# Patient Record
Sex: Female | Born: 1970 | Race: White | Hispanic: No | Smoking: Never smoker
Health system: Southern US, Community
[De-identification: ages and names within clinical notes are randomized; demographics above are authoritative.]

## PROBLEM LIST (undated history)

## (undated) DIAGNOSIS — B019 Varicella without complication: Secondary | ICD-10-CM

## (undated) DIAGNOSIS — F419 Anxiety disorder, unspecified: Secondary | ICD-10-CM

## (undated) DIAGNOSIS — N200 Calculus of kidney: Secondary | ICD-10-CM

## (undated) DIAGNOSIS — R0602 Shortness of breath: Secondary | ICD-10-CM

## (undated) DIAGNOSIS — I471 Supraventricular tachycardia, unspecified: Secondary | ICD-10-CM

## (undated) DIAGNOSIS — I4719 Other supraventricular tachycardia: Secondary | ICD-10-CM

## (undated) DIAGNOSIS — F411 Generalized anxiety disorder: Secondary | ICD-10-CM

## (undated) DIAGNOSIS — M1990 Unspecified osteoarthritis, unspecified site: Secondary | ICD-10-CM

## (undated) DIAGNOSIS — T7840XA Allergy, unspecified, initial encounter: Secondary | ICD-10-CM

## (undated) DIAGNOSIS — Z8619 Personal history of other infectious and parasitic diseases: Secondary | ICD-10-CM

## (undated) DIAGNOSIS — R87629 Unspecified abnormal cytological findings in specimens from vagina: Secondary | ICD-10-CM

## (undated) HISTORY — DX: Calculus of kidney: N20.0

## (undated) HISTORY — DX: Allergy, unspecified, initial encounter: T78.40XA

## (undated) HISTORY — DX: Varicella without complication: B01.9

## (undated) HISTORY — DX: Unspecified abnormal cytological findings in specimens from vagina: R87.629

---
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
## 2015-03-15 ENCOUNTER — Ambulatory Visit: Admit: 2015-03-15 | Disposition: A | Discharge status: Home / Self Care | Payer: Self-pay | Admitting: General Practice

## 2015-11-28 ENCOUNTER — Ambulatory Visit (INDEPENDENT_AMBULATORY_CARE_PROVIDER_SITE_OTHER): Payer: 59 | Admitting: Nurse Practitioner

## 2015-11-28 ENCOUNTER — Encounter: Payer: Self-pay | Admitting: Nurse Practitioner

## 2015-11-28 VITALS — BP 130/80 | HR 90 | Temp 98.7°F | Ht 63.0 in | Wt 124.0 lb

## 2015-11-28 DIAGNOSIS — Z7689 Persons encountering health services in other specified circumstances: Secondary | ICD-10-CM

## 2015-11-28 DIAGNOSIS — Z87442 Personal history of urinary calculi: Secondary | ICD-10-CM | POA: Insufficient documentation

## 2015-11-28 DIAGNOSIS — Z7189 Other specified counseling: Secondary | ICD-10-CM

## 2015-11-28 DIAGNOSIS — Z0000 Encounter for general adult medical examination without abnormal findings: Secondary | ICD-10-CM | POA: Insufficient documentation

## 2015-11-28 DIAGNOSIS — L708 Other acne: Secondary | ICD-10-CM

## 2015-11-28 DIAGNOSIS — B009 Herpesviral infection, unspecified: Secondary | ICD-10-CM

## 2015-11-28 DIAGNOSIS — L709 Acne, unspecified: Secondary | ICD-10-CM | POA: Insufficient documentation

## 2015-11-28 MED ORDER — VALACYCLOVIR HCL 1 G PO TABS: 1000.0000 mg | ORAL_TABLET | Freq: Two times a day (BID) | ORAL | Status: DC

## 2015-11-28 NOTE — Assessment & Plan Note (Signed)
Pt has been seen by dermatology in past. Re-establishing with a new provider in Feb. Currently on doxycyline without help.

## 2015-11-28 NOTE — Patient Instructions (Signed)
Welcome to [facility]! Nice to meet you.   Happy New Year and follow up next year if you need anything or a physical exam.

## 2015-11-28 NOTE — Assessment & Plan Note (Signed)
Last episode 6 yrs ago. Will follow as needed

## 2015-11-28 NOTE — Assessment & Plan Note (Signed)
Chronic fever blisters. Valtrex helpful. Needs refills- sent to pharmacy. Will follow as needed.

## 2015-11-28 NOTE — Assessment & Plan Note (Signed)
Discussed acute and chronic issues. Reviewed health maintenance measures, PFSHx, and immunizations. Obtain routine records from [facility]. Labs done by [facility]

## 2015-11-28 NOTE — Progress Notes (Signed)
Patient ID: Betty Rodriguez, female    DOB: 1971-06-17  Age: 44 y.o. MRN: [id number]  CC: Establish Care   HPI [name] presents for CC of establishing care and CC of needing Valtrex refilled.   1) New Pt Info:   Immunizations-    Flu- UTD   Pap- 6/16 PAP   Mammogram 6/16  2) Chronic Problems-  Acne- Dr. Tyler Deis, Doxycyline, spironolactone- very sick    Seeing a new Dermatologist   Kidney stones- last episode 6 years ago   UTI's- in past   Allergies- [name] has a past medical history of Kidney stones; Chicken pox; and Allergy.   She has past surgical history that includes Tonsillectomy (1983).   Her family history includes Diabetes in her maternal grandfather and maternal grandmother; Heart disease in her maternal grandfather and maternal grandmother; Hyperlipidemia in her father; Hypertension in her father, maternal grandfather, and maternal grandmother; Stroke (age of onset: 63) in her maternal grandmother.She reports that she has never smoked. She does not have any smokeless tobacco history on file. She reports that she drinks alcohol. She reports that she does not use illicit drugs.  No outpatient prescriptions prior to visit.   No facility-administered medications prior to visit.    ROS Review of Systems  Constitutional: Negative for fever, chills, diaphoresis and fatigue.  Respiratory: Negative for chest tightness, shortness of breath and wheezing.   Cardiovascular: Negative for chest pain, palpitations and leg swelling.  Gastrointestinal: Negative for nausea, vomiting and diarrhea.  Skin: Negative for rash.  Neurological: Negative for dizziness, weakness, numbness and headaches.  Psychiatric/Behavioral: The patient is not nervous/anxious.    Objective:  BP 130/80 mmHg  Pulse 90  Temp(Src) 98.7 F (37.1 C) (Oral)  Ht 5' 3"  (1.6 m)  Wt 124 lb (56.246 kg)  BMI 21.97 kg/m2  LMP 11/26/2015 (Exact  Date)  Physical Exam  Constitutional: She is oriented to person, place, and time. She appears well-developed and well-nourished. No distress.  HENT:  Head: Normocephalic and atraumatic.  Right Ear: External ear normal.  Left Ear: External ear normal.  Acne  Eyes: Right eye exhibits no discharge. Left eye exhibits no discharge. No scleral icterus.  Cardiovascular: Normal rate, regular rhythm and normal heart sounds.  Exam reveals no gallop and no friction rub.   No murmur heard. Pulmonary/Chest: Effort normal and breath sounds normal. No respiratory distress. She has no wheezes. She has no rales. She exhibits no tenderness.  Neurological: She is alert and oriented to person, place, and time. No cranial nerve deficit. She exhibits normal muscle tone. Coordination normal.  Skin: Skin is warm and dry. No rash noted. She is not diaphoretic.  Psychiatric: She has a normal mood and affect. Her behavior is normal. Judgment and thought content normal.      Assessment & Plan:   [name] was seen today for establish care.  Diagnoses and all orders for this visit:  Encounter to establish care  HSV-1 (herpes simplex virus 1) infection  Other orders -     valACYclovir (VALTREX) 1000 MG tablet; Take 1 tablet (1,000 mg total) by mouth 2 (two) times daily. for 3 days as needed   I have changed Ms. Greaser valACYclovir. I am also having her maintain her JUNEL FE 1/20 and doxycycline.  Meds ordered this encounter  Medications  . JUNEL FE 1/20 1-20 MG-MCG tablet    Sig: Take 1 tablet by mouth daily.  Marland Kitchen  DISCONTD: valACYclovir (VALTREX) 1000 MG tablet    Sig: 1,000 mg. 1 tab twice daily per day for 3 days as needed  . doxycycline (DORYX) 100 MG EC tablet    Sig: Take 100 mg by mouth daily.  . valACYclovir (VALTREX) 1000 MG tablet    Sig: Take 1 tablet (1,000 mg total) by mouth 2 (two) times daily. for 3 days as needed    Dispense:  20 tablet    Refill:  2    Order Specific Question:   Supervising Provider    Answer:  Crecencio Mc [[id number]]     Follow-up: Return if symptoms worsen or fail to improve.

## 2015-11-28 NOTE — Progress Notes (Signed)
Pre visit review using our clinic review tool, if applicable. No additional management support is needed unless otherwise documented below in the visit note. 

## 2016-02-13 ENCOUNTER — Encounter: Payer: Self-pay | Admitting: Nurse Practitioner

## 2016-02-13 ENCOUNTER — Ambulatory Visit (INDEPENDENT_AMBULATORY_CARE_PROVIDER_SITE_OTHER): Payer: 59 | Admitting: Nurse Practitioner

## 2016-02-13 VITALS — BP 132/84 | HR 100 | Temp 98.9°F | Wt 123.0 lb

## 2016-02-13 DIAGNOSIS — L708 Other acne: Secondary | ICD-10-CM

## 2016-02-13 DIAGNOSIS — F411 Generalized anxiety disorder: Secondary | ICD-10-CM | POA: Diagnosis not present

## 2016-02-13 MED ORDER — BUSPIRONE HCL 7.5 MG PO TABS: 7.5000 mg | ORAL_TABLET | Freq: Three times a day (TID) | ORAL | Status: DC

## 2016-02-13 NOTE — Patient Instructions (Signed)
See you in 4 weeks.

## 2016-02-13 NOTE — Progress Notes (Signed)
Pre visit review using our clinic review tool, if applicable. No additional management support is needed unless otherwise documented below in the visit note. 

## 2016-02-13 NOTE — Progress Notes (Signed)
Patient ID: Betty Rodriguez, female    DOB: May 12, 1971  Age: 45 y.o. MRN: [id number]  CC: Anxiety   HPI Betty Rodriguez presents for CC of anxiety x 6 months.   1) Pt reports seeing a dermatologist for a new treatment  Rosacea fulminans  Starting in April on Isotretoin   2) Anxiety pt started a new job 6 months ago and has increased stress with panic attacks daily x 4 months with heart racing, irritability, excessive worry/fear, lasting 15 minutes to 1 hr.  Treatment to date: Nothing [place] once a month to a spa   Using natural remedies like relaxation techniques   Sleep has been problematic- trouble falling and staying asleep    Working 12-13 hrs 3rd shift 5 days a week   History Betty Rodriguez has a past medical history of Kidney stones; Chicken pox; and Allergy.   She has past surgical history that includes Tonsillectomy (1983).   Her family history includes Diabetes in her maternal grandfather and maternal grandmother; Heart disease in her maternal grandfather and maternal grandmother; Hyperlipidemia in her father; Hypertension in her father, maternal grandfather, and maternal grandmother; Stroke (age of onset: 87) in her maternal grandmother.She reports that she has never smoked. She does not have any smokeless tobacco history on file. She reports that she drinks alcohol. She reports that she does not use illicit drugs.  Outpatient Prescriptions Prior to Visit  Medication Sig Dispense Refill  . JUNEL FE 1/20 1-20 MG-MCG tablet Take 1 tablet by mouth daily.    . valACYclovir (VALTREX) 1000 MG tablet Take 1 tablet (1,000 mg total) by mouth 2 (two) times daily. for 3 days as needed 20 tablet 2  . doxycycline (DORYX) 100 MG EC tablet Take 100 mg by mouth daily.     No facility-administered medications prior to visit.    ROS Review of Systems  Constitutional: Negative for fever, chills, diaphoresis and fatigue.  Respiratory: Negative for chest tightness, shortness of breath  and wheezing.   Cardiovascular: Negative for chest pain, palpitations and leg swelling.  Gastrointestinal: Negative for nausea, vomiting and diarrhea.  Skin: Negative for rash.  Neurological: Negative for dizziness, weakness, numbness and headaches.  Psychiatric/Behavioral: Positive for sleep disturbance. The patient is nervous/anxious.     Objective:  BP 132/84 mmHg  Pulse 100  Temp(Src) 98.9 F (37.2 C) (Oral)  Wt 123 lb (55.792 kg)  SpO2 99%  LMP 01/30/2016  Physical Exam  Constitutional: She is oriented to person, place, and time. She appears well-developed and well-nourished. No distress.  HENT:  Head: Normocephalic and atraumatic.  Right Ear: External ear normal.  Left Ear: External ear normal.  Cardiovascular: Normal rate, regular rhythm and normal heart sounds.   Pulmonary/Chest: Effort normal and breath sounds normal. No respiratory distress. She has no wheezes. She has no rales. She exhibits no tenderness.  Neurological: She is alert and oriented to person, place, and time.  Skin: Skin is warm and dry. No rash noted. She is not diaphoretic.  Cystic acne of face was diagnosed Rosacea fulminans  Psychiatric: She has a normal mood and affect. Her behavior is normal. Judgment and thought content normal.   Assessment & Plan:   Islam was seen today for anxiety.  Diagnoses and all orders for this visit:  Other acne  Generalized anxiety disorder  Other orders -     busPIRone (BUSPAR) 7.5 MG tablet; Take 1 tablet (7.5 mg total) by mouth 3 (three) times daily.  I have discontinued Ms.  Dsouza's doxycycline. I am also having her start on busPIRone. Additionally, I am having her maintain her JUNEL FE 1/20 and valACYclovir.  Meds ordered this encounter  Medications  . busPIRone (BUSPAR) 7.5 MG tablet    Sig: Take 1 tablet (7.5 mg total) by mouth 3 (three) times daily.    Dispense:  90 tablet    Refill:  0    Order Specific Question:  Supervising Provider    Answer:   Crecencio Mc [[id number]]     Follow-up: Return in about 4 weeks (around 03/12/2016) for Fu on medications .

## 2016-02-21 DIAGNOSIS — F411 Generalized anxiety disorder: Secondary | ICD-10-CM | POA: Insufficient documentation

## 2016-02-21 NOTE — Assessment & Plan Note (Signed)
New problem to me After discussion of anxiety- likely job related- Pt would like to move forward with buspirone slowly upped to 3 x daily.  FU in 4 weeks   Discussed risks, benefits, potential side effects

## 2016-02-21 NOTE — Assessment & Plan Note (Signed)
New treatment starting soon  Rosacea fulminans was diagnosed  Pt looking forward to this

## 2016-03-10 ENCOUNTER — Other Ambulatory Visit: Payer: Self-pay | Admitting: Nurse Practitioner

## 2016-03-10 NOTE — Telephone Encounter (Signed)
Rx refill sent to pharmacy. 

## 2016-03-12 ENCOUNTER — Encounter: Payer: Self-pay | Admitting: Nurse Practitioner

## 2016-03-12 ENCOUNTER — Ambulatory Visit (INDEPENDENT_AMBULATORY_CARE_PROVIDER_SITE_OTHER): Payer: 59 | Admitting: Nurse Practitioner

## 2016-03-12 VITALS — BP 122/66 | HR 78 | Temp 98.1°F | Resp 12 | Ht 63.0 in | Wt 124.8 lb

## 2016-03-12 DIAGNOSIS — F411 Generalized anxiety disorder: Secondary | ICD-10-CM

## 2016-03-12 DIAGNOSIS — L708 Other acne: Secondary | ICD-10-CM

## 2016-03-12 MED ORDER — BUSPIRONE HCL 10 MG PO TABS
10.0000 mg | ORAL_TABLET | Freq: Three times a day (TID) | ORAL | Status: DC
Start: 2016-03-12 — End: 2016-04-06

## 2016-03-12 NOTE — Progress Notes (Signed)
Patient ID: Betty Rodriguez, female    DOB: [birth year]  Age: 45 y.o. MRN: [id number]  CC: Follow-up   HPI [facility] [place] presents for follow up of medications.   1) Drying of skin, no other affects. Started on Accutane with dermatologist  2) Decreasing in anxiety within the last week  Not more fatigued than usual  Patient was last seen on 02/13/2016 by myself to discuss anxiety medication Her job stress has improved slightly since the last visit She has opted to 3 times daily and would like to try the next dosage up   History Camaryn has a past medical history of Kidney stones; Chicken pox; and Allergy.   She has past surgical history that includes Tonsillectomy (1983).   Her family history includes Diabetes in her maternal grandfather and maternal grandmother; Heart disease in her maternal grandfather and maternal grandmother; Hyperlipidemia in her father; Hypertension in her father, maternal grandfather, and maternal grandmother; Stroke (age of onset: 21) in her maternal grandmother.She reports that she has never smoked. She does not have any smokeless tobacco history on file. She reports that she drinks alcohol. She reports that she does not use illicit drugs.  Outpatient Prescriptions Prior to Visit  Medication Sig Dispense Refill  . JUNEL FE 1/20 1-20 MG-MCG tablet Take 1 tablet by mouth daily.    . valACYclovir (VALTREX) 1000 MG tablet Take 1 tablet (1,000 mg total) by mouth 2 (two) times daily. for 3 days as needed 20 tablet 2  . busPIRone (BUSPAR) 7.5 MG tablet TAKE 1 TABLET (7.5 MG TOTAL) BY MOUTH 3 (THREE) TIMES DAILY. 90 tablet 0   No facility-administered medications prior to visit.    ROS Review of Systems  Constitutional: Negative for fever, chills, diaphoresis, activity change, appetite change, fatigue and unexpected weight change.  Eyes: Negative for visual disturbance.  Respiratory: Negative for chest tightness and shortness of breath.   Cardiovascular:  Negative for chest pain.  Gastrointestinal: Negative for nausea, vomiting and diarrhea.  Neurological: Negative for headaches.  Psychiatric/Behavioral: Negative for suicidal ideas and sleep disturbance. The patient is nervous/anxious.     Objective:  BP 122/66 mmHg  Pulse 78  Temp(Src) 98.1 F (36.7 C) (Oral)  Resp 12  Ht 5' 3"  (1.6 m)  Wt 124 lb 12.8 oz (56.609 kg)  BMI 22.11 kg/m2  SpO2 98%  LMP 01/30/2016  Physical Exam  Constitutional: She is oriented to person, place, and time. She appears well-developed and well-nourished. No distress.  HENT:  Head: Normocephalic and atraumatic.  Right Ear: External ear normal.  Left Ear: External ear normal.  Cardiovascular: Normal rate, regular rhythm and intact distal pulses.   Pulmonary/Chest: Effort normal and breath sounds normal. No respiratory distress. She has no wheezes. She has no rales. She exhibits no tenderness.  Neurological: She is alert and oriented to person, place, and time.  Skin: Skin is warm and dry. No rash noted. She is not diaphoretic.  Psychiatric: She has a normal mood and affect. Her behavior is normal. Judgment and thought content normal.  Patient had good eye contact, dressed appropriately, not tearful during today's visit   Assessment & Plan:   Merelyn was seen today for follow-up.  Diagnoses and all orders for this visit:  Other acne  Generalized anxiety disorder  Other orders -     busPIRone (BUSPAR) 10 MG tablet; Take 1 tablet (10 mg total) by mouth 3 (three) times daily.   I have discontinued Ms. Bonneville busPIRone. I am also  having her start on busPIRone. Additionally, I am having her maintain her JUNEL FE 1/20, valACYclovir, and ISOtretinoin.  Meds ordered this encounter  Medications  . ISOtretinoin (ACCUTANE) 40 MG capsule    Sig: Take 40 mg by mouth daily.  . busPIRone (BUSPAR) 10 MG tablet    Sig: Take 1 tablet (10 mg total) by mouth 3 (three) times daily.    Dispense:  90 tablet     Refill:  0    Order Specific Question:  Supervising Provider    Answer:  Crecencio Mc [[id number]]     Follow-up: Return if symptoms worsen or fail to improve.

## 2016-03-12 NOTE — Progress Notes (Signed)
Pre visit review using our clinic review tool, if applicable. No additional management support is needed unless otherwise documented below in the visit note. 

## 2016-03-12 NOTE — Patient Instructions (Addendum)
Try the new dosage up to 3 times daily.   Follow up via mychart to see if this is helpful.

## 2016-03-22 NOTE — Assessment & Plan Note (Addendum)
Buspirone was up to 10 mg from 7.5 mg asked her to slowly titrate back up to 3 times daily as needed Follow-up as needed Asked her to sign up for my chart and send a message about how she's doing

## 2016-03-22 NOTE — Assessment & Plan Note (Addendum)
Patient has started on Accutane by dermatologist Diagnosis rosacea fulminans Patient has the dryness and irritation, but feels that it has improved in inflammation. She will continue to follow-up with dermatology

## 2016-04-02 ENCOUNTER — Encounter: Payer: Self-pay | Admitting: Nurse Practitioner

## 2016-04-06 ENCOUNTER — Other Ambulatory Visit: Payer: Self-pay | Admitting: Nurse Practitioner

## 2016-04-06 NOTE — Telephone Encounter (Signed)
Patient stated working well needs refill. Please advise?

## 2016-04-07 ENCOUNTER — Other Ambulatory Visit: Payer: Self-pay | Admitting: Nurse Practitioner

## 2016-04-07 MED ORDER — BUSPIRONE HCL 10 MG PO TABS: 10.0000 mg | ORAL_TABLET | Freq: Three times a day (TID) | ORAL | Status: DC

## 2016-04-08 ENCOUNTER — Other Ambulatory Visit: Payer: Self-pay | Admitting: Nurse Practitioner

## 2016-06-15 ENCOUNTER — Other Ambulatory Visit: Payer: Self-pay | Admitting: Obstetrics & Gynecology

## 2016-06-15 DIAGNOSIS — N63 Unspecified lump in unspecified breast: Secondary | ICD-10-CM

## 2016-06-15 DIAGNOSIS — Z1239 Encounter for other screening for malignant neoplasm of breast: Secondary | ICD-10-CM

## 2016-06-15 DIAGNOSIS — Z1231 Encounter for screening mammogram for malignant neoplasm of breast: Secondary | ICD-10-CM

## 2016-06-23 ENCOUNTER — Inpatient Hospital Stay
Admission: RE | Admit: 2016-06-23 | Discharge: 2016-06-23 | Disposition: A | Discharge status: Home / Self Care | Payer: Self-pay | Source: Ambulatory Visit | Attending: *Deleted | Admitting: *Deleted

## 2016-06-23 ENCOUNTER — Other Ambulatory Visit: Payer: Self-pay | Admitting: *Deleted

## 2016-06-23 DIAGNOSIS — Z9289 Personal history of other medical treatment: Secondary | ICD-10-CM

## 2016-06-24 ENCOUNTER — Ambulatory Visit
Admission: RE | Admit: 2016-06-24 | Discharge: 2016-06-24 | Disposition: A | Discharge status: Home / Self Care | Payer: 59 | Source: Ambulatory Visit | Attending: Obstetrics & Gynecology | Admitting: Obstetrics & Gynecology

## 2016-06-24 DIAGNOSIS — N63 Unspecified lump in unspecified breast: Secondary | ICD-10-CM

## 2016-06-24 DIAGNOSIS — Z1239 Encounter for other screening for malignant neoplasm of breast: Secondary | ICD-10-CM | POA: Diagnosis present

## 2016-06-25 ENCOUNTER — Inpatient Hospital Stay
Admission: RE | Admit: 2016-06-25 | Discharge: 2016-06-25 | Disposition: A | Discharge status: Home / Self Care | Payer: Self-pay | Source: Ambulatory Visit | Attending: *Deleted | Admitting: *Deleted

## 2016-06-25 ENCOUNTER — Other Ambulatory Visit: Payer: Self-pay | Admitting: *Deleted

## 2016-06-25 DIAGNOSIS — Z9289 Personal history of other medical treatment: Secondary | ICD-10-CM

## 2016-06-30 ENCOUNTER — Ambulatory Visit: Payer: 59

## 2016-06-30 ENCOUNTER — Other Ambulatory Visit: Payer: Self-pay | Admitting: Obstetrics & Gynecology

## 2016-06-30 DIAGNOSIS — N6310 Unspecified lump in the right breast, unspecified quadrant: Secondary | ICD-10-CM

## 2016-07-06 ENCOUNTER — Ambulatory Visit
Admission: RE | Admit: 2016-07-06 | Discharge: 2016-07-06 | Disposition: A | Discharge status: Home / Self Care | Payer: 59 | Source: Ambulatory Visit | Attending: Obstetrics & Gynecology | Admitting: Obstetrics & Gynecology

## 2016-07-06 DIAGNOSIS — N6310 Unspecified lump in the right breast, unspecified quadrant: Secondary | ICD-10-CM

## 2016-07-06 DIAGNOSIS — N6021 Fibroadenosis of right breast: Secondary | ICD-10-CM | POA: Insufficient documentation

## 2016-07-06 DIAGNOSIS — N63 Unspecified lump in breast: Secondary | ICD-10-CM | POA: Diagnosis present

## 2016-07-06 HISTORY — PX: BREAST BIOPSY: SHX20

## 2016-07-07 LAB — SURGICAL PATHOLOGY

## 2016-10-14 ENCOUNTER — Other Ambulatory Visit: Payer: Self-pay | Admitting: Nurse Practitioner

## 2016-10-14 NOTE — Telephone Encounter (Signed)
Last OV with Betty Rodriguez on 03/12/16. Has not established care with another provider; no appt set up.

## 2016-11-11 ENCOUNTER — Other Ambulatory Visit: Payer: Self-pay | Admitting: Family Medicine

## 2016-11-11 NOTE — Telephone Encounter (Signed)
Pt was a pt of Lorane Gell, NP. Pt has been taking Buspirone and was last refilled on 10/14/16. Pt has had no labs done or no future appt to est care with new PCP. Pt was last seen by Lorane Gell on 11/28/15.

## 2016-12-09 ENCOUNTER — Other Ambulatory Visit: Payer: Self-pay | Admitting: Family Medicine

## 2017-01-02 ENCOUNTER — Other Ambulatory Visit: Payer: Self-pay | Admitting: Family Medicine

## 2017-03-06 ENCOUNTER — Other Ambulatory Visit: Payer: Self-pay | Admitting: Family Medicine

## 2017-04-06 ENCOUNTER — Other Ambulatory Visit: Payer: Self-pay | Admitting: Family Medicine

## 2017-04-07 NOTE — Telephone Encounter (Signed)
Refilled: 01/04/17 Last OV: 03/12/16 Last Labs: none Future OV: 04/13/17 Please advise?

## 2017-04-13 ENCOUNTER — Ambulatory Visit: Payer: 59 | Admitting: Family

## 2017-04-14 ENCOUNTER — Encounter: Payer: Self-pay | Admitting: Family

## 2017-04-15 NOTE — Telephone Encounter (Signed)
Left message for patient to return call back.  

## 2017-04-19 ENCOUNTER — Encounter: Payer: Self-pay | Admitting: Family

## 2017-04-19 ENCOUNTER — Ambulatory Visit (INDEPENDENT_AMBULATORY_CARE_PROVIDER_SITE_OTHER): Payer: 59 | Admitting: Family

## 2017-04-19 VITALS — BP 158/84 | HR 80 | Temp 98.1°F | Resp 16 | Ht 63.25 in | Wt 113.1 lb

## 2017-04-19 DIAGNOSIS — B009 Herpesviral infection, unspecified: Secondary | ICD-10-CM

## 2017-04-19 DIAGNOSIS — I10 Essential (primary) hypertension: Secondary | ICD-10-CM

## 2017-04-19 DIAGNOSIS — F411 Generalized anxiety disorder: Secondary | ICD-10-CM

## 2017-04-19 DIAGNOSIS — I159 Secondary hypertension, unspecified: Secondary | ICD-10-CM | POA: Diagnosis not present

## 2017-04-19 MED ORDER — SERTRALINE HCL 50 MG PO TABS: 50.0000 mg | ORAL_TABLET | Freq: Every day | ORAL | 3 refills | Status: DC

## 2017-04-19 MED ORDER — AMLODIPINE BESYLATE 2.5 MG PO TABS: 2.5000 mg | ORAL_TABLET | Freq: Every day | ORAL | 3 refills | Status: DC

## 2017-04-19 MED ORDER — VALACYCLOVIR HCL 1 G PO TABS: 1000.0000 mg | ORAL_TABLET | Freq: Two times a day (BID) | ORAL | 2 refills | Status: DC

## 2017-04-19 NOTE — Assessment & Plan Note (Signed)
Uncontrolled. 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 Stop buspar. Trial zoloft. Advised otc melatonin and unisom. F/u 6-8 weeks.

## 2017-04-19 NOTE — Assessment & Plan Note (Signed)
Stable. Refilled medication.

## 2017-04-19 NOTE — Addendum Note (Signed)
Addended by: Leeanne Rio on: 04/19/2017 04:20 PM   Modules accepted: Orders

## 2017-04-19 NOTE — Assessment & Plan Note (Signed)
Elevated. No headache today. Patient and I jointly decided to go ahead and start low-dose amlodipine. Patient will follow-up in 6-8 weeks. Sooner, if headache turns, and advised patient to let me know asap.

## 2017-04-19 NOTE — Patient Instructions (Addendum)
Melatonin- no more than 1 to 3 mg should be used, taken 30 minutes prior to the desired onset of sleep   Unisom OTC  Trial zoloft - take before sleep  Follow up 6-8 weeks  Let me know about sleep study and if you want to pursue

## 2017-04-19 NOTE — Progress Notes (Signed)
Subjective:    Patient ID: Betty Rodriguez, female    DOB: 1971/06/21, 46 y.o.   MRN: [id number]  CC: Betty Rodriguez is a 46 y.o. female who presents today for follow up.   HPI: Anxiety- for past 5-6 years. has been on  Buspar 'helped some in the beginning.' has panic attacks. No depression . Works 3rd shift and job is very stressful. Cannot sleep, very erratic, and wakes up thinking about work. approx 4-5 hours sleep per day. avoiding social situations. No thoughts of hurting herself or anyone else.   Told she snores. Declines sleep study at this time.   HTN- has been checking at work because had a few headaches; taken BP during HA which was 136/82, 145/89. Describes HA as across forehead and 'pressure'; no photophobia, vision changes, chest pain during HA. Denies exertional chest pain or pressure, numbness or tingling radiating to left arm or jaw, palpitations, dizziness, frequent headaches, changes in vision, or shortness of breath.  Not eating more salt. Intentionally lost weight due to healthier eating.     Pap June 2017- had been with [facility].    Needs refill for valtrex for occasional cold sore.        HISTORY:  Past Medical History:  Diagnosis Date  . Allergy   . Chicken pox   . Kidney stones    Past Surgical History:  Procedure Laterality Date  . BREAST BIOPSY Right 07/06/2016   us/ core  . TONSILLECTOMY  1983   Family History  Problem Relation Age of Onset  . Hyperlipidemia Father   . Hypertension Father   . Diabetes Maternal Grandmother   . Hypertension Maternal Grandmother   . Heart disease Maternal Grandmother   . Stroke Maternal Grandmother 31  . Diabetes Maternal Grandfather   . Hypertension Maternal Grandfather   . Heart disease Maternal Grandfather     Allergies: Sulfa antibiotics Current Outpatient Prescriptions on File Prior to Visit  Medication Sig Dispense Refill  . JUNEL FE 1/20 1-20 MG-MCG tablet Take 1 tablet by mouth daily.      No current facility-administered medications on file prior to visit.     Social History  Substance Use Topics  . Smoking status: Never Smoker  . Smokeless tobacco: Never Used  . Alcohol use 0.0 oz/week     Comment: Rare     Review of Systems  Constitutional: Negative for chills and fever.  Eyes: Negative for visual disturbance.  Respiratory: Negative for cough.   Cardiovascular: Negative for chest pain and palpitations.  Gastrointestinal: Negative for nausea and vomiting.  Neurological: Positive for headaches. Negative for dizziness.  Psychiatric/Behavioral: Positive for sleep disturbance. Negative for suicidal ideas. The patient is nervous/anxious.       Objective:    BP (!) 158/84   Pulse 80   Temp 98.1 F (36.7 C) (Oral)   Resp 16   Ht 5' 3.25" (1.607 m)   Wt 113 lb 2 oz (51.3 kg)   SpO2 98%   BMI 19.88 kg/m  BP Readings from Last 3 Encounters:  04/19/17 (!) 158/84  03/12/16 122/66  02/13/16 132/84   Wt Readings from Last 3 Encounters:  04/19/17 113 lb 2 oz (51.3 kg)  03/12/16 124 lb 12.8 oz (56.6 kg)  02/13/16 123 lb (55.8 kg)    Physical Exam  Constitutional: She appears well-developed and well-nourished.  Eyes: Conjunctivae are normal.  Cardiovascular: Normal rate, regular rhythm, normal heart sounds and normal pulses.   Pulmonary/Chest: Effort normal  and breath sounds normal. She has no wheezes. She has no rhonchi. She has no rales.  Neurological: She is alert.  Skin: Skin is warm and dry.  Psychiatric: She has a normal mood and affect. Her speech is normal and behavior is normal. Thought content normal.  Vitals reviewed.      Assessment & Plan:   Problem List Items Addressed This Visit      Cardiovascular and Mediastinum   Hypertension - Primary    Elevated. No headache today. Patient and I jointly decided to go ahead and start low-dose amlodipine. Patient will follow-up in 6-8 weeks. Sooner, if headache turns, and advised patient to let me  know asap.       Relevant Medications   amLODipine (NORVASC) 2.5 MG tablet   Other Relevant Orders   Basic metabolic panel     Other   HSV-1 (herpes simplex virus 1) infection    Stable. Refilled medication.       Relevant Medications   valACYclovir (VALTREX) 1000 MG tablet   Generalized anxiety disorder    Uncontrolled. 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 Stop buspar. Trial zoloft. Advised otc melatonin and unisom. F/u 6-8 weeks.       Relevant Medications   sertraline (ZOLOFT) 50 MG tablet       I have discontinued Ms. Tassin ISOtretinoin, busPIRone, and busPIRone. I am also having her start on amLODipine and sertraline. Additionally, I am having her maintain her JUNEL FE 1/20, loratadine, fluticasone, and valACYclovir.   Meds ordered this encounter  Medications  . loratadine (CLARITIN REDITABS) 10 MG dissolvable tablet    Sig: Take 10 mg by mouth daily.  . fluticasone (FLONASE) 50 MCG/ACT nasal spray    Sig: Place 1 spray into both nostrils daily.  Marland Kitchen amLODipine (NORVASC) 2.5 MG tablet    Sig: Take 1 tablet (2.5 mg total) by mouth daily.    Dispense:  90 tablet    Refill:  3    Order Specific Question:   Supervising Provider    Answer:   Deborra Medina L [[id number]]  . sertraline (ZOLOFT) 50 MG tablet    Sig: Take 1 tablet (50 mg total) by mouth at bedtime.    Dispense:  90 tablet    Refill:  3    Order Specific Question:   Supervising Provider    Answer:   Deborra Medina L [[id number]]  . valACYclovir (VALTREX) 1000 MG tablet    Sig: Take 1 tablet (1,000 mg total) by mouth 2 (two) times daily. for 3 days as needed    Dispense:  20 tablet    Refill:  2    Order Specific Question:   Supervising Provider    Answer:   Crecencio Mc [[id number]]    Return precautions given.   Risks, benefits, and alternatives of the medications and treatment plan prescribed today were discussed, and patient expressed understanding.   Education regarding symptom management and diagnosis  given to patient on AVS.  Continue to follow with Burnard Hawthorne, FNP for routine health maintenance.   [facility] and I agreed with plan.   Mable Paris, FNP

## 2017-04-20 ENCOUNTER — Encounter: Payer: Self-pay | Admitting: Emergency Medicine

## 2017-04-20 ENCOUNTER — Observation Stay
Admission: EM | Admit: 2017-04-20 | Discharge: 2017-04-21 | Disposition: A | Discharge status: Home / Self Care | Payer: 59 | Attending: Internal Medicine | Admitting: Internal Medicine

## 2017-04-20 DIAGNOSIS — F411 Generalized anxiety disorder: Secondary | ICD-10-CM | POA: Diagnosis present

## 2017-04-20 DIAGNOSIS — I10 Essential (primary) hypertension: Secondary | ICD-10-CM | POA: Diagnosis present

## 2017-04-20 DIAGNOSIS — I471 Supraventricular tachycardia, unspecified: Secondary | ICD-10-CM

## 2017-04-20 DIAGNOSIS — Z882 Allergy status to sulfonamides status: Secondary | ICD-10-CM | POA: Diagnosis not present

## 2017-04-20 DIAGNOSIS — T43225A Adverse effect of selective serotonin reuptake inhibitors, initial encounter: Secondary | ICD-10-CM | POA: Diagnosis not present

## 2017-04-20 DIAGNOSIS — T782XXA Anaphylactic shock, unspecified, initial encounter: Secondary | ICD-10-CM

## 2017-04-20 DIAGNOSIS — Y92009 Unspecified place in unspecified non-institutional (private) residence as the place of occurrence of the external cause: Secondary | ICD-10-CM | POA: Insufficient documentation

## 2017-04-20 DIAGNOSIS — E876 Hypokalemia: Secondary | ICD-10-CM | POA: Diagnosis not present

## 2017-04-20 DIAGNOSIS — Z87442 Personal history of urinary calculi: Secondary | ICD-10-CM | POA: Insufficient documentation

## 2017-04-20 DIAGNOSIS — Z79899 Other long term (current) drug therapy: Secondary | ICD-10-CM | POA: Insufficient documentation

## 2017-04-20 DIAGNOSIS — Z8249 Family history of ischemic heart disease and other diseases of the circulatory system: Secondary | ICD-10-CM | POA: Insufficient documentation

## 2017-04-20 DIAGNOSIS — R000 Tachycardia, unspecified: Secondary | ICD-10-CM

## 2017-04-20 HISTORY — DX: Anxiety disorder, unspecified: F41.9

## 2017-04-20 HISTORY — DX: Supraventricular tachycardia, unspecified: I47.10

## 2017-04-20 HISTORY — DX: Supraventricular tachycardia: I47.1

## 2017-04-20 LAB — BASIC METABOLIC PANEL
Anion gap: 10 (ref 5–15)
BUN: 15 mg/dL (ref 6–20)
CO2: 22 mmol/L (ref 22–32)
Calcium: 8.9 mg/dL (ref 8.9–10.3)
Chloride: 102 mmol/L (ref 101–111)
Creatinine, Ser: 0.81 mg/dL (ref 0.44–1.00)
GFR calc Af Amer: >60 mL/min (ref >60)
GFR calc non Af Amer: >60 mL/min (ref >60)
Glucose, Bld: 223 mg/dL — ABNORMAL HIGH (ref 65–99)
Potassium: 3.3 mmol/L — ABNORMAL LOW (ref 3.5–5.1)
Sodium: 134 mmol/L — ABNORMAL LOW (ref 135–145)

## 2017-04-20 LAB — CBC WITH DIFFERENTIAL/PLATELET
Basophils Absolute: 0 10*3/uL (ref 0–0.1)
Basophils Relative: 0 %
Eosinophils Absolute: 0 10*3/uL (ref 0–0.7)
Eosinophils Relative: 0 %
HCT: 38.4 % (ref 35.0–47.0)
Hemoglobin: 13.4 g/dL (ref 12.0–16.0)
Lymphocytes Relative: 17 %
Lymphs Abs: 1.3 10*3/uL (ref 1.0–3.6)
MCH: 30.7 pg (ref 26.0–34.0)
MCHC: 34.8 g/dL (ref 32.0–36.0)
MCV: 88.1 fL (ref 80.0–100.0)
Monocytes Absolute: 0.2 10*3/uL (ref 0.2–0.9)
Monocytes Relative: 2 %
Neutro Abs: 6.3 10*3/uL (ref 1.4–6.5)
Neutrophils Relative %: 81 %
Platelets: 258 10*3/uL (ref 150–440)
RBC: 4.37 MIL/uL (ref 3.80–5.20)
RDW: 12.9 % (ref 11.5–14.5)
WBC: 7.8 10*3/uL (ref 3.6–11.0)

## 2017-04-20 MED ORDER — SODIUM CHLORIDE 0.9 % IV BOLUS (SEPSIS)
1000.0000 mL | Freq: Once | INTRAVENOUS | Status: AC
  Administered 2017-04-20: 1000 mL via INTRAVENOUS

## 2017-04-20 MED ORDER — LORAZEPAM 2 MG/ML IJ SOLN
1.0000 mg | Freq: Once | INTRAMUSCULAR | Status: AC
  Administered 2017-04-20: 1 mg via INTRAVENOUS

## 2017-04-20 MED ORDER — LORAZEPAM 2 MG/ML IJ SOLN
INTRAMUSCULAR | Status: AC
  Filled 2017-04-20: qty 1

## 2017-04-20 MED ORDER — LORAZEPAM 2 MG/ML IJ SOLN
INTRAMUSCULAR | Status: AC
  Administered 2017-04-20: 1 mg via INTRAVENOUS
  Filled 2017-04-20: qty 1

## 2017-04-20 NOTE — ED Notes (Signed)
Pts HR 160, MD Schaevitz informed, verbal order for 1 mg ativan given. Pt A/OX4, resp clear.

## 2017-04-20 NOTE — ED Notes (Addendum)
Pt's HR shot up to 160s, EDP notified and VO received for ativan 77m IV.  Pt skin red and blotching, pt diaphoretic and labored breathing at this time.  Pt states and family member at bedside states that pt has history of anxiety.

## 2017-04-20 NOTE — ED Notes (Addendum)
Pt reports to ED w/ c/o rash, rapid HR and "feeling off".  Pt sts that she was put on two new medications, zoloft and amlodipine. Pt sts that she took 2 doses of zoloft and 1 of amlodipine.  Pt sts that she developed rash to back/chest and went to urgent care. Sts that she was given shot of benadryl and steroids. Sts that rash and "funy feeling" decr after urgent care visit but returned PTA.  Pt also sts felt like throat "tight", resp even and unlabored and able to speak in full sentences w/ issue

## 2017-04-20 NOTE — ED Triage Notes (Signed)
Patient ambulatory to triage with steady gait, without difficulty or distress noted; pt st rx zoloft and amlodipine yesterday by PCP; today went to urgent care for ?reaction; rx steroids and benadryl injection at 730pm; c/o persistent "tingling sensation and throat feels tight"; pt tachycardic in triage 140's, reports her HR was 150 at urgent care as well

## 2017-04-20 NOTE — ED Provider Notes (Signed)
[facility] Emergency Department Provider Note  ____________________________________________   First MD Initiated Contact with Patient 04/20/17 2140     (approximate)  I have reviewed the triage vital signs and the nursing notes.   HISTORY  Chief Complaint Allergic Reaction   HPI Flynn Lininger Gravelle is a 46 y.o. female Who was recently prescribed Zoloft as well as amlodipine yesterday who began having a rash tonight at about 7:30 as well as tingling in her throat. She went to an urgent care where they diagnosed her with an allergic reaction. She was found to have heart rate of about 150 an urgent care. She was given a shot of steroids as well as Benadryl. Upon discharge, she was given a prescription for prednisone. She also took a dose of Pepcid prior to arrival. She said that she was observed less than an hour at urgent care. However, once discharged she felt a continued "tingling" in her throat and that her throat "felt tight." She then came to the emergency department for further evaluation. She described the rash as "hives" on her back.   Past Medical History:  Diagnosis Date  . Allergy   . Chicken pox   . Kidney stones     Patient Active Problem List   Diagnosis Date Noted  . Hypertension 04/19/2017  . Generalized anxiety disorder 02/21/2016  . Encounter to establish care 11/28/2015  . 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 History of nephrolithiasis 11/28/2015  . Acne 11/28/2015    Past Surgical History:  Procedure Laterality Date  . BREAST BIOPSY Right 07/06/2016   us/ core  . TONSILLECTOMY  1983    Prior to Admission medications   Medication Sig Start Date End Date Taking? Authorizing Provider  amLODipine (NORVASC) 2.5 MG tablet Take 1 tablet (2.5 mg total) by mouth daily. 04/19/17   Burnard Hawthorne, FNP  fluticasone (FLONASE) 50 MCG/ACT nasal spray Place 1 spray into both nostrils daily.    [provider]    JUNEL FE 1/20 1-20 MG-MCG tablet Take 1 tablet by mouth daily. 10/29/15   [provider]  loratadine (CLARITIN REDITABS) 10 MG dissolvable tablet Take 10 mg by mouth daily.    [provider]  sertraline (ZOLOFT) 50 MG tablet Take 1 tablet (50 mg total) by mouth at bedtime. 04/19/17   Burnard Hawthorne, FNP  valACYclovir (VALTREX) 1000 MG tablet Take 1 tablet (1,000 mg total) by mouth 2 (two) times daily. for 3 days as needed 04/19/17   Burnard Hawthorne, FNP    Allergies Sulfa antibiotics  Family History  Problem Relation Age of Onset  . Hyperlipidemia Father   . Hypertension Father   . Diabetes Maternal Grandmother   . Hypertension Maternal Grandmother   . Heart disease Maternal Grandmother   . Stroke Maternal Grandmother 75  . Diabetes Maternal Grandfather   . Hypertension Maternal Grandfather   . Heart disease Maternal Grandfather     Social History Social History  Substance Use Topics  . Smoking status: Never Smoker  . Smokeless tobacco: Never Used  . Alcohol use 0.0 oz/week     Comment: Rare     Review of Systems  Constitutional: No fever/chills Eyes: No visual changes. ENT: as above Cardiovascular: Denies chest pain. Respiratory: Denies shortness of breath. Gastrointestinal: No abdominal pain.  No nausea, no vomiting.  No diarrhea.  No constipation. Genitourinary: Negative for dysuria. Musculoskeletal: Negative for back pain. Skin: as above Neurological: Negative for headaches,  focal weakness or numbness.   ____________________________________________   PHYSICAL EXAM:  VITAL SIGNS: ED Triage Vitals  Enc Vitals Group     BP 04/20/17 2200 (!) 145/91     Pulse Rate 04/20/17 2200 (!) 105     Resp 04/20/17 2200 19     Temp --      Temp src --      SpO2 04/20/17 2200 99 %     Weight 04/20/17 2135 113 lb (51.3 kg)     Height 04/20/17 2135 5' 3"  (1.6 m)     Head Circumference --      Peak Flow --      Pain Score --      Pain Loc --       Pain Edu? --      Excl. in [place]? --     Constitutional: Alert and oriented. Well appearing and in no acute distress. Eyes: Conjunctivae are normal. PERRL. EOMI. Head: Atraumatic. Nose: No congestion/rhinnorhea. Mouth/Throat: Mucous membranes are moist.  Oropharynx non-erythematous.no swelling of the uvula tongue or tonsils. Neck: No stridor.   Cardiovascular: achycardic, regular rhythm. Grossly normal heart sounds.   Respiratory: Normal respiratory effort.  No retractions. Lungs CTAB. Gastrointestinal: Soft and nontender. No distention.  No CVA tenderness. Musculoskeletal: No lower extremity tenderness nor edema.  No joint effusions. Neurologic:  Normal speech and language. No gross focal neurologic deficits are appreciated.  Skin:  Scattered patches of erythema to the patient's thoracic back without a definitive hive-like appearance. Rash is blanchable. Also with a small amount of excoriation scattered to the back. Psychiatric: Mood and affect are normal. Speech and behavior are normal.  ____________________________________________   LABS (all labs ordered are listed, but only abnormal results are displayed)  Labs Reviewed  BASIC METABOLIC PANEL - Abnormal; Notable for the following:       Result Value   Sodium 134 (*)    Potassium 3.3 (*)    Glucose, Bld 223 (*)    All other components within normal limits  CBC WITH DIFFERENTIAL/PLATELET   ____________________________________________  EKG  ED ECG REPORT I, Doran Stabler, the attending physician, personally viewed and interpreted this ECG.   Date: 04/21/2017  EKG Time: 2134  Rate: 119  Rhythm: sinus tachycardia  Axis: normal  Intervals:none  ST&T Change: no ST segment elevation or depression. No abnormal T-wave inversion.  ____________________________________________  RADIOLOGY   ____________________________________________   PROCEDURES  Procedure(s) performed:   Procedures  Critical Care  performed:   ____________________________________________   INITIAL IMPRESSION / ASSESSMENT AND PLAN / ED COURSE  Pertinent labs & imaging results that were available during my care of the patient were reviewed by me and considered in my medical decision making (see chart for details).  ----------------------------------------- 1120 PM on 04/20/2017 -----------------------------------------  Patient given fluids as well as Ativan. Heart rate reduced to about 104 status post Ativan. However, the patient continues to have intermittent episodes where her heart rate goes up to 130. No reported worsening respiratory distress. Plan is to continue to observe the patient able given additional liter of fluid. Signed out to Dr. Kerman Passey.      ____________________________________________   FINAL CLINICAL IMPRESSION(S) / ED DIAGNOSES  Anaphylaxis   NEW MEDICATIONS STARTED DURING THIS VISIT:  New Prescriptions   No medications on file     Note:  This document was prepared using Dragon voice recognition software and may include unintentional dictation errors.    Orbie Pyo, MD 04/21/17 [phone]

## 2017-04-20 NOTE — ED Provider Notes (Addendum)
-----------------------------------------   11:57 PM on 04/20/2017 -----------------------------------------  Patient care assumed from Dr. Clearnce Hasten.  Patient presents for possible allergic reaction, with persistent tachycardia. I have personally seen and evaluated the patient. Patient states she was feeling very flushed today with redness of the chest and extremities, and went to an urgent care and was diagnosed with a possible allergic reaction. Was given a shot of Benadryl and Solu-Medrol and discharged home. Patient states she once again began feeling bad at home with redness over her chest, and feeling like her heart was racing so she came to the emergency department. Upon arrival the patient is quite tachycardic rhythm 140 bpm, received Ativan and IV fluids and her heart rate decreased to approximately 100 bpm. Shortly after I assumed care of the patient her heart rate once again spiked 160-170 bpm, patient denies any chest pain or shortness of breath but states she feels very anxious and like her heart is racing. Patient has a history of anxiety for which she takes Zoloft but denies ever being prescribed benzodiazepines in the past. Patient's labs are largely negative I have added on a troponin. We will re-dose Ativan and continue with IV fluids for close monitoring in the emergency department.  EKG reviewed and interpreted by myself shows sinus tachycardia 140 bpm, narrow QRS, normal axis, normal intervals, nonspecific ST changes without ST elevation.   Harvest Dark, MD 04/21/17 0011   ----------------------------------------- 2:01 AM on 04/21/2017 -----------------------------------------  Patient's troponin is negative. Patient's heart rate once again is increased greater than 180 bpm.  Repeat EKG reviewed and interpreted by myself shows supraventricular tachycardia 180 bpm, narrow QRS, normal axis, prolonged QTC 546 ms, nonspecific ST changes without ST elevation.  Given the  patient's intermittent continued significant tachycardia despite normal labs we will admit the patient to the hospital for further treatment. Patient's heart rate appears to go down while sleeping into the 90s however upon awakening and once again increased greater than 180.   Harvest Dark, MD 04/21/17 [phone]

## 2017-04-21 ENCOUNTER — Telehealth: Payer: Self-pay | Admitting: *Deleted

## 2017-04-21 ENCOUNTER — Encounter: Payer: Self-pay | Admitting: *Deleted

## 2017-04-21 ENCOUNTER — Telehealth: Payer: Self-pay | Admitting: Physician Assistant

## 2017-04-21 DIAGNOSIS — F411 Generalized anxiety disorder: Secondary | ICD-10-CM

## 2017-04-21 DIAGNOSIS — I471 Supraventricular tachycardia, unspecified: Secondary | ICD-10-CM

## 2017-04-21 DIAGNOSIS — R0602 Shortness of breath: Secondary | ICD-10-CM

## 2017-04-21 DIAGNOSIS — R000 Tachycardia, unspecified: Secondary | ICD-10-CM | POA: Diagnosis not present

## 2017-04-21 DIAGNOSIS — E876 Hypokalemia: Secondary | ICD-10-CM

## 2017-04-21 DIAGNOSIS — I10 Essential (primary) hypertension: Secondary | ICD-10-CM

## 2017-04-21 LAB — URINE DRUG SCREEN, QUALITATIVE (ARMC ONLY)
Amphetamines, Ur Screen: NOT DETECTED
Barbiturates, Ur Screen: NOT DETECTED
Benzodiazepine, Ur Scrn: NOT DETECTED
Cannabinoid 50 Ng, Ur ~~LOC~~: NOT DETECTED
Cocaine Metabolite,Ur ~~LOC~~: NOT DETECTED
MDMA (Ecstasy)Ur Screen: NOT DETECTED
Methadone Scn, Ur: NOT DETECTED
Opiate, Ur Screen: NOT DETECTED
Phencyclidine (PCP) Ur S: NOT DETECTED
Tricyclic, Ur Screen: NOT DETECTED

## 2017-04-21 LAB — BASIC METABOLIC PANEL
BUN/Creatinine Ratio: 16 (ref 9–23)
BUN: 14 mg/dL (ref 6–24)
CO2: 22 mmol/L (ref 18–29)
Calcium: 9 mg/dL (ref 8.7–10.2)
Chloride: 101 mmol/L (ref 96–106)
Creatinine, Ser: 0.85 mg/dL (ref 0.57–1.00)
GFR calc Af Amer: 96 mL/min/{1.73_m2} (ref >59)
GFR calc non Af Amer: 83 mL/min/{1.73_m2} (ref >59)
Glucose: 103 mg/dL — ABNORMAL HIGH (ref 65–99)
Potassium: 3.9 mmol/L (ref 3.5–5.2)
Sodium: 139 mmol/L (ref 134–144)

## 2017-04-21 LAB — URINALYSIS, ROUTINE W REFLEX MICROSCOPIC
Bacteria, UA: NONE SEEN
Bilirubin Urine: NEGATIVE
Glucose, UA: 150 mg/dL — AB
Ketones, ur: NEGATIVE mg/dL
Leukocytes, UA: NEGATIVE
Nitrite: NEGATIVE
Protein, ur: NEGATIVE mg/dL
Specific Gravity, Urine: 1.008 (ref 1.005–1.030)
pH: 7 (ref 5.0–8.0)

## 2017-04-21 LAB — TROPONIN I: Troponin I: <0.03 ng/mL (ref <0.03)

## 2017-04-21 LAB — POCT PREGNANCY, URINE: Preg Test, Ur: NEGATIVE

## 2017-04-21 LAB — MAGNESIUM: Magnesium: 1.7 mg/dL (ref 1.7–2.4)

## 2017-04-21 LAB — TSH: TSH: 0.553 u[IU]/mL (ref 0.350–4.500)

## 2017-04-21 MED ORDER — ONDANSETRON HCL 4 MG/2ML IJ SOLN: 4.0000 mg | Freq: Four times a day (QID) | INTRAMUSCULAR | Status: DC | PRN

## 2017-04-21 MED ORDER — METOPROLOL TARTRATE 25 MG PO TABS
25.0000 mg | ORAL_TABLET | Freq: Two times a day (BID) | ORAL | Status: DC
  Administered 2017-04-21: 25 mg via ORAL
  Filled 2017-04-21: qty 1

## 2017-04-21 MED ORDER — METOPROLOL TARTRATE 50 MG PO TABS: 50.0000 mg | ORAL_TABLET | Freq: Two times a day (BID) | ORAL | 0 refills | Status: DC

## 2017-04-21 MED ORDER — ACETAMINOPHEN 650 MG RE SUPP: 650.0000 mg | Freq: Four times a day (QID) | RECTAL | Status: DC | PRN

## 2017-04-21 MED ORDER — FLUTICASONE PROPIONATE 50 MCG/ACT NA SUSP
1.0000 | Freq: Every day | NASAL | Status: DC
  Administered 2017-04-21: 1 via NASAL
  Filled 2017-04-21: qty 16

## 2017-04-21 MED ORDER — LORAZEPAM 0.5 MG PO TABS: 0.5000 mg | ORAL_TABLET | Freq: Three times a day (TID) | ORAL | Status: DC | PRN

## 2017-04-21 MED ORDER — DILTIAZEM HCL 30 MG PO TABS: 30.0000 mg | ORAL_TABLET | Freq: Four times a day (QID) | ORAL | 0 refills | Status: DC | PRN

## 2017-04-21 MED ORDER — DOCUSATE SODIUM 100 MG PO CAPS
100.0000 mg | ORAL_CAPSULE | Freq: Two times a day (BID) | ORAL | Status: DC
  Administered 2017-04-21: 100 mg via ORAL
  Filled 2017-04-21: qty 1

## 2017-04-21 MED ORDER — METOPROLOL TARTRATE 25 MG PO TABS
25.0000 mg | ORAL_TABLET | Freq: Once | ORAL | Status: AC
  Administered 2017-04-21: 25 mg via ORAL
  Filled 2017-04-21: qty 1

## 2017-04-21 MED ORDER — POTASSIUM CHLORIDE CRYS ER 10 MEQ PO TBCR
30.0000 meq | EXTENDED_RELEASE_TABLET | Freq: Once | ORAL | Status: AC
  Administered 2017-04-21: 30 meq via ORAL
  Filled 2017-04-21: qty 1

## 2017-04-21 MED ORDER — ENOXAPARIN SODIUM 40 MG/0.4ML ~~LOC~~ SOLN
40.0000 mg | SUBCUTANEOUS | Status: DC
  Administered 2017-04-21: 40 mg via SUBCUTANEOUS
  Filled 2017-04-21: qty 0.4

## 2017-04-21 MED ORDER — METOPROLOL TARTRATE 50 MG PO TABS: 50.0000 mg | ORAL_TABLET | Freq: Two times a day (BID) | ORAL | Status: DC

## 2017-04-21 MED ORDER — LORATADINE 10 MG PO TABS
10.0000 mg | ORAL_TABLET | Freq: Every day | ORAL | Status: DC
  Administered 2017-04-21: 10 mg via ORAL
  Filled 2017-04-21: qty 1

## 2017-04-21 MED ORDER — ADENOSINE 6 MG/2ML IV SOLN
INTRAVENOUS | Status: AC
  Filled 2017-04-21: qty 2

## 2017-04-21 MED ORDER — AMLODIPINE BESYLATE 5 MG PO TABS: 2.5000 mg | ORAL_TABLET | Freq: Every day | ORAL | Status: DC

## 2017-04-21 MED ORDER — METOPROLOL TARTRATE 25 MG PO TABS: 25.0000 mg | ORAL_TABLET | Freq: Two times a day (BID) | ORAL | Status: DC

## 2017-04-21 MED ORDER — LORAZEPAM 0.5 MG PO TABS: 0.5000 mg | ORAL_TABLET | Freq: Every day | ORAL | 0 refills | Status: DC | PRN

## 2017-04-21 MED ORDER — DILTIAZEM HCL 30 MG PO TABS
30.0000 mg | ORAL_TABLET | Freq: Four times a day (QID) | ORAL | Status: DC | PRN
  Administered 2017-04-21: 30 mg via ORAL
  Filled 2017-04-21: qty 1

## 2017-04-21 MED ORDER — NORETHIN ACE-ETH ESTRAD-FE 1-20 MG-MCG PO TABS
1.0000 | ORAL_TABLET | Freq: Every day | ORAL | Status: DC
  Administered 2017-04-21: 1 via ORAL
  Filled 2017-04-21: qty 1

## 2017-04-21 MED ORDER — ACETAMINOPHEN 325 MG PO TABS: 650.0000 mg | ORAL_TABLET | Freq: Four times a day (QID) | ORAL | Status: DC | PRN

## 2017-04-21 MED ORDER — POTASSIUM CHLORIDE CRYS ER 20 MEQ PO TBCR
40.0000 meq | EXTENDED_RELEASE_TABLET | ORAL | Status: AC
  Administered 2017-04-21: 40 meq via ORAL
  Filled 2017-04-21: qty 2

## 2017-04-21 MED ORDER — METOPROLOL TARTRATE 25 MG PO TABS: 25.0000 mg | ORAL_TABLET | Freq: Two times a day (BID) | ORAL | 0 refills | Status: DC

## 2017-04-21 MED ORDER — LORAZEPAM 1 MG PO TABS: 1.0000 mg | ORAL_TABLET | Freq: Four times a day (QID) | ORAL | Status: DC | PRN

## 2017-04-21 MED ORDER — ONDANSETRON HCL 4 MG PO TABS: 4.0000 mg | ORAL_TABLET | Freq: Four times a day (QID) | ORAL | Status: DC | PRN

## 2017-04-21 MED ORDER — SODIUM CHLORIDE 0.9 % IV SOLN
INTRAVENOUS | Status: DC
  Administered 2017-04-21: 04:00:00 via INTRAVENOUS

## 2017-04-21 MED ORDER — MAGNESIUM OXIDE 400 (241.3 MG) MG PO TABS
400.0000 mg | ORAL_TABLET | Freq: Every day | ORAL | Status: DC
  Administered 2017-04-21: 400 mg via ORAL
  Filled 2017-04-21: qty 1

## 2017-04-21 NOTE — H&P (Addendum)
Betty Rodriguez is an 46 y.o. female.   Chief Complaint: Allergic reaction HPI: The patient with past medical history of anxiety presents to the emergency department complaining of allergic reaction. She was prescribed Zoloft yesterday and has taken 2 doses when this afternoon she awoke from a nap feeling palpitations and a odd sensation that she has difficulty describing that sounds like a panic attack. She went to urgent care where she was given Solu-Medrol and Benadryl. She went home to take Pepcid but continued to have palpitations. She checked her pulse which was 150 BPM and decided to come to the emergency department for evaluation. She denies chest pain or shortness of breath. EKG in the emergency department showed sinus tachycardia than SVT. Her rhythm would alternate between sinus tach and SVT. Throughout her time in the emergency department the patient was hemodynamically stable. ED staff administered Ativan which helped some but did not break her arrhythmia which found to the emergency department physician to call the hospitalist service for admission.  Past Medical History:  Diagnosis Date  . Allergy   . Chicken pox   . Kidney stones     Past Surgical History:  Procedure Laterality Date  . BREAST BIOPSY Right 07/06/2016   us/ core  . TONSILLECTOMY  1983    Family History  Problem Relation Age of Onset  . Hyperlipidemia Father   . Hypertension Father   . Diabetes Maternal Grandmother   . Hypertension Maternal Grandmother   . Heart disease Maternal Grandmother   . Stroke Maternal Grandmother 22  . Diabetes Maternal Grandfather   . Hypertension Maternal Grandfather   . Heart disease Maternal Grandfather    Social History:  reports that she has never smoked. She has never used smokeless tobacco. She reports that she drinks alcohol. She reports that she does not use drugs.  Allergies:  Allergies  Allergen Reactions  . Sulfa Antibiotics Hives    Medications Prior to  Admission  Medication Sig Dispense Refill  . amLODipine (NORVASC) 2.5 MG tablet Take 1 tablet (2.5 mg total) by mouth daily. 90 tablet 3  . fluticasone (FLONASE) 50 MCG/ACT nasal spray Place 1 spray into both nostrils daily.    [name] FE 1/20 1-20 MG-MCG tablet Take 1 tablet by mouth daily.    Marland Kitchen loratadine (CLARITIN) 10 MG tablet Take 10 mg by mouth daily.    . sertraline (ZOLOFT) 50 MG tablet Take 1 tablet (50 mg total) by mouth at bedtime. 90 tablet 3  . valACYclovir (VALTREX) 1000 MG tablet Take 1 tablet (1,000 mg total) by mouth 2 (two) times daily. for 3 days as needed 20 tablet 2    Results for orders placed or performed during the hospital encounter of 04/20/17 (from the past 48 hour(s))  CBC with Differential     Status: None   Collection Time: 04/20/17  9:46 PM  Result Value Ref Range   WBC 7.8 3.6 - 11.0 K/uL   RBC 4.37 3.80 - 5.20 MIL/uL   Hemoglobin 13.4 12.0 - 16.0 g/dL   HCT 38.4 35.0 - 47.0 %   MCV 88.1 80.0 - 100.0 fL   MCH 30.7 26.0 - 34.0 pg   MCHC 34.8 32.0 - 36.0 g/dL   RDW 12.9 11.5 - 14.5 %   Platelets 258 150 - 440 K/uL   Neutrophils Relative % 81 %   Neutro Abs 6.3 1.4 - 6.5 K/uL   Lymphocytes Relative 17 %   Lymphs Abs 1.3 1.0 - 3.6  K/uL   Monocytes Relative 2 %   Monocytes Absolute 0.2 0.2 - 0.9 K/uL   Eosinophils Relative 0 %   Eosinophils Absolute 0.0 0 - 0.7 K/uL   Basophils Relative 0 %   Basophils Absolute 0.0 0 - 0.1 K/uL  Basic metabolic panel     Status: Abnormal   Collection Time: 04/20/17  9:46 PM  Result Value Ref Range   Sodium 134 (L) 135 - 145 mmol/L   Potassium 3.3 (L) 3.5 - 5.1 mmol/L   Chloride 102 101 - 111 mmol/L   CO2 22 22 - 32 mmol/L   Glucose, Bld 223 (H) 65 - 99 mg/dL   BUN 15 6 - 20 mg/dL   Creatinine, Ser 0.81 0.44 - 1.00 mg/dL   Calcium 8.9 8.9 - 10.3 mg/dL   GFR calc non Af Amer >60 >60 mL/min   GFR calc Af Amer >60 >60 mL/min    Comment: (NOTE) The eGFR has been calculated using the CKD EPI equation. This  calculation has not been validated in all clinical situations. eGFR's persistently <60 mL/min signify possible Chronic Kidney Disease.    Anion gap 10 5 - 15  Troponin I     Status: None   Collection Time: 04/21/17 12:02 AM  Result Value Ref Range   Troponin I <0.03 <0.03 ng/mL  Urinalysis, Routine w reflex microscopic     Status: Abnormal   Collection Time: 04/21/17 12:02 AM  Result Value Ref Range   Color, Urine STRAW (A) YELLOW   APPearance CLEAR (A) CLEAR   Specific Gravity, Urine 1.008 1.005 - 1.030   pH 7.0 5.0 - 8.0   Glucose, UA 150 (A) NEGATIVE mg/dL   Hgb urine dipstick MODERATE (A) NEGATIVE   Bilirubin Urine NEGATIVE NEGATIVE   Ketones, ur NEGATIVE NEGATIVE mg/dL   Protein, ur NEGATIVE NEGATIVE mg/dL   Nitrite NEGATIVE NEGATIVE   Leukocytes, UA NEGATIVE NEGATIVE   RBC / HPF 0-5 0 - 5 RBC/hpf   WBC, UA 0-5 0 - 5 WBC/hpf   Bacteria, UA NONE SEEN NONE SEEN   Squamous Epithelial / LPF 0-5 (A) NONE SEEN  Pregnancy, urine POC     Status: None   Collection Time: 04/21/17  2:34 AM  Result Value Ref Range   Preg Test, Ur NEGATIVE NEGATIVE    Comment:        THE SENSITIVITY OF THIS METHODOLOGY IS >24 mIU/mL    No results found.  Review of Systems  Constitutional: Negative for chills and fever.  HENT: Negative for sore throat and tinnitus.   Eyes: Negative for blurred vision and redness.  Respiratory: Negative for cough and shortness of breath.   Cardiovascular: Positive for palpitations. Negative for chest pain, orthopnea and PND.  Gastrointestinal: Negative for abdominal pain, diarrhea, nausea and vomiting.  Genitourinary: Negative for dysuria, frequency and urgency.  Musculoskeletal: Negative for joint pain and myalgias.  Skin: Negative for rash.       No lesions  Neurological: Negative for speech change, focal weakness and weakness.  Endo/Heme/Allergies: Does not bruise/bleed easily.       No temperature intolerance  Psychiatric/Behavioral: Negative for  depression and suicidal ideas. The patient is nervous/anxious.     Blood pressure (!) 156/89, pulse (!) 116, temperature 98.2 F (36.8 C), temperature source Oral, resp. rate 16, height 5' 3"  (1.6 m), weight 51.8 kg (114 lb 1.6 oz), last menstrual period 03/30/2017, SpO2 98 %. Physical Exam  Vitals reviewed. Constitutional: She is oriented to person,  place, and time. She appears well-developed and well-nourished. No distress.  HENT:  Head: Normocephalic and atraumatic.  Mouth/Throat: Oropharynx is clear and moist.  Eyes: Conjunctivae and EOM are normal. Pupils are equal, round, and reactive to light. No scleral icterus.  Neck: Normal range of motion. Neck supple. No JVD present. No tracheal deviation present. No thyromegaly present.  Cardiovascular: Normal rate, regular rhythm and normal heart sounds.  Exam reveals no gallop and no friction rub.   No murmur heard. Respiratory: Effort normal and breath sounds normal.  GI: Soft. Bowel sounds are normal. She exhibits no distension. There is no tenderness.  Genitourinary:  Genitourinary Comments: Deferred  Musculoskeletal: Normal range of motion. She exhibits no edema.  Lymphadenopathy:    She has no cervical adenopathy.  Neurological: She is alert and oriented to person, place, and time. No cranial nerve deficit. She exhibits normal muscle tone.  Skin: Skin is warm and dry. No rash noted. No erythema.  Psychiatric: She has a normal mood and affect. Her behavior is normal. Judgment and thought content normal.     Assessment/Plan This is a 46 year old female admitted for persistent tachycardia. 1. Tachycardia: SVT with alternating sinus tach. The patient is hemodynamically stable. I have reviewed carotid massage Valsalva maneuver with her. Both slow her heart rate but do not break the SVT. She was recently prescribed amlodipine for elevated blood pressure. I have elected to discontinue this medication and start metoprolol. This may help  reduce her heart rate. Search for underlying cause of SVT. Check TSH. The patient may have hyperthyroidism. Consult cardiology.  2. Anxiety: Discontinue Zoloft for now although I doubt the patient is specifically having an allergic reaction to this medication. Ativan as needed for panic symptoms. 3. DVT prophylaxis: Lovenox 4. GI prophylaxis: None The patient is a full code. Time spent on admission orders and patient care proximally 45 minutes  Harrie Foreman, MD 04/21/2017, 6:06 AM

## 2017-04-21 NOTE — Discharge Instructions (Signed)
Resume diet and activity as before  Stop taking Amlodipine and Zoloft.

## 2017-04-21 NOTE — Telephone Encounter (Signed)
TCM call  6/14 1:30  Christell Faith, PA  Pt currently admitted

## 2017-04-21 NOTE — Telephone Encounter (Signed)
Attempted to reach patient for TCM call, left a VM.

## 2017-04-21 NOTE — Progress Notes (Signed)
[place], [place].   04/21/2017  Patient: Betty Rodriguez   Date of Birth:  15-Apr-1971  Date of admission:  04/20/2017  Date of Discharge  04/21/2017    To Whom it May Concern:   Betty Rodriguez  may return to work on 04/22/2017.  If you have any questions or concerns, please don't hesitate to call.  Sincerely,   Hillary Bow R M.D Office : [phone]   .

## 2017-04-21 NOTE — Plan of Care (Signed)
Problem: Skin Integrity: Goal: Risk for impaired skin integrity will decrease Outcome: Progressing Rash present on admission, pt states it was hives, but now its just a pinkish-red rash that is on pt's chest & back will continue to monitor.  Problem: Tissue Perfusion: Goal: Risk factors for ineffective tissue perfusion will decrease Outcome: Completed/Met Date Met: 04/21/17 lovenox for VTE  Problem: Activity: Goal: Risk for activity intolerance will decrease Outcome: Completed/Met Date Met: 04/21/17 Pt up ad lib independently, fiance is in room

## 2017-04-21 NOTE — Consult Note (Signed)
Cardiology Consultation Note  Patient ID: Betty Rodriguez, MRN: [id number], DOB/AGE: Oct 05, 1971 46 y.o. Admit date: 04/20/2017   Date of Consult: 04/21/2017 Primary Physician: Burnard Hawthorne, FNP Primary Cardiologist: New to [facility] - consult by Rockey Situ Requesting Physician: Dr. Marcille Blanco, MD  Chief Complaint: Palpitations/allergic reaction Reason for Consult: SVT  HPI: Betty Rodriguez is a 46 y.o. female who is being seen today for the evaluation of SVT at the request of Dr. Marcille Blanco, MD. Patient has a h/o anxiety and recently diagnosed HTN who presented to [facility] with allergic reaction and palpitations. She was found to have pSVT.   No prior cardiac history and has never seen a cardiologist before. Patient saw PCP on 5/21 for elevatd BP readings as well as anxiety. She was placed on amlodipine and Zoloft (previously on Buspar years ago without issues). SHe took her first dose of each without issues on Monday and took her 2nd doses on Tuesday, 5/22. This was followed by the development of urticaria, pruritis, and palpitations with a self documented heart rate in the 150s bpm. She presented to urgent care were she reports having a heart rate in the 150s and was given steroids, Benadryl, and advised to go home and take Pepcid. Upon arrival at home she continued to note palpitations and had a heart rate in the 150s bpm. Never with chest pain, SOB, dizziness, nausea, vomiting, presyncope, or syncope. She denies any illegal drugs of abuse or tobacco abuse. She rarely drinks ETOH. No recent GI illnesses or increased pain. Never on a diuretic.   Upon the patient's arrival to [facility] they were found to have BP 145/91, HR 105 bpm, temp 98.3, oxygen saturation 99% on room air, weight 113 pounds. EKGs as below, CXR not done. Labs showed K+ 3.3, normal TSH, unremarkable CBC, SCr 0.85, magnesium 1.7, HCG negative, urine drug screen pending. Telemetry showed sinus tachycardia with pSVT into the 180s bpm at  approximately 1:55 AM. She was chagned from amlodipine to metoprolol upon admission. Currently in sinus rhythm in the 80s to 90s bpm and asymptomatic.   Past Medical History:  Diagnosis Date  . Allergy   . Anxiety   . Chicken pox   . Kidney stones   . SVT (supraventricular tachycardia) ([place])       Most Recent Cardiac Studies: none   Surgical History:  Past Surgical History:  Procedure Laterality Date  . BREAST BIOPSY Right 07/06/2016   us/ core  . TONSILLECTOMY  1983     Home Meds: Prior to Admission medications   Medication Sig Start Date End Date Taking? Authorizing Provider  amLODipine (NORVASC) 2.5 MG tablet Take 1 tablet (2.5 mg total) by mouth daily. 04/19/17  Yes Arnett, Yvetta Coder, FNP  fluticasone (FLONASE) 50 MCG/ACT nasal spray Place 1 spray into both nostrils daily.   Yes [provider]  JUNEL FE 1/20 1-20 MG-MCG tablet Take 1 tablet by mouth daily. 10/29/15  Yes [provider]  loratadine (CLARITIN) 10 MG tablet Take 10 mg by mouth daily.   Yes [provider]  sertraline (ZOLOFT) 50 MG tablet Take 1 tablet (50 mg total) by mouth at bedtime. 04/19/17  Yes Burnard Hawthorne, FNP  valACYclovir (VALTREX) 1000 MG tablet Take 1 tablet (1,000 mg total) by mouth 2 (two) times daily. for 3 days as needed 04/19/17  Yes Burnard Hawthorne, FNP    Inpatient Medications:  . adenosine      . docusate sodium  100 mg Oral BID  .  enoxaparin (LOVENOX) injection  40 mg Subcutaneous Q24H  . fluticasone  1 spray Each Nare Daily  . loratadine  10 mg Oral Daily  . magnesium oxide  400 mg Oral Daily  . metoprolol tartrate  25 mg Oral BID  . norethindrone-ethinyl estradiol  1 tablet Oral Daily   . sodium chloride 125 mL/hr at 04/21/17 0429    Allergies:  Allergies  Allergen Reactions  . Sulfa Antibiotics Hives    Social History   Social History  . Marital status: Divorced    Spouse name: N/A  . Number of children: N/A  . Years of education:  N/A   Occupational History  . Not on file.   Social History Main Topics  . Smoking status: Never Smoker  . Smokeless tobacco: Never Used  . Alcohol use 0.0 oz/week     Comment: Rare   . Drug use: No  . Sexual activity: Yes    Partners: Male    Birth control/ protection: OCP     Comment: 1 partner    Other Topics Concern  . 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 Hyperlipidemia Father   . Hypertension Father   . Diabetes Maternal Grandmother   . Hypertension Maternal Grandmother   . Heart disease Maternal Grandmother   . Stroke Maternal Grandmother 32  . Diabetes Maternal Grandfather   . Hypertension Maternal Grandfather   . Heart disease Maternal Grandfather      Review of Systems: Review of Systems  Constitutional: Positive for malaise/fatigue. Negative for chills, diaphoresis, fever and weight loss.  HENT: Negative for congestion.   Eyes: Negative for discharge and redness.  Respiratory: Negative for cough, hemoptysis, sputum production, shortness of breath and wheezing.   Cardiovascular: Positive for palpitations. Negative for chest pain, orthopnea, claudication, leg swelling and PND.  Gastrointestinal: Negative for abdominal pain, blood in stool, heartburn, melena, nausea and vomiting.  Genitourinary: Negative for hematuria.  Musculoskeletal: Negative for falls and myalgias.  Skin: Positive for itching and rash.  Neurological: Negative for dizziness, tingling, tremors, sensory change, speech change, focal weakness, loss of consciousness and weakness.  Endo/Heme/Allergies: Does not bruise/bleed easily.  Psychiatric/Behavioral: Negative for substance abuse. The patient is nervous/anxious.   All other systems  reviewed and are negative.   Labs:  Recent Labs  04/21/17 0002  TROPONINI <0.03   Lab Results  Component Value Date   WBC 7.8 04/20/2017   HGB 13.4 04/20/2017   HCT 38.4 04/20/2017   MCV 88.1 04/20/2017   PLT 258 04/20/2017     Recent Labs Lab 04/20/17 2146  NA 134*  K 3.3*  CL 102  CO2 22  BUN 15  CREATININE 0.81  CALCIUM 8.9  GLUCOSE 223*   No results found for: CHOL, HDL, LDLCALC, TRIG No results found for: DDIMER  Radiology/Studies:  No results found.  EKG: Interpreted by me showed: 21:34 - sinus tachycardia, 119 bpm, no acute st/t changes. EKG 01:56 - SVT, 180 bpm, nonspecific st/t changes Telemetry: Interpreted by me showed: currently in sinus rhythm 80s to 90s bpm, initially with sinus tachycardia in the low 100s bpm converting to episode of  SVT into the 180s bpm  Weights: [facility] Weights   04/20/17 2135 04/21/17 0424  Weight: 113 lb (51.3 kg) 114 lb 1.6 oz (51.8 kg)     Physical Exam: Blood pressure (!) 147/89, pulse 83, temperature 98.4 F (36.9 C), temperature source Oral, resp. rate 18, height 5' 3"  (1.6 m), weight 114 lb 1.6 oz (51.8 kg), last menstrual period 03/30/2017, SpO2 98 %. Body mass index is 20.21 kg/m. General: Well developed, well nourished, in no acute distress. Head: Normocephalic, atraumatic, sclera non-icteric, no xanthomas, nares are without discharge.  Neck: Negative for carotid bruits. JVD not elevated. Lungs: Clear bilaterally to auscultation without wheezes, rales, or rhonchi. Breathing is unlabored. Heart: RRR with S1 S2. No murmurs, rubs, or gallops appreciated. Abdomen: Soft, non-tender, non-distended with normoactive bowel sounds. No hepatomegaly. No rebound/guarding. No obvious abdominal masses. Msk:  Strength and tone appear normal for age. Extremities: No clubbing or cyanosis. No edema. Distal pedal pulses are 2+ and equal bilaterally. Neuro: Alert and oriented X 3. No facial asymmetry. No focal deficit. Moves all  extremities spontaneously. Psych:  Responds to questions appropriately with a normal affect.    Assessment and Plan:  Principal Problem:   SVT (supraventricular tachycardia) (HCC) Active Problems:   Hypokalemia   Generalized anxiety disorder   Hypertension    1. pSVT: -Presented with sinus tachycardia into the low 100s, went into SVT into the 180s, broke wihtout intervention. Has been placed on metoprolol for rate control as well as for her HTN -Currently in sinus rhythm with heart rates in the 80s to 90s bpm -Likely in the setting of her hypokalemia of 3.3, has received 40 mEq x 1 overnight. Cardiology ordered another 30 mEq x 1 in an effort to reach K+ of 4.0 though cannot rule allergic reaction -Magnesium subclinically low at 1.7, will given magnesium oxide 400 mg daily -Urine drug screen pending, she denies tobacco and illegal drug abuse, rarely drinks ETOH -TSH normal -HGB normal -Continue metoprolol  -Check TTE to evaluate LVSF -Ambulate in the hallway to assess for adequate heart rate control -Consider prn short-acting diltiazem if she redevelops palpitations vs changing metoprolol to verapamil  -If this becomes recurrent could see EP for discussion of SVT ablation   2. Hypokalemia: -As above -If she demonstrates persistently low potassium levels could add spironolactone  -Will need outpatient follow up  3. HTN: -Amlodipine has been stopped -Continue metoprolol as above  4. Possible allergic reaction: -Per IM -Would avoid Zoloft  5. Anxiety: -Per IM -Was previously on Buspar without issues   Signed, Marcille Blanco [place] Pager: [phone] 04/21/2017, 9:39 AM

## 2017-04-21 NOTE — Care Management (Signed)
Placed I observation for possible allergic reaction and SVT. Cardiology consulting. Has not required IV meds to control heart rate but has required IV Ativan for anxiety. Independent in all adls, denies issues accessing medical care, obtaining medications or with transportation.  Current with her PCP.

## 2017-04-21 NOTE — Telephone Encounter (Signed)
Patient will discharge from [facility] on 04/21/17. Patient has been scheduled for 04/27/17.

## 2017-04-22 LAB — HEMOGLOBIN A1C
Hgb A1c MFr Bld: 5.3 % (ref 4.8–5.6)
Mean Plasma Glucose: 105 mg/dL

## 2017-04-22 LAB — HIV ANTIBODY (ROUTINE TESTING W REFLEX): HIV Screen 4th Generation wRfx: NONREACTIVE

## 2017-04-22 NOTE — Discharge Summary (Signed)
[place] [facility] [place] NAME: Betty Rodriguez    MR#:  [id number]  DATE OF BIRTH:  09-22-1971  DATE OF ADMISSION:  04/20/2017 ADMITTING PHYSICIAN: Harrie Foreman, MD  DATE OF DISCHARGE: 04/21/2017  1:51 PM  PRIMARY CARE PHYSICIAN: Burnard Hawthorne, FNP   ADMISSION DIAGNOSIS:  Tachycardia [R00.0] Anaphylaxis, initial encounter [T78.2XXA]  DISCHARGE DIAGNOSIS:  Principal Problem:   SVT (supraventricular tachycardia) (HCC) Active Problems:   Generalized anxiety disorder   Hypertension   Hypokalemia   SECONDARY DIAGNOSIS:   Past Medical History:  Diagnosis Date  . Allergy   . Anxiety   . Chicken pox   . Kidney stones   . SVT (supraventricular tachycardia) ([place])      ADMITTING HISTORY  Chief Complaint: Allergic reaction HPI: The patient with past medical history of anxiety presents to the emergency department complaining of allergic reaction. She was prescribed Zoloft yesterday and has taken 2 doses when this afternoon she awoke from a nap feeling palpitations and a odd sensation that she has difficulty describing that sounds like a panic attack. She went to urgent care where she was given Solu-Medrol and Benadryl. She went home to take Pepcid but continued to have palpitations. She checked her pulse which was 150 BPM and decided to come to the emergency department for evaluation. She denies chest pain or shortness of breath. EKG in the emergency department showed sinus tachycardia than SVT. Her rhythm would alternate between sinus tach and SVT. Throughout her time in the emergency department the patient was hemodynamically stable. ED staff administered Ativan which helped some but did not break her arrhythmia which found to the emergency department physician to call the hospitalist service for admission.  HOSPITAL COURSE:   * SVT Admitted to telemetry. Started on lopressor 25 mg BID. This was increased to 50 mg BID at discharge. TSH  normal. Seen by cardiology Dr. Rockey Situ. She was also prescribed Cardizem as needed for heart rate greater than 130. No signs of infection found. By time of discharge patient's heart rate is between 90-105. Patient will have an echocardiogram done at the cardiology office during her follow-up visit.  * Rash likely due to Zoloft has resolved. No signs of allergy at time of discharge.  Stable for discharge home.  CONSULTS OBTAINED:  Treatment Team:  Minna Merritts, MD  DRUG ALLERGIES:   Allergies  Allergen Reactions  . Sulfa Antibiotics Hives    DISCHARGE MEDICATIONS:   Discharge Medication List as of 04/21/2017  1:07 PM    START taking these medications   Details  diltiazem (CARDIZEM) 30 MG tablet Take 1 tablet (30 mg total) by mouth every 6 (six) hours as needed (palpitations)., Starting Wed 04/21/2017, Normal    LORazepam (ATIVAN) 0.5 MG tablet Take 1 tablet (0.5 mg total) by mouth daily as needed for anxiety., Starting Wed 04/21/2017, Print      CONTINUE these medications which have CHANGED   Details  metoprolol tartrate (LOPRESSOR) 50 MG tablet Take 1 tablet (50 mg total) by mouth 2 (two) times daily., Starting Wed 04/21/2017, Normal      CONTINUE these medications which have NOT CHANGED   Details  fluticasone (FLONASE) 50 MCG/ACT nasal spray Place 1 spray into both nostrils daily., Historical Med    JUNEL FE 1/20 1-20 MG-MCG tablet Take 1 tablet by mouth daily., Starting Tue 10/29/2015, Historical Med    loratadine (CLARITIN) 10 MG tablet Take 10 mg by mouth daily., Historical Med  valACYclovir (VALTREX) 1000 MG tablet Take 1 tablet (1,000 mg total) by mouth 2 (two) times daily. for 3 days as needed, Starting Mon 04/19/2017, Normal      STOP taking these medications     amLODipine (NORVASC) 2.5 MG tablet      sertraline (ZOLOFT) 50 MG tablet         Today   VITAL SIGNS:  Blood pressure (!) 147/89, pulse 83, temperature 98.4 F (36.9 C), temperature source  Oral, resp. rate 18, height 5' 3"  (1.6 m), weight 51.8 kg (114 lb 1.6 oz), last menstrual period 03/30/2017, SpO2 98 %.  I/O:  No intake or output data in the 24 hours ending 04/22/17 1409  PHYSICAL EXAMINATION:  Physical Exam  GENERAL:  46 y.o.-year-old patient lying in the bed with no acute distress.  LUNGS: Normal breath sounds bilaterally, no wheezing, rales,rhonchi or crepitation. No use of accessory muscles of respiration.  CARDIOVASCULAR: S1, S2 normal. No murmurs, rubs, or gallops.  ABDOMEN: Soft, non-tender, non-distended. Bowel sounds present. No organomegaly or mass.  NEUROLOGIC: Moves all 4 extremities. PSYCHIATRIC: The patient is alert and oriented x 3.  SKIN: No obvious rash, lesion, or ulcer.   DATA REVIEW:   CBC  Recent Labs Lab 04/20/17 2146  WBC 7.8  HGB 13.4  HCT 38.4  PLT 258    Chemistries   Recent Labs Lab 04/20/17 2146 04/21/17 0002  NA 134*  --   K 3.3*  --   CL 102  --   CO2 22  --   GLUCOSE 223*  --   BUN 15  --   CREATININE 0.81  --   CALCIUM 8.9  --   MG  --  1.7    Cardiac Enzymes  Recent Labs Lab 04/21/17 0002  TROPONINI <0.03    Microbiology Results  No results found for this or any previous visit.  RADIOLOGY:  No results found.  Follow up with PCP in 1 week.  Management plans discussed with the patient, family and they are in agreement.  CODE STATUS:  Code Status History    Date Active Date Inactive Code Status Order ID Comments User Context   04/21/2017  4:16 AM 04/21/2017  4:56 PM Full Code [id number]  Harrie Foreman, MD Inpatient      TOTAL TIME TAKING CARE OF THIS PATIENT ON DAY OF DISCHARGE: more than 30 minutes.   Hillary Bow R M.D on 04/22/2017 at 2:09 PM  Between 7am to 6pm - Pager - [phone]  After 6pm go to [email or web address] - password [email or web address] [place] [facility]  Office  [phone]  CC: Primary care physician; Burnard Hawthorne, FNP  Note: This dictation was prepared with  Dragon dictation along with smaller phrase technology. Any transcriptional errors that result from this process are unintentional.

## 2017-04-22 NOTE — Telephone Encounter (Signed)
Patient contacted regarding discharge from [facility] on 04/21/17.   Patient understands to follow up with provider ? On (date) ?at ?(time) at ? (which office).  Patient understands discharge instructions? Yes  Patient understands medications and regiment? Yes Patient understands to bring all medications to this visit? Yes   Patient said that she was told yesterday by the cardiologist to contact our office to schedule an echocardiogram. Advised patient I will route to Christell Faith, PA to advise on ok to go ahead and schedule echo prior to appt and will let her know his advice. She verbalized understanding.

## 2017-04-22 NOTE — Telephone Encounter (Signed)
Spoke with patient and let her know we can go ahead and schedule the echo appt and it is ok if it is sometime after the  F/u appt with Thurmond Butts. She verbalized understanding but would like to have it as soon as possible.  Advised we can most certainly put her on the waiting list. She was agreeable to this.

## 2017-04-22 NOTE — Telephone Encounter (Signed)
Pt calling stating she was told to call us and she thinks she may need an echo done here in the office She was seen in hospital and is coming on 05/13/17 to see Christell Faith as a TCM  Please advise.

## 2017-04-22 NOTE — Telephone Encounter (Signed)
Ok to order echo. It is ok if this is after her appointment as that will allow for any possible tachy-mediated cardiomyopathy to improve should she remain in sinus rhythm.

## 2017-04-23 ENCOUNTER — Telehealth: Payer: Self-pay | Admitting: Physician Assistant

## 2017-04-23 NOTE — Telephone Encounter (Signed)
Pt calling stating while being in hospital  She was given medications  But was not sure how low her BP needs to be before taking them Please advise.

## 2017-04-23 NOTE — Telephone Encounter (Signed)
Patient calling to see how low her blood pressure can go before she should be concerned. She reports the lowest was 98/62 and then a few hours later it was 102/66. Reviewed medications with her and she is taking the metoprolol 50 mg twice a day. Instructed her to continue monitoring her blood pressures and keep a log of those readings to bring to her upcoming appointments. Also advised her to please call if blood pressures drop any lower with other symptoms. Confirmed upcoming appointments with PCP and cardiology. She verbalized understanding of our conversation, agreement with plan, and had no further questions at this time.

## 2017-04-23 NOTE — Telephone Encounter (Signed)
Transition Care Management Follow-up Telephone Call  How have you been since you were released from the hospital? "I have felt fine"   Do you understand why you were in the hospital? Yes, " My heart was beating really fast and they thought was a reaction to medication Zoloft and Amlodipine.   Do you understand the discharge instrcutions? yes  Items Reviewed:  Medications reviewed:Yes  Allergies reviewed: Yes  Dietary changes reviewed: yes  Referrals reviewed: Yes   Functional Questionnaire:   Activities of Daily Living (ADLs):   She states they are independent in the following: All ADLs States they require assistance with the following: No assist needed at this time.   Any transportation issues/concerns?: No    Any patient concerns? No concerns at this time.   Confirmed importance and date/time of follow-up visits scheduled: yes   Confirmed with patient if condition begins to worsen call PCP or go to the ER.  Patient was given the Call-a-Nurse line [phone]: Yes

## 2017-04-27 ENCOUNTER — Ambulatory Visit (INDEPENDENT_AMBULATORY_CARE_PROVIDER_SITE_OTHER): Payer: 59 | Admitting: Family

## 2017-04-27 ENCOUNTER — Encounter: Payer: Self-pay | Admitting: Family

## 2017-04-27 VITALS — BP 134/76 | HR 81 | Temp 98.0°F | Ht 63.0 in | Wt 110.6 lb

## 2017-04-27 DIAGNOSIS — I10 Essential (primary) hypertension: Secondary | ICD-10-CM

## 2017-04-27 DIAGNOSIS — F411 Generalized anxiety disorder: Secondary | ICD-10-CM | POA: Diagnosis not present

## 2017-04-27 DIAGNOSIS — I471 Supraventricular tachycardia: Secondary | ICD-10-CM | POA: Diagnosis not present

## 2017-04-27 MED ORDER — BUSPIRONE HCL 10 MG PO TABS: 10.0000 mg | ORAL_TABLET | Freq: Two times a day (BID) | ORAL | 2 refills | Status: DC

## 2017-04-27 NOTE — Assessment & Plan Note (Addendum)
EKG today shows sinus rhythm. When compared to prior EKG 04/20/17 which showed sinus tachycardia with heart rate of 119. No earlier EKGs to compare too. EKG today showed shortening of PR interval. No delta waves seen. Patient is seeing Dr. Rockey Situ in 2 weeks. Advised close vigilance in interim.

## 2017-04-27 NOTE — Patient Instructions (Signed)
Pleasure seeing you  buspar as needed   Keep appt with Gollan and let me  Know if you need anything in between  Follow up in 2 months

## 2017-04-27 NOTE — Progress Notes (Signed)
Subjective:    Patient ID: Betty Rodriguez, female    DOB: 1971-04-22, 46 y.o.   MRN: [id number]  CC: Betty Rodriguez is a 46 y.o. female who presents today for follow up.   HPI: 5/22 ED for heart palpitations to 180, hives. TCM call 5/25 Patient was discharged 5/23 after one day admission Accompanied by partner today. Had hives at that time. No more palpitations, hives.   Potassium was low. Magnesium was normal. TSH normal.   On cardizem prn igf rate > 130 and metoprolol daily. Following with cardiology, Dr Rockey Situ. Now cutting metoprolol 59m bid as 3 nights ago as BP 90/60.Monitoring HR at home , and hasn't gone 'greater than 100.' Advised to hold metoprolol.  No longer having palpiations. Follow up is 2 weeks and planning echo.   Off of norvasc and zoloft. BP averages 109/61    HISTORY:  Past Medical History:  Diagnosis Date  . Allergy   . Anxiety   . Chicken pox   . Kidney stones   . SVT (supraventricular tachycardia) (HCC)    Past Surgical History:  Procedure Laterality Date  . BREAST BIOPSY Right 07/06/2016   us/ core  . TONSILLECTOMY  1983   Family History  Problem Relation Age of Onset  . Hyperlipidemia Father   . Hypertension Father   . Diabetes Maternal Grandmother   . Hypertension Maternal Grandmother   . Heart disease Maternal Grandmother   . Stroke Maternal Grandmother 762 . Diabetes Maternal Grandfather   . Hypertension Maternal Grandfather   . Heart disease Maternal Grandfather     Allergies: Norvasc [amlodipine besylate]; Sulfa antibiotics; and Zoloft [sertraline] Current Outpatient Prescriptions on File Prior to Visit  Medication Sig Dispense Refill  . diltiazem (CARDIZEM) 30 MG tablet Take 1 tablet (30 mg total) by mouth every 6 (six) hours as needed (palpitations). 20 tablet 0  . fluticasone (FLONASE) 50 MCG/ACT nasal spray Place 1 spray into both nostrils daily.    .Lenda KelpFE 1/20 1-20 MG-MCG tablet Take 1 tablet by mouth daily.    .Marland Kitchen loratadine (CLARITIN) 10 MG tablet Take 10 mg by mouth daily.    .Marland KitchenLORazepam (ATIVAN) 0.5 MG tablet Take 1 tablet (0.5 mg total) by mouth daily as needed for anxiety. 10 tablet 0  . metoprolol tartrate (LOPRESSOR) 50 MG tablet Take 1 tablet (50 mg total) by mouth 2 (two) times daily. 60 tablet 0  . valACYclovir (VALTREX) 1000 MG tablet Take 1 tablet (1,000 mg total) by mouth 2 (two) times daily. for 3 days as needed 20 tablet 2   No current facility-administered medications on file prior to visit.     Social History  Substance Use Topics  . Smoking status: Never Smoker  . Smokeless tobacco: Never Used  . Alcohol use 0.0 oz/week     Comment: Rare     Review of Systems  Constitutional: Negative for chills and fever.  Respiratory: Negative for cough.   Cardiovascular: Negative for chest pain and palpitations.  Gastrointestinal: Negative for nausea and vomiting.      Objective:    BP 134/76   Pulse 81   Temp 98 F (36.7 C) (Oral)   Ht 5' 3"  (1.6 m)   Wt 110 lb 9.6 oz (50.2 kg)   LMP 03/30/2017 (Exact Date)   SpO2 98%   BMI 19.59 kg/m  BP Readings from Last 3 Encounters:  04/27/17 134/76  04/21/17 (!) 147/89  04/19/17 (!) 158/84   Wt  Readings from Last 3 Encounters:  04/27/17 110 lb 9.6 oz (50.2 kg)  04/21/17 114 lb 1.6 oz (51.8 kg)  04/19/17 113 lb 2 oz (51.3 kg)    Physical Exam  Constitutional: She appears well-developed and well-nourished.  Eyes: Conjunctivae are normal.  Cardiovascular: Normal rate, regular rhythm, normal heart sounds and normal pulses.   Pulmonary/Chest: Effort normal and breath sounds normal. She has no wheezes. She has no rhonchi. She has no rales.  Neurological: She is alert.  Skin: Skin is warm and dry.  Psychiatric: She has a normal mood and affect. Her speech is normal and behavior is normal. Thought content normal.  Vitals reviewed.      Assessment & Plan:   Problem List Items Addressed This Visit      Cardiovascular and  Mediastinum   Hypertension   Relevant Orders   Comprehensive metabolic panel   Magnesium   SVT (supraventricular tachycardia) (HCC) - Primary    EKG today shows sinus rhythm. When compared to prior EKG 04/20/17 which showed sinus tachycardia with heart rate of 119. No earlier EKGs to compare too. EKG today showed shortening of PR interval. No delta waves seen. Patient is seeing Dr. Rockey Situ in 2 weeks. Advised close vigilance in interim.       Relevant Orders   EKG 12-Lead (Completed)     Other   Generalized anxiety disorder   Relevant Medications   busPIRone (BUSPAR) 10 MG tablet       I am having Ms. Woodhead start on busPIRone. I am also having her maintain her JUNEL FE 1/20, loratadine, fluticasone, valACYclovir, diltiazem, metoprolol tartrate, and LORazepam.   Meds ordered this encounter  Medications  . busPIRone (BUSPAR) 10 MG tablet    Sig: Take 1 tablet (10 mg total) by mouth 2 (two) times daily.    Dispense:  60 tablet    Refill:  2    Order Specific Question:   Supervising Provider    Answer:   Crecencio Mc [[id number]]    Return precautions given.   Risks, benefits, and alternatives of the medications and treatment plan prescribed today were discussed, and patient expressed understanding.   Education regarding symptom management and diagnosis given to patient on AVS.  Continue to follow with Burnard Hawthorne, FNP for routine health maintenance.   [facility] and I agreed with plan.   Mable Paris, FNP

## 2017-04-27 NOTE — Progress Notes (Signed)
Pre visit review using our clinic review tool, if applicable. No additional management support is needed unless otherwise documented below in the visit note. 

## 2017-04-28 LAB — COMPREHENSIVE METABOLIC PANEL
ALT: 18 IU/L (ref 0–32)
AST: 16 IU/L (ref 0–40)
Albumin/Globulin Ratio: 1.6 (ref 1.2–2.2)
Albumin: 4.2 g/dL (ref 3.5–5.5)
Alkaline Phosphatase: 32 IU/L — ABNORMAL LOW (ref 39–117)
BUN/Creatinine Ratio: 14 (ref 9–23)
BUN: 12 mg/dL (ref 6–24)
Bilirubin Total: 0.4 mg/dL (ref 0.0–1.2)
CO2: 24 mmol/L (ref 18–29)
Calcium: 9.3 mg/dL (ref 8.7–10.2)
Chloride: 100 mmol/L (ref 96–106)
Creatinine, Ser: 0.86 mg/dL (ref 0.57–1.00)
GFR calc Af Amer: 94 mL/min/{1.73_m2} (ref >59)
GFR calc non Af Amer: 82 mL/min/{1.73_m2} (ref >59)
Globulin, Total: 2.6 g/dL (ref 1.5–4.5)
Glucose: 103 mg/dL — ABNORMAL HIGH (ref 65–99)
Potassium: 4.3 mmol/L (ref 3.5–5.2)
Sodium: 138 mmol/L (ref 134–144)
Total Protein: 6.8 g/dL (ref 6.0–8.5)

## 2017-04-28 LAB — MAGNESIUM: Magnesium: 2.2 mg/dL (ref 1.6–2.3)

## 2017-05-04 ENCOUNTER — Encounter: Payer: Self-pay | Admitting: Family

## 2017-05-04 ENCOUNTER — Other Ambulatory Visit: Payer: Self-pay | Admitting: Family

## 2017-05-04 DIAGNOSIS — I471 Supraventricular tachycardia: Secondary | ICD-10-CM

## 2017-05-09 ENCOUNTER — Encounter: Payer: Self-pay | Admitting: Physician Assistant

## 2017-05-12 ENCOUNTER — Ambulatory Visit (INDEPENDENT_AMBULATORY_CARE_PROVIDER_SITE_OTHER): Payer: 59

## 2017-05-12 ENCOUNTER — Other Ambulatory Visit: Payer: Self-pay

## 2017-05-12 ENCOUNTER — Telehealth: Payer: Self-pay | Admitting: Family

## 2017-05-12 DIAGNOSIS — R000 Tachycardia, unspecified: Secondary | ICD-10-CM | POA: Diagnosis not present

## 2017-05-12 DIAGNOSIS — R0602 Shortness of breath: Secondary | ICD-10-CM

## 2017-05-12 NOTE — Telephone Encounter (Signed)
Ivin Booty from Dr. Tyrell Antonio office called looking to get a copy of pt's ekg from 5/29 but we are unable to pull it up because of they way that it was saved. Do you have another copy that I can send over?  Fax @ [phone]

## 2017-05-13 ENCOUNTER — Encounter: Payer: Self-pay | Admitting: Physician Assistant

## 2017-05-13 ENCOUNTER — Ambulatory Visit (INDEPENDENT_AMBULATORY_CARE_PROVIDER_SITE_OTHER): Payer: 59 | Admitting: Physician Assistant

## 2017-05-13 VITALS — BP 130/72 | HR 78 | Ht 63.0 in | Wt 112.0 lb

## 2017-05-13 DIAGNOSIS — I10 Essential (primary) hypertension: Secondary | ICD-10-CM

## 2017-05-13 DIAGNOSIS — Z79899 Other long term (current) drug therapy: Secondary | ICD-10-CM | POA: Diagnosis not present

## 2017-05-13 DIAGNOSIS — I471 Supraventricular tachycardia: Secondary | ICD-10-CM | POA: Diagnosis not present

## 2017-05-13 MED ORDER — METOPROLOL TARTRATE 25 MG PO TABS: 25.0000 mg | ORAL_TABLET | Freq: Every day | ORAL | 3 refills | Status: DC

## 2017-05-13 NOTE — Progress Notes (Signed)
Cardiology Office Note Date:  05/13/2017  Patient ID:  Betty Rodriguez [birth year], MRN [id number] PCP:  Burnard Hawthorne, FNP  Cardiologist:  Dr. Rockey Situ, MD    Chief Complaint: Hospital follow up  History of Present Illness: Betty Rodriguez is a 46 y.o. female with history of recently diagnosed SVT in 03/2017, HTN, and anxiety who presents for hospital follow up after recent admission to [facility] from 5/23-5/24 for allergic reaction and palpations noted to have SVT.   Prior to the above admission she did not have any previously known cardiac history. Patient saw PCP on 5/21 for elevated BP readings as well as anxiety. She was placed on amlodipine and Zoloft (previously on Buspar years ago without issues). She took her first dose of each without issues on 5/21 and took her 2nd doses on Tuesday, 5/22. This was followed by the development of urticaria, pruritis, and palpitations with a self documented heart rate in the 150s bpm. She presented to urgent care were she reports having a heart rate in the 150s and was given steroids, Benadryl, and advised to go home and take Pepcid. Upon arrival at home she continued to note palpitations and had a heart rate in the 150s bpm. She presented to [facility] on 5/22 with the above and was noted to be tachycrdic with inital EKG documenting sinus tachycardia in the 110s bpm followed by development of SVT on subsequent EKG into the 180s bpm. She spontaneously converted to sinus rhythm without intervention. She was noted to be hypokalemic at 3.3 that was repleted to 4.3. TSH normal, urine drug screen negative, magnesium 1.7 that was repleted to 2.2, troponin negative, unremarkable CBC. She was started on metoprolol 25 mg bid given her BP and heart rate, which was titrated to 50 mg bid at time of discharge. Her amlodipine and Zoloft were held given the possible allergic reaction. She was given prn short-acting diltiazem to use for tachy-palpitations. Echocardiogram was  advised as an outpatient.  Patient comes in today doing well. She reports blood pressure has been overall stable during the days though has noted an occasional soft blood pressure approximately an hour after taking her metoprolol. This led to a decrease in her dose from 50 twice a day to 25 mg daily. She takes her metoprolol in the evening which is actually her morning as she works overnight shifts. The prior evening she took her metoprolol like she usually does which was followed by just some general uneasy feelings. She checked her vitals found her systolic pressure to be 90 with a heart rate of 104. She sat down, felt better, with recheck vitals showing improving heart rate. No further palpitations similar to her presenting symptoms as above. No chest pain. Echocardiogram performed on 6/13 showed normal LV systolic function with EF of 55-60%, normal wall motion, normal LV diastolic function parameters. She would like to get back to regular exercising. No further issues with rash/urticaria.  Unable to review EKG performed at PCP office on 5/29 as link will not open EKG for evaluation. We have contacted PCP office in an effort to obtain a copy of this twelve-lead, however we were told it appears the study was saved incorrectly and they were having difficulty opening the file for back to our office as well.   Past Medical History:  Diagnosis Date  . Allergy   . Anxiety   . Chicken pox   . Kidney stones   . SVT (supraventricular tachycardia) ([place])  a. diagnosed 03/2017 in setting of hypokalemia    Past Surgical History:  Procedure Laterality Date  . BREAST BIOPSY Right 07/06/2016   us/ core  . TONSILLECTOMY  1983    Current Outpatient Prescriptions  Medication Sig Dispense Refill  . busPIRone (BUSPAR) 10 MG tablet Take 1 tablet (10 mg total) by mouth 2 (two) times daily. 60 tablet 2  . diltiazem (CARDIZEM) 30 MG tablet Take 1 tablet (30 mg total) by mouth every 6 (six) hours as needed  (palpitations). 20 tablet 0  . fluticasone (FLONASE) 50 MCG/ACT nasal spray Place 1 spray into both nostrils daily.    Betty Rodriguez FE 1/20 1-20 MG-MCG tablet Take 1 tablet by mouth daily.    Marland Kitchen loratadine (CLARITIN) 10 MG tablet Take 10 mg by mouth daily.    Marland Kitchen LORazepam (ATIVAN) 0.5 MG tablet Take 1 tablet (0.5 mg total) by mouth daily as needed for anxiety. 10 tablet 0  . valACYclovir (VALTREX) 1000 MG tablet Take 1 tablet (1,000 mg total) by mouth 2 (two) times daily. for 3 days as needed 20 tablet 2  . metoprolol tartrate (LOPRESSOR) 25 MG tablet Take 1 tablet (25 mg total) by mouth at bedtime. 90 tablet 3   No current facility-administered medications for this visit.     Allergies:   Norvasc [amlodipine besylate]; Sulfa antibiotics; and Zoloft [sertraline]   Social History:  The patient  reports that she has never smoked. She has never used smokeless tobacco. She reports that she drinks alcohol. She reports that she does not use drugs.   Family History:  The patient's family history includes Diabetes in her maternal grandfather and maternal grandmother; Heart disease in her maternal grandfather and maternal grandmother; Hyperlipidemia in her father; Hypertension in her father, maternal grandfather, and maternal grandmother; Stroke (age of onset: 30) in her maternal grandmother.  ROS:   Review of Systems  Constitutional: Positive for malaise/fatigue. Negative for chills, diaphoresis, fever and weight loss.  HENT: Negative for congestion.   Eyes: Negative for discharge and redness.  Respiratory: Negative for cough, hemoptysis, sputum production, shortness of breath and wheezing.   Cardiovascular: Negative for chest pain, palpitations, orthopnea, claudication, leg swelling and PND.  Gastrointestinal: Negative for abdominal pain, blood in stool, heartburn, melena, nausea and vomiting.  Genitourinary: Negative for hematuria.  Musculoskeletal: Negative for falls and myalgias.  Skin: Negative for  rash.  Neurological: Positive for dizziness and weakness. Negative for tingling, tremors, sensory change, speech change, focal weakness and loss of consciousness.  Endo/Heme/Allergies: Does not bruise/bleed easily.  Psychiatric/Behavioral: Negative for depression, memory loss and substance abuse. The patient is not nervous/anxious.      PHYSICAL EXAM:  VS:  BP 130/72 (BP Location: Left Arm, Patient Position: Sitting, Cuff Size: Normal)   Pulse 78   Ht 5' 3"  (1.6 m)   Wt 112 lb (50.8 kg)   BMI 19.84 kg/m  BMI: Body mass index is 19.84 kg/m.  Physical Exam  Constitutional: She is oriented to person, place, and time. She appears well-developed and well-nourished.  HENT:  Head: Normocephalic and atraumatic.  Eyes: Right eye exhibits no discharge. Left eye exhibits no discharge.  Neck: Normal range of motion. No JVD present.  Cardiovascular: Normal rate, regular rhythm, S1 normal, S2 normal and normal heart sounds.  Exam reveals no distant heart sounds, no friction rub, no midsystolic click and no opening snap.   No murmur heard. Pulses:      Posterior tibial pulses are 2+ on the right  side, and 2+ on the left side.  Pulmonary/Chest: Effort normal and breath sounds normal. No respiratory distress. She has no decreased breath sounds. She has no wheezes. She has no rales. She exhibits no tenderness.  Abdominal: Soft. She exhibits no distension. There is no tenderness.  Musculoskeletal: She exhibits no edema.  Neurological: She is alert and oriented to person, place, and time.  Skin: Skin is warm and dry. No cyanosis. Nails show no clubbing.  Psychiatric: She has a normal mood and affect. Her speech is normal and behavior is normal. Judgment and thought content normal.     EKG:  Was ordered and interpreted by me today. Shows NSR, 78 bpm, short PR interval at 80 msec (appears stable when compared to prior), no delta wave, no acute st/t changes  Recent Labs: 04/20/2017: Hemoglobin 13.4;  Platelets 258 04/21/2017: TSH 0.553 04/27/2017: ALT 18; BUN 12; Creatinine, Ser 0.86; Magnesium 2.2; Potassium 4.3; Sodium 138  No results found for requested labs within last 8760 hours.   Estimated Creatinine Clearance: 66.2 mL/min (by C-G formula based on SCr of 0.86 mg/dL).   Wt Readings from Last 3 Encounters:  05/13/17 112 lb (50.8 kg)  04/27/17 110 lb 9.6 oz (50.2 kg)  04/21/17 114 lb 1.6 oz (51.8 kg)     Other studies reviewed: Additional studies/records reviewed today include: summarized above  ASSESSMENT AND PLAN:  1. Paroxysmal SVT: Remains in sinus rhythm today with heart rate of 78 BPM. Since her discharge she has had issues with soft blood pressure on metoprolol 50 mg twice a day leading to decrease in dose to 25 mg daily prior to today's visit. Overall, she has been tolerating this dose though has noted some lower blood pressures after taking her 25 mg of metoprolol in the evening (which is actually the start of her day and she works third shift). We will have her perform a trial of taking metoprolol 12.5 mg in the evening (at the start of her day) versus taking 25 mg prior to going to bed. She could also consider taking 12.5 mg prior to going to bed if she notes orthostasis. Check bmet and magnesium today. Continue when necessary diltiazem (she has not needed any). Should she have subsequent episodes of SVT would prefer to EP for SVT ablation.  2. Hypokalemia: Repleted as of labs drawn on 5/29. Check potassium level today.  3. HTN: Well controlled at today's visit with episodes of hypotension into the 77N systolic on metoprolol as above. Decreased metoprolol as above.  4. Anxiety: We'll controlled on BuSpar.   Disposition: F/u with Dr. Rockey Situ, MD in 3 months.   Current medicines are reviewed at length with the patient today.  The patient did not have any concerns regarding medicines.  Melvern Banker PA-C 05/13/2017 2:02 PM     [address] [place] [address] [place], [place] [address] [phone]

## 2017-05-13 NOTE — Patient Instructions (Signed)
Medication Instructions:  Your physician has recommended you make the following change in your medication:  1- You may take your Lopressor one of the following ways:  A. Lopressor 25 mg (1 tablet) by mouth every night at bedtime.  B. Lopressor 12.5 mg (0.5 tablet) by mouth every morning.  - Please call us back in 1 week with an update on your medication and blood pressures.   Labwork: Your physician recommends that you return for lab work in: [facility] at the [place] location of your choice. - Please have them fax labs to [phone].   Testing/Procedures: none  Follow-Up:  Your physician recommends that you schedule a follow-up appointment in: [place].   If you need a refill on your cardiac medications before your next appointment, please call your pharmacy.

## 2017-05-13 NOTE — Telephone Encounter (Signed)
Per Juliann Pulse, unable to print ekg as not able to log in to MUSE. Juliann Pulse informed dr Tyrell Antonio office

## 2017-05-14 ENCOUNTER — Encounter: Payer: Self-pay | Admitting: Physician Assistant

## 2017-05-19 ENCOUNTER — Ambulatory Visit: Payer: 59 | Admitting: Family

## 2017-05-28 ENCOUNTER — Other Ambulatory Visit: Payer: 59

## 2017-05-31 ENCOUNTER — Encounter: Payer: Self-pay | Admitting: Family

## 2017-05-31 MED ORDER — JUNEL FE 1/20 1-20 MG-MCG PO TABS: 1.0000 | ORAL_TABLET | Freq: Every day | ORAL | 11 refills | Status: DC

## 2017-06-03 ENCOUNTER — Telehealth: Payer: Self-pay | Admitting: Obstetrics & Gynecology

## 2017-06-03 NOTE — Telephone Encounter (Signed)
Dr. Leonides Schanz no longer with [facility] patient need to schedule appt. Prescription not authorized

## 2017-06-09 ENCOUNTER — Ambulatory Visit (INDEPENDENT_AMBULATORY_CARE_PROVIDER_SITE_OTHER): Payer: 59 | Admitting: Family

## 2017-06-09 ENCOUNTER — Encounter: Payer: Self-pay | Admitting: Family

## 2017-06-09 ENCOUNTER — Other Ambulatory Visit (HOSPITAL_COMMUNITY)
Admission: RE | Admit: 2017-06-09 | Discharge: 2017-06-09 | Disposition: A | Discharge status: Home / Self Care | Payer: 59 | Source: Ambulatory Visit | Attending: Family | Admitting: Family

## 2017-06-09 VITALS — BP 130/68 | HR 90 | Temp 98.5°F | Ht 63.0 in | Wt 115.2 lb

## 2017-06-09 DIAGNOSIS — Z0000 Encounter for general adult medical examination without abnormal findings: Secondary | ICD-10-CM | POA: Insufficient documentation

## 2017-06-09 DIAGNOSIS — I471 Supraventricular tachycardia: Secondary | ICD-10-CM | POA: Diagnosis not present

## 2017-06-09 DIAGNOSIS — F411 Generalized anxiety disorder: Secondary | ICD-10-CM

## 2017-06-09 MED ORDER — BUSPIRONE HCL 10 MG PO TABS: 10.0000 mg | ORAL_TABLET | Freq: Two times a day (BID) | ORAL | 2 refills | Status: DC

## 2017-06-09 NOTE — Progress Notes (Signed)
Subjective:    Patient ID: Betty Rodriguez, female    DOB: 1970-12-06, 46 y.o.   MRN: [id number]  CC: Betty Rodriguez is a 46 y.o. female who presents today for physical exam.    HPI: Feels well today. No complaints.   04/2017 Cardiology for SVT. On metoprolol; any new episodes, referral to EP. Anxiety - controlled on buspar . Doing well.        Colorectal Cancer Screening: no early family history Breast Cancer Screening: Mammogram due; biopsy right, 2017, negative for malignancy.  Cervical Cancer Screening: due Bone Health screening/DEXA for 65+: No increased fracture risk. Defer screening at this time. Lung Cancer Screening: Doesn't have 30 year pack year history and age > 35 years.       Tetanus - due         Labs: Screening labs today. Exercise: Gets regular exercise.  Alcohol use: rare Smoking/tobacco use: Nonsmoker.  Regular dental exams: UTD Wears seat belt: Yes. Skin: follows with dermatology, recently treated for rosea. [facility].   HISTORY:  Past Medical History:  Diagnosis Date  . Allergy   . Anxiety   . Chicken pox   . Kidney stones   . SVT (supraventricular tachycardia) ([place])    a. diagnosed 03/2017 in setting of hypokalemia    Past Surgical History:  Procedure Laterality Date  . BREAST BIOPSY Right 07/06/2016   us/ core  . TONSILLECTOMY  1983   Family History  Problem Relation Age of Onset  . Hyperlipidemia Father   . Hypertension Father   . Diabetes Maternal Grandmother   . Hypertension Maternal Grandmother   . Heart disease Maternal Grandmother   . Stroke Maternal Grandmother 78  . Diabetes Maternal Grandfather   . Hypertension Maternal Grandfather   . Heart disease Maternal Grandfather       ALLERGIES: Norvasc [amlodipine besylate]; Sulfa antibiotics; and Zoloft [sertraline]  Current Outpatient Prescriptions on File Prior to Visit  Medication Sig Dispense Refill  . diltiazem (CARDIZEM) 30 MG tablet Take 1 tablet (30 mg  total) by mouth every 6 (six) hours as needed (palpitations). 20 tablet 0  . fluticasone (FLONASE) 50 MCG/ACT nasal spray Place 1 spray into both nostrils daily.    [name] FE 1/20 1-20 MG-MCG tablet Take 1 tablet by mouth daily. 1 Package 11  . loratadine (CLARITIN) 10 MG tablet Take 10 mg by mouth daily.    Marland Kitchen LORazepam (ATIVAN) 0.5 MG tablet Take 1 tablet (0.5 mg total) by mouth daily as needed for anxiety. 10 tablet 0  . metoprolol tartrate (LOPRESSOR) 25 MG tablet Take 1 tablet (25 mg total) by mouth at bedtime. 90 tablet 3  . valACYclovir (VALTREX) 1000 MG tablet Take 1 tablet (1,000 mg total) by mouth 2 (two) times daily. for 3 days as needed 20 tablet 2   No current facility-administered medications on file prior to visit.     Social History  Substance Use Topics  . Smoking status: Never Smoker  . Smokeless tobacco: Never Used  . Alcohol use 0.0 oz/week     Comment: Rare     Review of Systems  Constitutional: Negative for chills, fever and unexpected weight change.  HENT: Negative for congestion.   Respiratory: Negative for cough.   Cardiovascular: Positive for palpitations. Negative for chest pain and leg swelling.  Gastrointestinal: Negative for abdominal distention, nausea and vomiting.  Genitourinary: Negative for dyspareunia.  Musculoskeletal: Negative for arthralgias and myalgias.  Skin: Negative for rash.  Neurological:  Negative for headaches.  Hematological: Negative for adenopathy.  Psychiatric/Behavioral: Negative for confusion. The patient is nervous/anxious.       Objective:    BP 130/68   Pulse 90   Temp 98.5 F (36.9 C) (Oral)   Ht 5' 3"  (1.6 m)   Wt 115 lb 3.2 oz (52.3 kg)   SpO2 98%   BMI 20.41 kg/m   BP Readings from Last 3 Encounters:  06/09/17 130/68  05/13/17 130/72  04/27/17 134/76   Wt Readings from Last 3 Encounters:  06/09/17 115 lb 3.2 oz (52.3 kg)  05/13/17 112 lb (50.8 kg)  04/27/17 110 lb 9.6 oz (50.2 kg)    Physical Exam    Constitutional: She appears well-developed and well-nourished.  Eyes: Conjunctivae are normal.  Neck: No thyroid mass and no thyromegaly present.  Cardiovascular: Normal rate, regular rhythm, normal heart sounds and normal pulses.   Pulmonary/Chest: Effort normal and breath sounds normal. She has no wheezes. She has no rhonchi. She has no rales. Right breast exhibits no inverted nipple, no mass, no nipple discharge, no skin change and no tenderness. Left breast exhibits no inverted nipple, no mass, no nipple discharge, no skin change and no tenderness. Breasts are symmetrical.  No masses or asymmetry appreciated during CBE.  Genitourinary: Uterus is not enlarged, not fixed and not tender. Cervix exhibits no motion tenderness, no discharge and no friability. Right adnexum displays no mass, no tenderness and no fullness. Left adnexum displays no mass, no tenderness and no fullness.  Genitourinary Comments: Pap performed. No CMT. Unable to appreciated ovaries.  Lymphadenopathy:       Head (right side): No submental, no submandibular, no tonsillar, no preauricular, no posterior auricular and no occipital adenopathy present.       Head (left side): No submental, no submandibular, no tonsillar, no preauricular, no posterior auricular and no occipital adenopathy present.       Right cervical: No superficial cervical, no deep cervical and no posterior cervical adenopathy present.      Left cervical: No superficial cervical, no deep cervical and no posterior cervical adenopathy present.    She has no axillary adenopathy.       Right axillary: No pectoral and no lateral adenopathy present.       Left axillary: No pectoral and no lateral adenopathy present. Neurological: She is alert.  Skin: Skin is warm and dry.  Psychiatric: She has a normal mood and affect. Her speech is normal and behavior is normal. Thought content normal.  Vitals reviewed.      Assessment & Plan:   Problem List Items Addressed  This Visit      Cardiovascular and Mediastinum   SVT (supraventricular tachycardia) ([place])    Controlled on metoprolol. Continues to follow with cardiology. Will follow.         Other   Routine physical examination - Primary    CBE and pap performed. Patient will schedule mammogram. [facility] done at [facility]. Encouraged exercise and continued surveillance with dermatology.       Relevant Orders   Comprehensive metabolic panel   Hemoglobin A1c   Lipid panel   Cytology - PAP   TSH   VITAMIN D 25 Hydroxy (Vit-D Deficiency, Fractures)   CBC with Differential/Platelet   MM SCREENING BREAST TOMO BILATERAL   Generalized anxiety disorder    Doing well on buspar. Will continue      Relevant Medications   busPIRone (BUSPAR) 10 MG tablet  I am having Ms. Strawderman maintain her loratadine, fluticasone, valACYclovir, diltiazem, LORazepam, metoprolol tartrate, JUNEL FE 1/20, and busPIRone.   Meds ordered this encounter  Medications  . busPIRone (BUSPAR) 10 MG tablet    Sig: Take 1 tablet (10 mg total) by mouth 2 (two) times daily.    Dispense:  60 tablet    Refill:  2    Order Specific Question:   Supervising Provider    Answer:   Crecencio Mc [[id number]]    Return precautions given.   Risks, benefits, and alternatives of the medications and treatment plan prescribed today were discussed, and patient expressed understanding.   Education regarding symptom management and diagnosis given to patient on AVS.   Continue to follow with Burnard Hawthorne, FNP for routine health maintenance.   [facility] and I agreed with plan.   Mable Paris, FNP

## 2017-06-09 NOTE — Assessment & Plan Note (Signed)
CBE and pap performed. Patient will schedule mammogram. Labs done at [facility]. Encouraged exercise and continued surveillance with dermatology.

## 2017-06-09 NOTE — Progress Notes (Signed)
Pre visit review using our clinic review tool, if applicable. No additional management support is needed unless otherwise documented below in the visit note. 

## 2017-06-09 NOTE — Patient Instructions (Addendum)
Tdap vaccine at local pharmacy.  We placed a referral. Mammogram this year. I asked that you call one the below locations and schedule this when it is convenient for you.   If you have dense breasts, you may ask for 3D mammogram over the traditional 2D mammogram as new evidence suggest 3D is superior. Please note that NOT all insurance companies cover 3D and you may have to pay a higher copay. You may call your insurance company to further clarify your benefits.   Options for [place]  [place], [place]  * [facility] [place] [place] * Note if you ask for 3D mammogram at this location, you must request [place], [place] location*   Health Maintenance, Female Adopting a healthy lifestyle and getting preventive care can go a long way to promote health and wellness. Talk with your health care provider about what schedule of regular examinations is right for you. This is a good chance for you to check in with your provider about disease prevention and staying healthy. In between checkups, there are plenty of things you can do on your own. Experts have done a lot of research about which lifestyle changes and preventive measures are most likely to keep you healthy. Ask your health care provider for more information. Weight and diet Eat a healthy diet  Be sure to include plenty of vegetables, fruits, low-fat dairy products, and lean protein.  Do not eat a lot of foods high in solid fats, added sugars, or salt.  Get regular exercise. This is one of the most important things you can do for your health. ? Most adults should exercise for at least 150 minutes each week. The exercise should increase your heart rate and make you sweat (moderate-intensity exercise). ? Most adults should also do strengthening exercises at least twice a week. This is in  addition to the moderate-intensity exercise.  Maintain a healthy weight  Body mass index (BMI) is a measurement that can be used to identify possible weight problems. It estimates body fat based on height and weight. Your health care provider can help determine your BMI and help you achieve or maintain a healthy weight.  For females 31 years of age and older: ? A BMI below 18.5 is considered underweight. ? A BMI of 18.5 to 24.9 is normal. ? A BMI of 25 to 29.9 is considered overweight. ? A BMI of 30 and above is considered obese.  Watch levels of cholesterol and blood lipids  You should start having your blood tested for lipids and cholesterol at 45 years of age, then have this test every 5 years.  You may need to have your cholesterol levels checked more often if: ? Your lipid or cholesterol levels are high. ? You are older than 45 years of age. ? You are at high risk for heart disease.  Cancer screening Lung Cancer  Lung cancer screening is recommended for adults 64-70 years old who are at high risk for lung cancer because of a history of smoking.  A yearly low-dose CT scan of the lungs is recommended for people who: ? Currently smoke. ? Have quit within the past 15 years. ? Have at least a 30-pack-year history of smoking. A pack year is smoking an average of one pack of cigarettes a day for 1 year.  Yearly screening should continue  until it has been 15 years since you quit.  Yearly screening should stop if you develop a health problem that would prevent you from having lung cancer treatment.  Breast Cancer  Practice breast self-awareness. This means understanding how your breasts normally appear and feel.  It also means doing regular breast self-exams. Let your health care provider know about any changes, no matter how small.  If you are in your 20s or 30s, you should have a clinical breast exam (CBE) by a health care provider every 1-3 years as part of a regular health  exam.  If you are 67 or older, have a CBE every year. Also consider having a breast X-ray (mammogram) every year.  If you have a family history of breast cancer, talk to your health care provider about genetic screening.  If you are at high risk for breast cancer, talk to your health care provider about having an MRI and a mammogram every year.  Breast cancer gene (BRCA) assessment is recommended for women who have family members with BRCA-related cancers. BRCA-related cancers include: ? Breast. ? Ovarian. ? Tubal. ? Peritoneal cancers.  Results of the assessment will determine the need for genetic counseling and BRCA1 and BRCA2 testing.  Cervical Cancer Your health care provider may recommend that you be screened regularly for cancer of the pelvic organs (ovaries, uterus, and vagina). This screening involves a pelvic examination, including checking for microscopic changes to the surface of your cervix (Pap test). You may be encouraged to have this screening done every 3 years, beginning at age 24.  For women ages 99-65, health care providers may recommend pelvic exams and Pap testing every 3 years, or they may recommend the Pap and pelvic exam, combined with testing for human papilloma virus (HPV), every 5 years. Some types of HPV increase your risk of cervical cancer. Testing for HPV may also be done on women of any age with unclear Pap test results.  Other health care providers may not recommend any screening for nonpregnant women who are considered low risk for pelvic cancer and who do not have symptoms. Ask your health care provider if a screening pelvic exam is right for you.  If you have had past treatment for cervical cancer or a condition that could lead to cancer, you need Pap tests and screening for cancer for at least 20 years after your treatment. If Pap tests have been discontinued, your risk factors (such as having a new sexual partner) need to be reassessed to determine if  screening should resume. Some women have medical problems that increase the chance of getting cervical cancer. In these cases, your health care provider may recommend more frequent screening and Pap tests.  Colorectal Cancer  This type of cancer can be detected and often prevented.  Routine colorectal cancer screening usually begins at 45 years of age and continues through 45 years of age.  Your health care provider may recommend screening at an earlier age if you have risk factors for colon cancer.  Your health care provider may also recommend using home test kits to check for hidden blood in the stool.  A small camera at the end of a tube can be used to examine your colon directly (sigmoidoscopy or colonoscopy). This is done to check for the earliest forms of colorectal cancer.  Routine screening usually begins at age 22.  Direct examination of the colon should be repeated every 5-10 years through 45 years of age. However, you may need  to be screened more often if early forms of precancerous polyps or small growths are found.  Skin Cancer  Check your skin from head to toe regularly.  Tell your health care provider about any new moles or changes in moles, especially if there is a change in a mole's shape or color.  Also tell your health care provider if you have a mole that is larger than the size of a pencil eraser.  Always use sunscreen. Apply sunscreen liberally and repeatedly throughout the day.  Protect yourself by wearing long sleeves, pants, a wide-brimmed hat, and sunglasses whenever you are outside.  Heart disease, diabetes, and high blood pressure  High blood pressure causes heart disease and increases the risk of stroke. High blood pressure is more likely to develop in: ? People who have blood pressure in the high end of the normal range (130-139/85-89 mm Hg). ? People who are overweight or obese. ? People who are African American.  If you are 23-58 years of age, have  your blood pressure checked every 3-5 years. If you are 76 years of age or older, have your blood pressure checked every year. You should have your blood pressure measured twice-once when you are at a hospital or clinic, and once when you are not at a hospital or clinic. Record the average of the two measurements. To check your blood pressure when you are not at a hospital or clinic, you can use: ? An automated blood pressure machine at a pharmacy. ? A home blood pressure monitor.  If you are between 80 years and 3 years old, ask your health care provider if you should take aspirin to prevent strokes.  Have regular diabetes screenings. This involves taking a blood sample to check your fasting blood sugar level. ? If you are at a normal weight and have a low risk for diabetes, have this test once every three years after 45 years of age. ? If you are overweight and have a high risk for diabetes, consider being tested at a younger age or more often. Preventing infection Hepatitis B  If you have a higher risk for hepatitis B, you should be screened for this virus. You are considered at high risk for hepatitis B if: ? You were born in a country where hepatitis B is common. Ask your health care provider which countries are considered high risk. ? Your parents were born in a high-risk country, and you have not been immunized against hepatitis B (hepatitis B vaccine). ? You have HIV or AIDS. ? You use needles to inject street drugs. ? You live with someone who has hepatitis B. ? You have had sex with someone who has hepatitis B. ? You get hemodialysis treatment. ? You take certain medicines for conditions, including cancer, organ transplantation, and autoimmune conditions.  Hepatitis C  Blood testing is recommended for: ? Everyone born from [birth year] through 1964. ? Anyone with known risk factors for hepatitis C.  Sexually transmitted infections (STIs)  You should be screened for sexually  transmitted infections (STIs) including gonorrhea and chlamydia if: ? You are sexually active and are younger than 45 years of age. ? You are older than 45 years of age and your health care provider tells you that you are at risk for this type of infection. ? Your sexual activity has changed since you were last screened and you are at an increased risk for chlamydia or gonorrhea. Ask your health care provider if you are at risk.  If you do not have HIV, but are at risk, it may be recommended that you take a prescription medicine daily to prevent HIV infection. This is called pre-exposure prophylaxis (PrEP). You are considered at risk if: ? You are sexually active and do not regularly use condoms or know the HIV status of your partner(s). ? You take drugs by injection. ? You are sexually active with a partner who has HIV.  Talk with your health care provider about whether you are at high risk of being infected with HIV. If you choose to begin PrEP, you should first be tested for HIV. You should then be tested every 3 months for as long as you are taking PrEP. Pregnancy  If you are premenopausal and you may become pregnant, ask your health care provider about preconception counseling.  If you may become pregnant, take 400 to 800 micrograms (mcg) of folic acid every day.  If you want to prevent pregnancy, talk to your health care provider about birth control (contraception). Osteoporosis and menopause  Osteoporosis is a disease in which the bones lose minerals and strength with aging. This can result in serious bone fractures. Your risk for osteoporosis can be identified using a bone density scan.  If you are 69 years of age or older, or if you are at risk for osteoporosis and fractures, ask your health care provider if you should be screened.  Ask your health care provider whether you should take a calcium or vitamin D supplement to lower your risk for osteoporosis.  Menopause may have  certain physical symptoms and risks.  Hormone replacement therapy may reduce some of these symptoms and risks. Talk to your health care provider about whether hormone replacement therapy is right for you. Follow these instructions at home:  Schedule regular health, dental, and eye exams.  Stay current with your immunizations.  Do not use any tobacco products including cigarettes, chewing tobacco, or electronic cigarettes.  If you are pregnant, do not drink alcohol.  If you are breastfeeding, limit how much and how often you drink alcohol.  Limit alcohol intake to no more than 1 drink per day for nonpregnant women. One drink equals 12 ounces of beer, 5 ounces of wine, or 1 ounces of hard liquor.  Do not use street drugs.  Do not share needles.  Ask your health care provider for help if you need support or information about quitting drugs.  Tell your health care provider if you often feel depressed.  Tell your health care provider if you have ever been abused or do not feel safe at home. This information is not intended to replace advice given to you by your health care provider. Make sure you discuss any questions you have with your health care provider. Document Released: 06/01/2011 Document Revised: 04/23/2016 Document Reviewed: 08/20/2015 Elsevier Interactive Patient Education  [employer].

## 2017-06-09 NOTE — Addendum Note (Signed)
Addended by: Burnard Hawthorne on: 06/09/2017 08:56 AM   Modules accepted: Orders

## 2017-06-09 NOTE — Assessment & Plan Note (Signed)
Controlled on metoprolol. Continues to follow with cardiology. Will follow.

## 2017-06-09 NOTE — Assessment & Plan Note (Signed)
Doing well on buspar. Will continue

## 2017-06-10 LAB — CYTOLOGY - PAP
Diagnosis: NEGATIVE
HPV: NOT DETECTED

## 2017-06-14 ENCOUNTER — Ambulatory Visit: Payer: 59 | Admitting: Family

## 2017-06-15 LAB — CBC WITH DIFFERENTIAL/PLATELET
Basophils Absolute: 0 10*3/uL (ref 0.0–0.2)
Basos: 1 %
EOS (ABSOLUTE): 0.1 10*3/uL (ref 0.0–0.4)
Eos: 1 %
Hematocrit: 38.6 % (ref 34.0–46.6)
Hemoglobin: 12.9 g/dL (ref 11.1–15.9)
Immature Grans (Abs): 0 10*3/uL (ref 0.0–0.1)
Immature Granulocytes: 0 %
Lymphocytes Absolute: 2 10*3/uL (ref 0.7–3.1)
Lymphs: 30 %
MCH: 30.5 pg (ref 26.6–33.0)
MCHC: 33.4 g/dL (ref 31.5–35.7)
MCV: 91 fL (ref 79–97)
Monocytes Absolute: 0.3 10*3/uL (ref 0.1–0.9)
Monocytes: 4 %
Neutrophils Absolute: 4.2 10*3/uL (ref 1.4–7.0)
Neutrophils: 64 %
Platelets: 270 10*3/uL (ref 150–379)
RBC: 4.23 x10E6/uL (ref 3.77–5.28)
RDW: 13.2 % (ref 12.3–15.4)
WBC: 6.5 10*3/uL (ref 3.4–10.8)

## 2017-06-16 ENCOUNTER — Encounter: Payer: Self-pay | Admitting: Family

## 2017-06-16 LAB — LIPID PANEL
Chol/HDL Ratio: 2.6 ratio (ref 0.0–4.4)
Cholesterol, Total: 188 mg/dL (ref 100–199)
HDL: 72 mg/dL (ref >39)
LDL Calculated: 93 mg/dL (ref 0–99)
Triglycerides: 114 mg/dL (ref 0–149)
VLDL Cholesterol Cal: 23 mg/dL (ref 5–40)

## 2017-06-16 LAB — COMPREHENSIVE METABOLIC PANEL
ALT: 12 IU/L (ref 0–32)
AST: 17 IU/L (ref 0–40)
Albumin/Globulin Ratio: 1.8 (ref 1.2–2.2)
Albumin: 4.5 g/dL (ref 3.5–5.5)
Alkaline Phosphatase: 34 IU/L — ABNORMAL LOW (ref 39–117)
BUN/Creatinine Ratio: 13 (ref 9–23)
BUN: 12 mg/dL (ref 6–24)
Bilirubin Total: 0.6 mg/dL (ref 0.0–1.2)
CO2: 21 mmol/L (ref 20–29)
Calcium: 8.9 mg/dL (ref 8.7–10.2)
Chloride: 102 mmol/L (ref 96–106)
Creatinine, Ser: 0.91 mg/dL (ref 0.57–1.00)
GFR calc Af Amer: 88 mL/min/{1.73_m2} (ref >59)
GFR calc non Af Amer: 76 mL/min/{1.73_m2} (ref >59)
Globulin, Total: 2.5 g/dL (ref 1.5–4.5)
Glucose: 92 mg/dL (ref 65–99)
Potassium: 4.5 mmol/L (ref 3.5–5.2)
Sodium: 138 mmol/L (ref 134–144)
Total Protein: 7 g/dL (ref 6.0–8.5)

## 2017-06-16 LAB — VITAMIN D 25 HYDROXY (VIT D DEFICIENCY, FRACTURES): Vit D, 25-Hydroxy: 30.8 ng/mL (ref 30.0–100.0)

## 2017-06-16 LAB — TSH: TSH: 1.49 u[IU]/mL (ref 0.450–4.500)

## 2017-06-16 LAB — HEMOGLOBIN A1C
Est. average glucose Bld gHb Est-mCnc: 111 mg/dL
Hgb A1c MFr Bld: 5.5 % (ref 4.8–5.6)

## 2017-07-06 ENCOUNTER — Other Ambulatory Visit: Payer: Self-pay | Admitting: *Deleted

## 2017-07-06 ENCOUNTER — Other Ambulatory Visit: Payer: Self-pay

## 2017-07-06 DIAGNOSIS — F411 Generalized anxiety disorder: Secondary | ICD-10-CM

## 2017-07-06 MED ORDER — BUSPIRONE HCL 10 MG PO TABS: 10.0000 mg | ORAL_TABLET | Freq: Two times a day (BID) | ORAL | 1 refills | Status: DC

## 2017-07-06 MED ORDER — METOPROLOL TARTRATE 25 MG PO TABS: 25.0000 mg | ORAL_TABLET | Freq: Every day | ORAL | 1 refills | Status: DC

## 2017-07-07 ENCOUNTER — Encounter: Payer: Self-pay | Admitting: Family

## 2017-07-08 ENCOUNTER — Encounter: Payer: Self-pay | Admitting: Physician Assistant

## 2017-07-12 ENCOUNTER — Encounter: Payer: Self-pay | Admitting: Family

## 2017-07-12 ENCOUNTER — Telehealth: Payer: Self-pay | Admitting: Family

## 2017-07-12 DIAGNOSIS — F411 Generalized anxiety disorder: Secondary | ICD-10-CM

## 2017-07-12 MED ORDER — BUSPIRONE HCL 10 MG PO TABS: 10.0000 mg | ORAL_TABLET | Freq: Two times a day (BID) | ORAL | 1 refills | Status: DC

## 2017-07-12 NOTE — Telephone Encounter (Signed)
Pt called about medication of busPIRone (BUSPAR) 10 MG tablet needs to be a mail order. Please advise?  Call pt @ [phone]. Thank you!

## 2017-07-12 NOTE — Telephone Encounter (Signed)
I have completed this already via mychart. thanks

## 2017-07-13 ENCOUNTER — Encounter: Payer: Self-pay | Admitting: Cardiovascular Disease

## 2017-07-13 ENCOUNTER — Ambulatory Visit (INDEPENDENT_AMBULATORY_CARE_PROVIDER_SITE_OTHER): Payer: 59 | Admitting: Cardiovascular Disease

## 2017-07-13 VITALS — BP 120/70 | HR 72 | Ht 60.0 in | Wt 114.5 lb

## 2017-07-13 DIAGNOSIS — F411 Generalized anxiety disorder: Secondary | ICD-10-CM

## 2017-07-13 DIAGNOSIS — I471 Supraventricular tachycardia: Secondary | ICD-10-CM

## 2017-07-13 DIAGNOSIS — I10 Essential (primary) hypertension: Secondary | ICD-10-CM | POA: Diagnosis not present

## 2017-07-13 MED ORDER — METOPROLOL SUCCINATE ER 25 MG PO TB24: 25.0000 mg | ORAL_TABLET | Freq: Every day | ORAL | 11 refills | Status: DC

## 2017-07-13 NOTE — Patient Instructions (Addendum)
Read about atrial tachycardia, sinus tachycardia Stay hydrated, don't avoid salt  Alive cor Search "smartphone EKG"   Medication Instructions:   Try the metoprolol succinate one a day If needed try 1 1/2 pills   Labwork:  No new labs needed  Testing/Procedures:  No further testing at this time   Follow-Up: It was a pleasure seeing you in the office today. Please call us if you have new issues that need to be addressed before your next appt.  [phone]  Your physician wants you to follow-up in: 6 months.  You will receive a reminder letter in the mail two months in advance. If you don't receive a letter, please call our office to schedule the follow-up appointment.  If you need a refill on your cardiac medications before your next appointment, please call your pharmacy.

## 2017-07-13 NOTE — Progress Notes (Signed)
Cardiology Office Note  Date:  07/13/2017   ID:  Betty Rodriguez, Betty Rodriguez Jun 12, 1971, MRN [id number]  PCP:  Betty Hawthorne, FNP   Chief Complaint  Patient presents with  . OTHER    3 month f/u no complaints today. Meds reviewed verbally with pt.    HPI:  Betty Rodriguez is a 46 y.o. female with history of SVT in 03/2017,  HTN,  anxiety  Who presents for follow-up of her arrhythmia/SVT admission to [facility] from 5/23-5/24 for allergic reaction and palpations noted to have SVT  episodes of tachycardia 120s 4 to 5 Episodes Lasting 10 minutes at least One episode of dizziness with tachycardia she was walking to her car from shopping store on a hot day  Monitoring the heart rate using her phone She is taking metoprolol tartrate 25 m owng 4 PM does not take a second dose  EKG personally reviewed by myself on todays visit Shows normal sinus rhythm rate 72 bpm short PR interval  Other past medical history reviewed   saw PCP on 5/21 for elevated BP readings as well as anxiety.  She was placed on amlodipine and Zoloft (previously on Buspar years ago without issues). She took her first dose of each without issues on 5/21 and took her 2nd doses on Tuesday, 5/22. This was followed by the development of urticaria, pruritis, and palpitations with a self documented heart rate in the 150s bpm. She presented to urgent care were she reports having a heart rate in the 150s and was given steroids, Benadryl, and advised to go home and take Pepcid. Upon arrival at home she continued to note palpitations and had a heart rate in the 150s bpm. She presented to [facility] on 5/22    EKG documenting sinus tachycardia in the 110s bpm followed by development of SVT on subsequent EKG into the 180s bpm. She spontaneously converted to sinus rhythm without intervention.    hypokalemic at 3.3 that was repleted to 4.3. TSH normal, urine drug screen negative, magnesium 1.7 that was repleted to 2.2, troponin negative,  unremarkable CBC. She was started on metoprolol 25 mg bid given her BP and heart rate, which was titrated to 50 mg bid at time of discharge. Her amlodipine and Zoloft were held given the possible allergic reaction.    Echocardiogram performed on 6/13 showed normal LV systolic function with EF of 55-60%, normal wall motion, normal LV diastolic function parameters.    PMH:   has a past medical history of Allergy; Anxiety; Chicken pox; Kidney stones; and SVT (supraventricular tachycardia) ([place]).  PSH:    Past Surgical History:  Procedure Laterality Date  . BREAST BIOPSY Right 07/06/2016   us/ core  . TONSILLECTOMY  1983    Current Outpatient Prescriptions  Medication Sig Dispense Refill  . busPIRone (BUSPAR) 10 MG tablet Take 1 tablet (10 mg total) by mouth 2 (two) times daily. 180 tablet 1  . diltiazem (CARDIZEM) 30 MG tablet Take 1 tablet (30 mg total) by mouth every 6 (six) hours as needed (palpitations). 20 tablet 0  . fluticasone (FLONASE) 50 MCG/ACT nasal spray Place 1 spray into both nostrils daily.    Betty Rodriguez FE 1/20 1-20 MG-MCG tablet Take 1 tablet by mouth daily. 1 Package 11  . loratadine (CLARITIN) 10 MG tablet Take 10 mg by mouth daily.    Marland Kitchen LORazepam (ATIVAN) 0.5 MG tablet Take 1 tablet (0.5 mg total) by mouth daily as needed for anxiety. 10 tablet 0  .  valACYclovir (VALTREX) 1000 MG tablet Take 1 tablet (1,000 mg total) by mouth 2 (two) times daily. for 3 days as needed 20 tablet 2  . metoprolol succinate (TOPROL-XL) 25 MG 24 hr tablet Take 1 tablet (25 mg total) by mouth daily. Take with or immediately following a meal. 30 tablet 11   No current facility-administered medications for this visit.      Allergies:   Norvasc [amlodipine besylate]; Sulfa antibiotics; and Zoloft [sertraline]   Social History:  The patient  reports that she has never smoked. She has never used smokeless tobacco. She reports that she drinks alcohol. She reports that she does not use drugs.    Family History:   family history includes Diabetes in her maternal grandfather and maternal grandmother; Heart disease in her maternal grandfather and maternal grandmother; Hyperlipidemia in her father; Hypertension in her father, maternal grandfather, and maternal grandmother; Stroke (age of onset: 90) in her maternal grandmother.    Review of Systems: Review of Systems  Constitutional: Negative.   Respiratory: Negative.   Cardiovascular: Negative.   Gastrointestinal: Negative.   Musculoskeletal: Negative.   Neurological: Negative.   Psychiatric/Behavioral: Negative.   All other systems reviewed and are negative.    PHYSICAL EXAM: VS:  BP 120/70 (BP Location: Left Arm, Patient Position: Sitting, Cuff Size: Normal)   Pulse 72   Ht 5' (1.524 m)   Wt 114 lb 8 oz (51.9 kg)   BMI 22.36 kg/m  , BMI Body mass index is 22.36 kg/m. GEN: Well nourished, well developed, in no acute distress  HEENT: normal  Neck: no JVD, carotid bruits, or masses Cardiac: RRR; no murmurs, rubs, or gallops,no edema  Respiratory:  clear to auscultation bilaterally, normal work of breathing GI: soft, nontender, nondistended, + BS MS: no deformity or atrophy  Skin: warm and dry, no rash Neuro:  Strength and sensation are intact Psych: euthymic mood, full affect    Recent Labs: 04/27/2017: Magnesium 2.2 06/14/2017: ALT 12; BUN 12; Creatinine, Ser 0.91; Hemoglobin 12.9; Platelets 270; Potassium 4.5; Sodium 138; TSH 1.490    Lipid Panel Lab Results  Component Value Date   CHOL 188 06/14/2017   HDL 72 06/14/2017   LDLCALC 93 06/14/2017   TRIG 114 06/14/2017      Wt Readings from Last 3 Encounters:  07/13/17 114 lb 8 oz (51.9 kg)  06/09/17 115 lb 3.2 oz (52.3 kg)  05/13/17 112 lb (50.8 kg)       ASSESSMENT AND PLAN:  SVT (supraventricular tachycardia) ([place]) - Plan: EKG 12-Lead Episode of SVT in the hospital, no further episodes since that time Likely secondary to allergic  reaction  Hypertension, unspecified type - Plan: EKG 12-Lead Blood pressure well controlled on low-dose metoprolol Concern for orthostasis, will need to be careful not to use high doses of medication   Atrial tachycardia ([facility]) Reports having episodes of tachycardia 120 bpm concerning for atrial tachycardia  Unable to exclude sinus tachycardia  Seems to have acute onset and abrupt cessation more consistent with atrial tachycardia  We have offered a monitor, she will check the price Recommended she stop the metoprolol tartrate and changed to metoprolol succinate 25 mg daily Other options include trying bystolic  if she has side effects of fatigue on metoprolol   Generalized anxiety disorder Managed by primary care   Total encounter time more than 25 minutes  Greater than 50% was spent in counseling and coordination of care with the patient   Disposition:   F/U  6 months   Orders Placed This Encounter  Procedures  . EKG 12-Lead     Signed, Esmond Plants, M.D., Ph.D. 07/13/2017  [place], [place]

## 2017-08-09 ENCOUNTER — Ambulatory Visit
Admission: RE | Admit: 2017-08-09 | Discharge: 2017-08-09 | Disposition: A | Discharge status: Home / Self Care | Payer: 59 | Source: Ambulatory Visit | Attending: Family | Admitting: Family

## 2017-08-09 DIAGNOSIS — Z0000 Encounter for general adult medical examination without abnormal findings: Secondary | ICD-10-CM

## 2017-08-09 DIAGNOSIS — Z1231 Encounter for screening mammogram for malignant neoplasm of breast: Secondary | ICD-10-CM | POA: Diagnosis present

## 2017-08-16 ENCOUNTER — Ambulatory Visit: Payer: 59 | Admitting: Cardiovascular Disease

## 2017-09-23 ENCOUNTER — Other Ambulatory Visit: Payer: Self-pay | Admitting: Family

## 2017-09-23 ENCOUNTER — Other Ambulatory Visit: Payer: Self-pay | Admitting: Cardiovascular Disease

## 2017-09-23 DIAGNOSIS — F411 Generalized anxiety disorder: Secondary | ICD-10-CM

## 2017-09-23 MED ORDER — METOPROLOL SUCCINATE ER 25 MG PO TB24: 25.0000 mg | ORAL_TABLET | ORAL | 3 refills | Status: DC

## 2017-09-24 ENCOUNTER — Other Ambulatory Visit: Payer: Self-pay

## 2017-09-24 MED ORDER — METOPROLOL SUCCINATE ER 25 MG PO TB24: 25.0000 mg | ORAL_TABLET | Freq: Every day | ORAL | 3 refills | Status: DC

## 2017-10-08 ENCOUNTER — Telehealth: Payer: Self-pay | Admitting: Cardiovascular Disease

## 2017-10-08 ENCOUNTER — Ambulatory Visit: Payer: 59 | Admitting: Cardiovascular Disease

## 2017-10-08 ENCOUNTER — Encounter: Payer: Self-pay | Admitting: Cardiovascular Disease

## 2017-10-08 VITALS — BP 180/90 | HR 100 | Ht 63.0 in | Wt 114.5 lb

## 2017-10-08 DIAGNOSIS — F411 Generalized anxiety disorder: Secondary | ICD-10-CM | POA: Diagnosis not present

## 2017-10-08 DIAGNOSIS — I471 Supraventricular tachycardia: Secondary | ICD-10-CM | POA: Insufficient documentation

## 2017-10-08 DIAGNOSIS — I4719 Other supraventricular tachycardia: Secondary | ICD-10-CM | POA: Insufficient documentation

## 2017-10-08 DIAGNOSIS — I10 Essential (primary) hypertension: Secondary | ICD-10-CM

## 2017-10-08 MED ORDER — AMLODIPINE BESYLATE 5 MG PO TABS: 5.0000 mg | ORAL_TABLET | Freq: Every day | ORAL | 3 refills | Status: DC

## 2017-10-08 NOTE — Telephone Encounter (Signed)
Pt boyfriend states pt has been having episoded of SVTs. States her HR is 165, she did take an "emergency pill". States her BP was 160/105 earlier today, HR 164. States she has pressure in her head, fells weak. Denies any chest pain. States she has a "little bit" of SOB.

## 2017-10-08 NOTE — Telephone Encounter (Signed)
Patients blood pressures is 168/103 and heart rate is 112. After discussing with Dr. Rockey Situ instructed them to come in for EKG and possible physician visit. They are on the way in.

## 2017-10-08 NOTE — Patient Instructions (Addendum)
Medication Instructions:   For atrial tachycardia: Rate 100 to 160: Take 1 to 2 metoprolol tartrate Ok to take your metoprolol succinate early  They do make a 4 to 6 hour pill (prorpanolol)  For SVT (supraventricular tachycardia) (max rate >180) Take diltiazem 2 pills, ok to mix with metoprolol tartrate Ask EMTs for adenosine   Diltiazem 30 can be used for high blood pressure/when heart rate is not slow   Take amlodipine 2.5 up to 5 mg for high blood pressure   Labwork:  No new labs needed  Testing/Procedures:  No further testing at this time   Follow-Up: It was a pleasure seeing you in the office today. Please call us if you have new issues that need to be addressed before your next appt.  [phone]  Your physician wants you to follow-up in:  As needed  If you need a refill on your cardiac medications before your next appointment, please call your pharmacy.

## 2017-10-08 NOTE — Progress Notes (Signed)
Cardiology Office Note  Date:  10/08/2017   ID:  Betty Rodriguez, DOB 1971-09-13, MRN [id number]  PCP:  Burnard Hawthorne, FNP   Chief Complaint  Patient presents with  . other    Elevated BPand HR. Meds reviewed verbally with pt.    HPI:  Betty Rodriguez is a 46 y.o. female with history of SVT in 03/2017,  HTN,  anxiety  Who presents for follow-up of her arrhythmia/SVT admission to [facility] from 5/23-5/24 for allergic reaction and palpations noted to have SVT  She presents today with her boyfriend, he provided much of the history She contacted our office today for acute onset of tachycardia rate 160 bpm Today was watching TV, ate pizza, 2:30 p.m.  acute tachycardia Rate 165,  Took diltiazem Took metoprolol xl 25 She did not want to go to the emergency room, was very symptomatic and anxious It was recommended by our office that she take her metoprolol succinate 25 mg early On follow-up phone call, still anxious, tachycardia, told to come into the office for further evaluation  EKG on arrival here showed sinus tachycardia rate 100 bpm no significant ST or T wave changes  She reports that recently blood pressure has been running high, mainly this week 166/103 recently in the setting of headache   On her last clinic visit she reported several episodes of tachycardia 120s 4 to 5 Episodes, Lasting 10 minutes at least concerning for atrial tachycardia  Prior to recent onset this week she had been taking metoprolol succinate 12.5 mg daily with good blood pressure and heart rate control Denies any excessive stressors at work this past week  Other past medical history reviewed   saw PCP on 5/21 for elevated BP readings as well as anxiety.  She was placed on amlodipine and Zoloft (previously on Buspar years ago without issues). She took her first dose of each without issues on 5/21 and took her 2nd doses on Tuesday, 5/22. This was followed by the development of urticaria, pruritis,  and palpitations with a self documented heart rate in the 150s bpm. She presented to urgent care were she reports having a heart rate in the 150s and was given steroids, Benadryl, and advised to go home and take Pepcid. Upon arrival at home she continued to note palpitations and had a heart rate in the 150s bpm. She presented to [facility] on 5/22    EKG documenting sinus tachycardia in the 110s bpm followed by development of SVT on subsequent EKG into the 180s bpm. She spontaneously converted to sinus rhythm without intervention.    hypokalemic at 3.3 that was repleted to 4.3. TSH normal, urine drug screen negative, magnesium 1.7 that was repleted to 2.2, troponin negative, unremarkable CBC. She was started on metoprolol 25 mg bid given her BP and heart rate, which was titrated to 50 mg bid at time of discharge. Her amlodipine and Zoloft were held given the possible allergic reaction.    Echocardiogram performed on 6/13 showed normal LV systolic function with EF of 55-60%, normal wall motion, normal LV diastolic function parameters.    PMH:   has a past medical history of Allergy, Anxiety, Chicken pox, Kidney stones, and SVT (supraventricular tachycardia) ([place]).  PSH:    Past Surgical History:  Procedure Laterality Date  . BREAST BIOPSY Right 07/06/2016   us/ core/neg  . TONSILLECTOMY  1983    Current Outpatient Medications  Medication Sig Dispense Refill  . busPIRone (BUSPAR) 10 MG tablet TAKE 1  TABLET BY MOUTH TWO  TIMES DAILY 180 tablet 1  . diltiazem (CARDIZEM) 30 MG tablet Take 1 tablet (30 mg total) by mouth every 6 (six) hours as needed (palpitations). 20 tablet 0  . JUNEL FE 1/20 1-20 MG-MCG tablet Take 1 tablet by mouth daily. 1 Package 11  . metoprolol succinate (TOPROL-XL) 25 MG 24 hr tablet Take 1 tablet (25 mg total) by mouth daily. Take with or immediately following a meal. 90 tablet 3  . valACYclovir (VALTREX) 1000 MG tablet Take 1 tablet (1,000 mg total) by mouth 2 (two) times  daily. for 3 days as needed 20 tablet 2  . amLODipine (NORVASC) 5 MG tablet Take 1 tablet (5 mg total) daily by mouth. 30 tablet 3   No current facility-administered medications for this visit.      Allergies:   Sulfa antibiotics and Zoloft [sertraline]   Social History:  The patient  reports that  has never smoked. she has never used smokeless tobacco. She reports that she drinks alcohol. She reports that she does not use drugs.   Family History:   family history includes Diabetes in her maternal grandfather and maternal grandmother; Heart disease in her maternal grandfather and maternal grandmother; Hyperlipidemia in her father; Hypertension in her father, maternal grandfather, and maternal grandmother; Stroke (age of onset: 55) in her maternal grandmother.    Review of Systems: Review of Systems  Constitutional: Negative.   Respiratory: Negative.   Cardiovascular: Negative.        Tachycardia  Gastrointestinal: Negative.   Musculoskeletal: Negative.   Neurological: Negative.   Psychiatric/Behavioral: The patient is nervous/anxious.   All other systems reviewed and are negative.    PHYSICAL EXAM: VS:  BP (!) 180/90 (BP Location: Left Arm, Patient Position: Sitting, Cuff Size: Normal)   Pulse 100   Ht 5' 3"  (1.6 m)   Wt 114 lb 8 oz (51.9 kg)   BMI 20.28 kg/m  , BMI Body mass index is 20.28 kg/m. GEN: Well nourished, well developed, .  Appears anxious HEENT: normal  Neck: no JVD, carotid bruits, or masses Cardiac: RRR; no murmurs, rubs, or gallops,no edema  Respiratory:  clear to auscultation bilaterally, normal work of breathing GI: soft, nontender, nondistended, + BS MS: no deformity or atrophy  Skin: warm and dry, no rash Neuro:  Strength and sensation are intact Psych:  full affect, appears somewhat anxious    Recent Labs: 04/27/2017: Magnesium 2.2 06/14/2017: ALT 12; BUN 12; Creatinine, Ser 0.91; Hemoglobin 12.9; Platelets 270; Potassium 4.5; Sodium 138; TSH  1.490    Lipid Panel Lab Results  Component Value Date   CHOL 188 06/14/2017   HDL 72 06/14/2017   LDLCALC 93 06/14/2017   TRIG 114 06/14/2017      Wt Readings from Last 3 Encounters:  10/08/17 114 lb 8 oz (51.9 kg)  07/13/17 114 lb 8 oz (51.9 kg)  06/09/17 115 lb 3.2 oz (52.3 kg)       ASSESSMENT AND PLAN:  SVT (supraventricular tachycardia) ([place]) - Plan: EKG 12-Lead Tachycardia rate 160 bpm today, unable to exclude SVT but more concerning for atrial tachycardia She took metoprolol with a diltiazem and rate has slowly improved now down to 100 bpm  Hypertension, unspecified type - Plan: EKG 12-Lead Previously taking one half dose metoprolol but has had elevated blood pressure this week Etiology unclear, unable to exclude anxiety or stress Was taking full metoprolol succinate 25 mg but again reports blood pressure running high   Atrial  tachycardia ([place]) On previous office visit she reported having periodic tachycardia up to 120 bpm Long discussion today concerning metoprolol succinate, tartrate Suggested she stay on her metoprolol succinate 25 mg daily Take extra metoprolol tartrate as needed for breakthrough tachycardia 2-week event monitor was offered and she has declined Recommended she call us if she would like a monitor.  We did discuss a consultation with EP.  Suggested monitor prior to this as the information would be useful in guiding her management  Generalized anxiety disorder Managed by primary care  HTN Previously well controlled, now elevated this past week Concern for stress or anxiety Long discussion concerning blood pressure, she has indicated she would like to have something to take as needed As she is having no issues with diltiazem, she would like to retry amlodipine Recommended she try 2.5 up to 5 mg amlodipine only as needed for systolic pressure consistently greater than 160   Total encounter time more than 45 minutes  Greater than 50% was  spent in counseling and coordination of care with the patient   Disposition:   F/U  6 months   Orders Placed This Encounter  Procedures  . EKG 12-Lead     Signed, Esmond Plants, M.D., Ph.D. 10/08/2017  [place], [place]

## 2017-10-08 NOTE — Telephone Encounter (Signed)
Patients boyfriend called in because her heart rate is 165. Patient gave permission for me to discuss her health information with him. 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 He reports that her blood pressures have been running high lately more than usual and they were concerned. Instructed him to have her take her metoprolol at this time. Instructed him to have her drink some cold water and to monitor blood pressure and heart rate. He states that previous times they went to ER and she was admitted overnight and they really wanted to avoid that large bill. Reviewed options with them and they want to try taking care of this at home. Strongly advised that she been seen but they want to wait. Will check with MD and call back.

## 2017-10-11 NOTE — Telephone Encounter (Signed)
Patient came into office for visit with Dr. Rockey Situ. See physician note.

## 2018-04-28 ENCOUNTER — Other Ambulatory Visit: Payer: Self-pay | Admitting: Family

## 2018-05-06 ENCOUNTER — Encounter: Payer: 59 | Admitting: Family

## 2018-05-13 ENCOUNTER — Telehealth: Payer: Self-pay

## 2018-05-13 ENCOUNTER — Encounter: Payer: 59 | Admitting: Family

## 2018-05-13 NOTE — Telephone Encounter (Signed)
Copied from [place] [id number]. Topic: General - Other >> May 13, 2018  8:57 AM Oneta Rack wrote: Relation to pt: self  Call back number: [phone]   Reason for call:  Patient called yesterday adamant about speaking to "[facility] Administrator, patient refused to go into details, patient has a scheduled appointment today at 05/13/18 at 1:30pm and would like to speak with PA prior to appointment, please advise   ([place] following up to see if CRM was addressed and noticed there was no CRM)

## 2018-05-13 NOTE — Telephone Encounter (Signed)
Pt called today to cancel this appt.  She states her boyfriend just got out of the hospital with a blood clot on the brain, and now he is having headache and some other symptoms.  She is going to have to stay with him and may have to take him to the doctor. Pt states she will reschedule on mychart

## 2018-05-23 ENCOUNTER — Encounter: Payer: Self-pay | Admitting: Family

## 2018-05-23 ENCOUNTER — Other Ambulatory Visit: Payer: Self-pay

## 2018-05-23 ENCOUNTER — Other Ambulatory Visit (HOSPITAL_COMMUNITY)
Admission: RE | Admit: 2018-05-23 | Discharge: 2018-05-23 | Disposition: A | Discharge status: Home / Self Care | Payer: Managed Care, Other (non HMO) | Source: Ambulatory Visit | Attending: Family | Admitting: Family

## 2018-05-23 ENCOUNTER — Ambulatory Visit (INDEPENDENT_AMBULATORY_CARE_PROVIDER_SITE_OTHER): Payer: Managed Care, Other (non HMO) | Admitting: Family

## 2018-05-23 VITALS — BP 102/68 | HR 75 | Temp 98.8°F | Wt 123.0 lb

## 2018-05-23 DIAGNOSIS — I471 Supraventricular tachycardia: Secondary | ICD-10-CM | POA: Diagnosis not present

## 2018-05-23 DIAGNOSIS — Z23 Encounter for immunization: Secondary | ICD-10-CM

## 2018-05-23 DIAGNOSIS — N926 Irregular menstruation, unspecified: Secondary | ICD-10-CM | POA: Insufficient documentation

## 2018-05-23 DIAGNOSIS — F411 Generalized anxiety disorder: Secondary | ICD-10-CM | POA: Diagnosis not present

## 2018-05-23 DIAGNOSIS — B009 Herpesviral infection, unspecified: Secondary | ICD-10-CM | POA: Diagnosis not present

## 2018-05-23 DIAGNOSIS — Z0000 Encounter for general adult medical examination without abnormal findings: Secondary | ICD-10-CM | POA: Diagnosis not present

## 2018-05-23 MED ORDER — DILTIAZEM HCL 30 MG PO TABS: 30.0000 mg | ORAL_TABLET | Freq: Four times a day (QID) | ORAL | 0 refills | Status: DC | PRN

## 2018-05-23 MED ORDER — METOPROLOL SUCCINATE ER 25 MG PO TB24: 25.0000 mg | ORAL_TABLET | Freq: Every day | ORAL | 3 refills | Status: DC

## 2018-05-23 MED ORDER — VALACYCLOVIR HCL 1 G PO TABS: 1000.0000 mg | ORAL_TABLET | Freq: Two times a day (BID) | ORAL | 2 refills | Status: DC

## 2018-05-23 MED ORDER — JUNEL FE 1/20 1-20 MG-MCG PO TABS: 1.0000 | ORAL_TABLET | Freq: Every day | ORAL | 1 refills | Status: DC

## 2018-05-23 MED ORDER — BUSPIRONE HCL 10 MG PO TABS: 10.0000 mg | ORAL_TABLET | Freq: Three times a day (TID) | ORAL | 1 refills | Status: DC

## 2018-05-23 NOTE — Assessment & Plan Note (Signed)
Clinical breast exam and Pap smear performed.  Tdap given

## 2018-05-23 NOTE — Patient Instructions (Signed)
Let me know if cycles continue to be irregular- if persists, I would like to consult GYN.   Trial increase of buspar to 75m three times a day. Let me know if helps. If not please make a follow up appointment  Health Maintenance, Female Adopting a healthy lifestyle and getting preventive care can go a long way to promote health and wellness. Talk with your health care provider about what schedule of regular examinations is right for you. This is a good chance for you to check in with your provider about disease prevention and staying healthy. In between checkups, there are plenty of things you can do on your own. Experts have done a lot of research about which lifestyle changes and preventive measures are most likely to keep you healthy. Ask your health care provider for more information. Weight and diet Eat a healthy diet  Be sure to include plenty of vegetables, fruits, low-fat dairy products, and lean protein.  Do not eat a lot of foods high in solid fats, added sugars, or salt.  Get regular exercise. This is one of the most important things you can do for your health. ? Most adults should exercise for at least 150 minutes each week. The exercise should increase your heart rate and make you sweat (moderate-intensity exercise). ? Most adults should also do strengthening exercises at least twice a week. This is in addition to the moderate-intensity exercise.  Maintain a healthy weight  Body mass index (BMI) is a measurement that can be used to identify possible weight problems. It estimates body fat based on height and weight. Your health care provider can help determine your BMI and help you achieve or maintain a healthy weight.  For females 261years of age and older: ? A BMI below 18.5 is considered underweight. ? A BMI of 18.5 to 24.9 is normal. ? A BMI of 25 to 29.9 is considered overweight. ? A BMI of 30 and above is considered obese.  Watch levels of cholesterol and blood  lipids  You should start having your blood tested for lipids and cholesterol at 46years of age, then have this test every 5 years.  You may need to have your cholesterol levels checked more often if: ? Your lipid or cholesterol levels are high. ? You are older than 46years of age. ? You are at high risk for heart disease.  Cancer screening Lung Cancer  Lung cancer screening is recommended for adults 534845years old who are at high risk for lung cancer because of a history of smoking.  A yearly low-dose CT scan of the lungs is recommended for people who: ? Currently smoke. ? Have quit within the past 15 years. ? Have at least a 30-pack-year history of smoking. A pack year is smoking an average of one pack of cigarettes a day for 1 year.  Yearly screening should continue until it has been 15 years since you quit.  Yearly screening should stop if you develop a health problem that would prevent you from having lung cancer treatment.  Breast Cancer  Practice breast self-awareness. This means understanding how your breasts normally appear and feel.  It also means doing regular breast self-exams. Let your health care provider know about any changes, no matter how small.  If you are in your 20s or 30s, you should have a clinical breast exam (CBE) by a health care provider every 1-3 years as part of a regular health exam.  If you are  12 or older, have a CBE every year. Also consider having a breast X-ray (mammogram) every year.  If you have a family history of breast cancer, talk to your health care provider about genetic screening.  If you are at high risk for breast cancer, talk to your health care provider about having an MRI and a mammogram every year.  Breast cancer gene (BRCA) assessment is recommended for women who have family members with BRCA-related cancers. BRCA-related cancers include: ? Breast. ? Ovarian. ? Tubal. ? Peritoneal cancers.  Results of the assessment will  determine the need for genetic counseling and BRCA1 and BRCA2 testing.  Cervical Cancer Your health care provider may recommend that you be screened regularly for cancer of the pelvic organs (ovaries, uterus, and vagina). This screening involves a pelvic examination, including checking for microscopic changes to the surface of your cervix (Pap test). You may be encouraged to have this screening done every 3 years, beginning at age 85.  For women ages 47-65, health care providers may recommend pelvic exams and Pap testing every 3 years, or they may recommend the Pap and pelvic exam, combined with testing for human papilloma virus (HPV), every 5 years. Some types of HPV increase your risk of cervical cancer. Testing for HPV may also be done on women of any age with unclear Pap test results.  Other health care providers may not recommend any screening for nonpregnant women who are considered low risk for pelvic cancer and who do not have symptoms. Ask your health care provider if a screening pelvic exam is right for you.  If you have had past treatment for cervical cancer or a condition that could lead to cancer, you need Pap tests and screening for cancer for at least 20 years after your treatment. If Pap tests have been discontinued, your risk factors (such as having a new sexual partner) need to be reassessed to determine if screening should resume. Some women have medical problems that increase the chance of getting cervical cancer. In these cases, your health care provider may recommend more frequent screening and Pap tests.  Colorectal Cancer  This type of cancer can be detected and often prevented.  Routine colorectal cancer screening usually begins at 46 years of age and continues through 46 years of age.  Your health care provider may recommend screening at an earlier age if you have risk factors for colon cancer.  Your health care provider may also recommend using home test kits to check  for hidden blood in the stool.  A small camera at the end of a tube can be used to examine your colon directly (sigmoidoscopy or colonoscopy). This is done to check for the earliest forms of colorectal cancer.  Routine screening usually begins at age 67.  Direct examination of the colon should be repeated every 5-10 years through 46 years of age. However, you may need to be screened more often if early forms of precancerous polyps or small growths are found.  Skin Cancer  Check your skin from head to toe regularly.  Tell your health care provider about any new moles or changes in moles, especially if there is a change in a mole's shape or color.  Also tell your health care provider if you have a mole that is larger than the size of a pencil eraser.  Always use sunscreen. Apply sunscreen liberally and repeatedly throughout the day.  Protect yourself by wearing long sleeves, pants, a wide-brimmed hat, and sunglasses whenever you  are outside.  Heart disease, diabetes, and high blood pressure  High blood pressure causes heart disease and increases the risk of stroke. High blood pressure is more likely to develop in: ? People who have blood pressure in the high end of the normal range (130-139/85-89 mm Hg). ? People who are overweight or obese. ? People who are African American.  If you are 77-66 years of age, have your blood pressure checked every 3-5 years. If you are 46 years of age or older, have your blood pressure checked every year. You should have your blood pressure measured twice-once when you are at a hospital or clinic, and once when you are not at a hospital or clinic. Record the average of the two measurements. To check your blood pressure when you are not at a hospital or clinic, you can use: ? An automated blood pressure machine at a pharmacy. ? A home blood pressure monitor.  If you are between 37 years and 59 years old, ask your health care provider if you should take  aspirin to prevent strokes.  Have regular diabetes screenings. This involves taking a blood sample to check your fasting blood sugar level. ? If you are at a normal weight and have a low risk for diabetes, have this test once every three years after 46 years of age. ? If you are overweight and have a high risk for diabetes, consider being tested at a younger age or more often. Preventing infection Hepatitis B  If you have a higher risk for hepatitis B, you should be screened for this virus. You are considered at high risk for hepatitis B if: ? You were born in a country where hepatitis B is common. Ask your health care provider which countries are considered high risk. ? Your parents were born in a high-risk country, and you have not been immunized against hepatitis B (hepatitis B vaccine). ? You have HIV or AIDS. ? You use needles to inject street drugs. ? You live with someone who has hepatitis B. ? You have had sex with someone who has hepatitis B. ? You get hemodialysis treatment. ? You take certain medicines for conditions, including cancer, organ transplantation, and autoimmune conditions.  Hepatitis C  Blood testing is recommended for: ? Everyone born from [birth year] through 1964. ? Anyone with known risk factors for hepatitis C.  Sexually transmitted infections (STIs)  You should be screened for sexually transmitted infections (STIs) including gonorrhea and chlamydia if: ? You are sexually active and are younger than 46 years of age. ? You are older than 46 years of age and your health care provider tells you that you are at risk for this type of infection. ? Your sexual activity has changed since you were last screened and you are at an increased risk for chlamydia or gonorrhea. Ask your health care provider if you are at risk.  If you do not have HIV, but are at risk, it may be recommended that you take a prescription medicine daily to prevent HIV infection. This is called  pre-exposure prophylaxis (PrEP). You are considered at risk if: ? You are sexually active and do not regularly use condoms or know the HIV status of your partner(s). ? You take drugs by injection. ? You are sexually active with a partner who has HIV.  Talk with your health care provider about whether you are at high risk of being infected with HIV. If you choose to begin PrEP, you should first be  tested for HIV. You should then be tested every 3 months for as long as you are taking PrEP. Pregnancy  If you are premenopausal and you may become pregnant, ask your health care provider about preconception counseling.  If you may become pregnant, take 400 to 800 micrograms (mcg) of folic acid every day.  If you want to prevent pregnancy, talk to your health care provider about birth control (contraception). Osteoporosis and menopause  Osteoporosis is a disease in which the bones lose minerals and strength with aging. This can result in serious bone fractures. Your risk for osteoporosis can be identified using a bone density scan.  If you are 65 years of age or older, or if you are at risk for osteoporosis and fractures, ask your health care provider if you should be screened.  Ask your health care provider whether you should take a calcium or vitamin D supplement to lower your risk for osteoporosis.  Menopause may have certain physical symptoms and risks.  Hormone replacement therapy may reduce some of these symptoms and risks. Talk to your health care provider about whether hormone replacement therapy is right for you. Follow these instructions at home:  Schedule regular health, dental, and eye exams.  Stay current with your immunizations.  Do not use any tobacco products including cigarettes, chewing tobacco, or electronic cigarettes.  If you are pregnant, do not drink alcohol.  If you are breastfeeding, limit how much and how often you drink alcohol.  Limit alcohol intake to no more  than 1 drink per day for nonpregnant women. One drink equals 12 ounces of beer, 5 ounces of wine, or 1 ounces of hard liquor.  Do not use street drugs.  Do not share needles.  Ask your health care provider for help if you need support or information about quitting drugs.  Tell your health care provider if you often feel depressed.  Tell your health care provider if you have ever been abused or do not feel safe at home. This information is not intended to replace advice given to you by your health care provider. Make sure you discuss any questions you have with your health care provider. Document Released: 06/01/2011 Document Revised: 04/23/2016 Document Reviewed: 08/20/2015 Elsevier Interactive Patient Education  2018 [employer]vier Inc.  

## 2018-05-23 NOTE — Addendum Note (Signed)
Addended by: Johna Sheriff on: 05/23/2018 10:38 AM   Modules accepted: Orders

## 2018-05-23 NOTE — Telephone Encounter (Signed)
Patient ask to send to different pharmacy

## 2018-05-23 NOTE — Assessment & Plan Note (Signed)
Etiology unclear at this time.  Patient appears to be a little young for menopause.  History of abnormal Pap smears in her 5s.  Pending Pap smear.  Duration of 3 months, patient jointly agreed that if labs were normal and menstrual irregularities persisted, patient let me know and we will consult GYN.

## 2018-05-23 NOTE — Assessment & Plan Note (Signed)
Worsened of late.  Discussed trial of increased BuSpar and also counseling.  Patient let me know if no improvement.

## 2018-05-23 NOTE — Progress Notes (Signed)
Subjective:    Patient ID: Betty Rodriguez, female    DOB: 1971/08/01, 47 y.o.   MRN: [id number]  CC: Betty Rodriguez is a 47 y.o. female who presents today for physical exam.    HPI: SVT- NO recent palpitations, last 09/2017 in which saw cardiology. Doing well On metoprolol succinate XR.  Hasnt used cardizem in several months, this is prn. Would like refills. NO CP, SOB.  GAD- on buspar and notes anxiety still. Exercise, meditation with some relief.  Had been on zoloft in the past. Work is stressful. No depression. Has done marriage No si/hi.   Describes spotting with menses for past 3 months, periods are light. May last 2 days or up to 7. No clots. No hot flashes.  Dr Rockey Situ 09/2017- advised to stay on metoprolol succinate 25 mg QD. Considered EP if continued episodesd.   Colorectal Cancer Screening: No early family history Breast Cancer Screening: Mammogram UTD Cervical Cancer Screening: UTD. Notes menses are irregular. H/o abnormal pap smear in 20's.  Bone Health screening/DEXA for 65+: No increased fracture risk. Defer screening at this time Lung Cancer Screening: Doesn't have 30 year pack year history and age > 52 years       Tetanus - due        Labs: Screening labs today. Exercise: Gets regular exercise.  Alcohol use: rare Smoking/tobacco use: Nonsmoker.  Regular dental exams: UTD Wears seat belt: Yes. Skin: follows with [facility]; no h/o skin cancer  HISTORY:  Past Medical History:  Diagnosis Date  . Allergy   . Anxiety   . Chicken pox   . Kidney stones   . SVT (supraventricular tachycardia) ([place])    a. diagnosed 03/2017 in setting of hypokalemia    Past Surgical History:  Procedure Laterality Date  . BREAST BIOPSY Right 07/06/2016   us/ core/neg  . TONSILLECTOMY  1983   Family History  Problem Relation Age of Onset  . Hyperlipidemia Father   . Hypertension Father   . Diabetes Maternal Grandmother   . Hypertension Maternal Grandmother     . Heart disease Maternal Grandmother   . Stroke Maternal Grandmother 64  . Diabetes Maternal Grandfather   . Hypertension Maternal Grandfather   . Heart disease Maternal Grandfather       ALLERGIES: Sulfa antibiotics and Zoloft [sertraline]  Current Outpatient Medications on File Prior to Visit  Medication Sig Dispense Refill  . amLODipine (NORVASC) 5 MG tablet Take 1 tablet (5 mg total) daily by mouth. 30 tablet 3   No current facility-administered medications on file prior to visit.     Social History   Tobacco Use  . Smoking status: Never Smoker  . Smokeless tobacco: Never Used  Substance Use Topics  . Alcohol use: Yes    Alcohol/week: 0.0 oz    Comment: Rare   . Drug use: No    Review of Systems  Constitutional: Negative for chills, fever and unexpected weight change.  HENT: Negative for congestion.   Respiratory: Negative for cough.   Cardiovascular: Negative for chest pain, palpitations (resolved at this time) and leg swelling.  Gastrointestinal: Negative for nausea and vomiting.  Genitourinary: Positive for menstrual problem. Negative for dyspareunia, vaginal bleeding and vaginal pain.  Musculoskeletal: Negative for arthralgias and myalgias.  Skin: Negative for rash.  Neurological: Negative for headaches.  Hematological: Negative for adenopathy.  Psychiatric/Behavioral: Negative for confusion and suicidal ideas. The patient is nervous/anxious.       Objective:  BP 102/68 (BP Location: Left Arm, Patient Position: Sitting, Cuff Size: Normal)   Pulse 75   Temp 98.8 F (37.1 C) (Oral)   Wt 123 lb (55.8 kg)   SpO2 100%   BMI 21.79 kg/m   BP Readings from Last 3 Encounters:  05/23/18 102/68  10/08/17 (!) 180/90  07/13/17 120/70   Wt Readings from Last 3 Encounters:  05/23/18 123 lb (55.8 kg)  10/08/17 114 lb 8 oz (51.9 kg)  07/13/17 114 lb 8 oz (51.9 kg)    Physical Exam  Constitutional: She appears well-developed and well-nourished.  Eyes:  Conjunctivae are normal.  Neck: No thyroid mass and no thyromegaly present.  Cardiovascular: Normal rate, regular rhythm, normal heart sounds and normal pulses.  Pulmonary/Chest: Effort normal and breath sounds normal. She has no wheezes. She has no rhonchi. She has no rales. Right breast exhibits no inverted nipple, no mass, no nipple discharge, no skin change and no tenderness. Left breast exhibits no inverted nipple, no mass, no nipple discharge, no skin change and no tenderness. Breasts are symmetrical.  No masses or asymmetry appreciated during CBE.  Genitourinary: Uterus is not enlarged, not fixed and not tender. Cervix exhibits no motion tenderness, no discharge and no friability. Right adnexum displays no mass, no tenderness and no fullness. Left adnexum displays no mass, no tenderness and no fullness.  Genitourinary Comments: Pap performed. No CMT. Unable to appreciated ovaries.  Lymphadenopathy:       Head (right side): No submental, no submandibular, no tonsillar, no preauricular, no posterior auricular and no occipital adenopathy present.       Head (left side): No submental, no submandibular, no tonsillar, no preauricular, no posterior auricular and no occipital adenopathy present.       Right cervical: No superficial cervical, no deep cervical and no posterior cervical adenopathy present.      Left cervical: No superficial cervical, no deep cervical and no posterior cervical adenopathy present.    She has no axillary adenopathy.       Right axillary: No pectoral and no lateral adenopathy present.       Left axillary: No pectoral and no lateral adenopathy present. Neurological: She is alert.  Skin: Skin is warm and dry.  Psychiatric: She has a normal mood and affect. Her speech is normal and behavior is normal. Thought content normal.  Vitals reviewed.      Assessment & Plan:   Problem List Items Addressed This Visit      Cardiovascular and Mediastinum   SVT (supraventricular  tachycardia) ([employer])    Well-controlled at this time.  Refill medications as requested.  Advised continue follow-up with cardiology if symptoms recur.       Relevant Medications   metoprolol succinate (TOPROL-XL) 25 MG 24 hr tablet   diltiazem (CARDIZEM) 30 MG tablet     Other   Routine physical examination - Primary    Clinical breast exam and Pap smear performed.  Tdap given      Relevant Orders   CBC with Differential/Platelet   Comprehensive metabolic panel   Hemoglobin A1c   Lipid panel   TSH   VITAMIN D 25 Hydroxy (Vit-D Deficiency, Fractures)   Cytology - PAP   HSV-1 (herpes simplex virus 1) infection   Relevant Medications   valACYclovir (VALTREX) 1000 MG tablet   Generalized anxiety disorder    Worsened of late.  Discussed trial of increased BuSpar and also counseling.  Patient let me know if no improvement.  Relevant Medications   busPIRone (BUSPAR) 10 MG tablet   Other Relevant Orders   Ambulatory referral to Psychology   Irregular menstrual cycle    Etiology unclear at this time.  Patient appears to be a little young for menopause.  History of abnormal Pap smears in her 74s.  Pending Pap smear.  Duration of 3 months, patient jointly agreed that if labs were normal and menstrual irregularities persisted, patient let me know and we will consult GYN.        Relevant Medications   JUNEL FE 1/20 1-20 MG-MCG tablet       I have changed Betty Rodriguez busPIRone and JUNEL FE 1/20. I am also having her maintain her amLODipine, valACYclovir, metoprolol succinate, and diltiazem.   Meds ordered this encounter  Medications  . busPIRone (BUSPAR) 10 MG tablet    Sig: Take 1 tablet (10 mg total) by mouth 3 (three) times daily.    Dispense:  180 tablet    Refill:  1    Order Specific Question:   Supervising Provider    Answer:   Deborra Medina L [[id number]]  . valACYclovir (VALTREX) 1000 MG tablet    Sig: Take 1 tablet (1,000 mg total) by mouth 2 (two) times daily.  for 3 days as needed    Dispense:  20 tablet    Refill:  2    Order Specific Question:   Supervising Provider    Answer:   Deborra Medina L [[id number]]  . JUNEL FE 1/20 1-20 MG-MCG tablet    Sig: Take 1 tablet by mouth daily.    Dispense:  28 tablet    Refill:  1    Order Specific Question:   Supervising Provider    Answer:   Derrel Nip, TERESA L [[id number]]  . metoprolol succinate (TOPROL-XL) 25 MG 24 hr tablet    Sig: Take 1 tablet (25 mg total) by mouth daily. Take with or immediately following a meal.    Dispense:  90 tablet    Refill:  3    Order Specific Question:   Supervising Provider    Answer:   Deborra Medina L [[id number]]  . diltiazem (CARDIZEM) 30 MG tablet    Sig: Take 1 tablet (30 mg total) by mouth every 6 (six) hours as needed (palpitations).    Dispense:  20 tablet    Refill:  0    Order Specific Question:   Supervising Provider    Answer:   Crecencio Mc [[id number]]    Return precautions given.   Risks, benefits, and alternatives of the medications and treatment plan prescribed today were discussed, and patient expressed understanding.   Education regarding symptom management and diagnosis given to patient on AVS.   Continue to follow with Burnard Hawthorne, FNP for routine health maintenance.   [facility] and I agreed with plan.   Mable Paris, FNP

## 2018-05-23 NOTE — Assessment & Plan Note (Signed)
Well-controlled at this time.  Refill medications as requested.  Advised continue follow-up with cardiology if symptoms recur.

## 2018-05-24 LAB — CYTOLOGY - PAP
Diagnosis: NEGATIVE
HPV: NOT DETECTED

## 2018-05-26 ENCOUNTER — Encounter: Payer: Self-pay | Admitting: Family

## 2018-05-26 DIAGNOSIS — B009 Herpesviral infection, unspecified: Secondary | ICD-10-CM

## 2018-05-26 DIAGNOSIS — I471 Supraventricular tachycardia: Secondary | ICD-10-CM

## 2018-05-26 DIAGNOSIS — F411 Generalized anxiety disorder: Secondary | ICD-10-CM

## 2018-05-26 DIAGNOSIS — N926 Irregular menstruation, unspecified: Secondary | ICD-10-CM

## 2018-06-01 ENCOUNTER — Telehealth: Payer: Self-pay | Admitting: *Deleted

## 2018-06-01 ENCOUNTER — Encounter: Payer: Self-pay | Admitting: Family

## 2018-06-01 NOTE — Telephone Encounter (Signed)
Copied from [place] [id number]. Topic: Referral - Status >> Jun 01, 2018  4:28 PM Bea Graff, NT wrote: Reason for CRM: Pt states she was seen a few weeks ago and a referral for counseling was to be placed but she hasn't heard anything.

## 2018-06-03 MED ORDER — METOPROLOL SUCCINATE ER 25 MG PO TB24: 25.0000 mg | ORAL_TABLET | Freq: Every day | ORAL | 1 refills | Status: DC

## 2018-06-03 MED ORDER — VALACYCLOVIR HCL 1 G PO TABS: 1000.0000 mg | ORAL_TABLET | Freq: Two times a day (BID) | ORAL | 2 refills | Status: DC

## 2018-06-03 MED ORDER — JUNEL FE 1/20 1-20 MG-MCG PO TABS: 1.0000 | ORAL_TABLET | Freq: Every day | ORAL | 1 refills | Status: DC

## 2018-06-03 MED ORDER — BUSPIRONE HCL 10 MG PO TABS: 10.0000 mg | ORAL_TABLET | Freq: Three times a day (TID) | ORAL | 1 refills | Status: DC

## 2018-06-03 MED ORDER — DILTIAZEM HCL 30 MG PO TABS: 30.0000 mg | ORAL_TABLET | Freq: Four times a day (QID) | ORAL | 0 refills | Status: DC | PRN

## 2018-06-22 ENCOUNTER — Encounter

## 2018-06-22 ENCOUNTER — Ambulatory Visit: Payer: 59 | Admitting: Psychology

## 2018-06-22 DIAGNOSIS — F411 Generalized anxiety disorder: Secondary | ICD-10-CM

## 2018-07-06 ENCOUNTER — Ambulatory Visit: Payer: 59 | Admitting: Psychology

## 2018-07-06 DIAGNOSIS — F411 Generalized anxiety disorder: Secondary | ICD-10-CM

## 2018-07-08 ENCOUNTER — Ambulatory Visit (INDEPENDENT_AMBULATORY_CARE_PROVIDER_SITE_OTHER): Payer: Managed Care, Other (non HMO) | Admitting: Family

## 2018-07-08 ENCOUNTER — Encounter: Payer: Self-pay | Admitting: Family

## 2018-07-08 VITALS — BP 122/70 | HR 81 | Temp 98.3°F | Ht 63.0 in | Wt 124.5 lb

## 2018-07-08 DIAGNOSIS — N926 Irregular menstruation, unspecified: Secondary | ICD-10-CM

## 2018-07-08 DIAGNOSIS — Z9229 Personal history of other drug therapy: Secondary | ICD-10-CM | POA: Diagnosis not present

## 2018-07-08 NOTE — Assessment & Plan Note (Addendum)
Based on age, 47 years old, reasonable to suspect patient is perimenopausal.  However, based on of standard care, if patient is less than 75 years old, screening with TSH, prolactin, hCG appropriate.  Discussed patient at great length, we still should consider consult with GYN.  I did not make this referral today, pending labs.  Patient will stay vigilant and follow with me in 3 months.

## 2018-07-08 NOTE — Assessment & Plan Note (Signed)
Pending titers

## 2018-07-08 NOTE — Progress Notes (Signed)
Subjective:    Patient ID: [name], female    DOB: Apr 14, 1971, 47 y.o.   MRN: [id number]  CC: Irisa Grimsley is a 47 y.o. female who presents today for follow up.   HPI: Feels well today. No complaints.   Wellness form for work and need for MMR titers drawn.   Has had one menses since CPE. Thinks might be having hot flashes, or partner has mentioned this. Sheets are not drenched. 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 Menses are short in duration. No pelvic pain, clots seen.        HISTORY:  Past Medical History:  Diagnosis Date  . Allergy   . Anxiety   . Chicken pox   . Kidney stones   . SVT (supraventricular tachycardia) ([place])    a. diagnosed 03/2017 in setting of hypokalemia   Past Surgical History:  Procedure Laterality Date  . BREAST BIOPSY Right 07/06/2016   us/ core/neg  . TONSILLECTOMY  1983   Family History  Problem Relation Age of Onset  . Hyperlipidemia Father   . Hypertension Father   . Diabetes Maternal Grandmother   . Hypertension Maternal Grandmother   . Heart disease Maternal Grandmother   . Stroke Maternal Grandmother 95  . Diabetes Maternal Grandfather   . Hypertension Maternal Grandfather   . Heart disease Maternal Grandfather     Allergies: Sulfa antibiotics and Zoloft [sertraline] Current Outpatient Medications on File Prior to Visit  Medication Sig Dispense Refill  . busPIRone (BUSPAR) 10 MG tablet Take 1 tablet (10 mg total) by mouth 3 (three) times daily. 180 tablet 1  . diltiazem (CARDIZEM) 30 MG tablet Take 1 tablet (30 mg total) by mouth every 6 (six) hours as needed (palpitations). 20 tablet 0  . JUNEL FE 1/20 1-20 MG-MCG tablet Take 1 tablet by mouth daily. 28 tablet 1  . metoprolol succinate (TOPROL-XL) 25 MG 24 hr tablet Take 1 tablet (25 mg total) by mouth daily. Take with or immediately following a meal. 90 tablet 1  . valACYclovir (VALTREX) 1000 MG tablet Take 1  tablet (1,000 mg total) by mouth 2 (two) times daily. for 3 days as needed 20 tablet 2   No current facility-administered medications on file prior to visit.     Social History   Tobacco Use  . Smoking status: Never Smoker  . Smokeless tobacco: Never Used  Substance Use Topics  . Alcohol use: Yes    Alcohol/week: 0.0 standard drinks    Comment: Rare   . Drug use: No    Review of Systems  Constitutional: Negative for chills and fever.  Respiratory: Negative for cough.   Cardiovascular: Negative for chest pain and palpitations.  Gastrointestinal: Negative for nausea and vomiting.  Genitourinary: Positive for vaginal bleeding. Negative for dyspareunia and vaginal pain.      Objective:    BP 122/70 (BP Location: Left Arm, Patient Position: Sitting, Cuff Size: Normal)   Pulse 81   Temp 98.3 F (36.8 C) (Oral)   Ht _0  (1.6 m)   Wt 124 lb 8 oz (56.5 kg)   SpO2 99%   BMI 22.05 kg/m  BP Readings from Last 3 Encounters:  07/08/18 122/70  05/23/18 102/68  10/08/17 (!) 180/90   Wt Readings from Last 3 Encounters:  07/08/18 124 lb 8 oz (56.5 kg)  05/23/18 123 lb (55.8 kg)  10/08/17 114 lb 8 oz (51.9 kg)  Physical Exam  Constitutional: She appears well-developed and well-nourished.  Eyes: Conjunctivae are normal.  Cardiovascular: Normal rate, regular rhythm, normal heart sounds and normal pulses.  Pulmonary/Chest: Effort normal and breath sounds normal. She has no wheezes. She has no rhonchi. She has no rales.  Neurological: She is alert.  Skin: Skin is warm and dry.  Psychiatric: She has a normal mood and affect. Her speech is normal and behavior is normal. Thought content normal.  Vitals reviewed.      Assessment & Plan:   Problem List Items Addressed This Visit      Other   Irregular menstrual cycle    Based on age, 47 years old, reasonable to suspect patient is perimenopausal.  However, based on of standard care, if patient is less than 62 years old,  screening with TSH, prolactin, hCG appropriate.  Discussed patient at great length, we still should consider consult with GYN.  I did not make this referral today, pending labs.  Patient will stay vigilant and follow with me in 3 months.      History of MMR vaccination - Primary    Pending titers      Relevant Orders   Measles/Mumps/Rubella Immunity   RESOLVED: Abnormal menses   Relevant Orders   hCG, serum, qualitative   Prolactin       I have discontinued Lekia Nier. Carrozza's amLODipine. I am also having her maintain her JUNEL FE 1/20, busPIRone, metoprolol succinate, diltiazem, and valACYclovir.   No orders of the defined types were placed in this encounter.   Return precautions given.   Risks, benefits, and alternatives of the medications and treatment plan prescribed today were discussed, and patient expressed understanding.   Education regarding symptom management and diagnosis given to patient on AVS.  Continue to follow with Burnard Hawthorne, FNP for routine health maintenance.   [facility] and I agreed with plan.   Mable Paris, FNP

## 2018-07-08 NOTE — Patient Instructions (Addendum)
Continue to watch menses. Pending labs regarding this.

## 2018-07-12 LAB — COMPREHENSIVE METABOLIC PANEL
ALT: 13 IU/L (ref 0–32)
AST: 20 IU/L (ref 0–40)
Albumin/Globulin Ratio: 1.7 (ref 1.2–2.2)
Albumin: 4.2 g/dL (ref 3.5–5.5)
Alkaline Phosphatase: 35 IU/L — ABNORMAL LOW (ref 39–117)
BUN/Creatinine Ratio: 15 (ref 9–23)
BUN: 13 mg/dL (ref 6–24)
Bilirubin Total: 0.4 mg/dL (ref 0.0–1.2)
CO2: 19 mmol/L — ABNORMAL LOW (ref 20–29)
Calcium: 9.2 mg/dL (ref 8.7–10.2)
Chloride: 104 mmol/L (ref 96–106)
Creatinine, Ser: 0.89 mg/dL (ref 0.57–1.00)
GFR calc Af Amer: 90 mL/min/{1.73_m2} (ref >59)
GFR calc non Af Amer: 78 mL/min/{1.73_m2} (ref >59)
Globulin, Total: 2.5 g/dL (ref 1.5–4.5)
Glucose: 94 mg/dL (ref 65–99)
Potassium: 4.6 mmol/L (ref 3.5–5.2)
Sodium: 139 mmol/L (ref 134–144)
Total Protein: 6.7 g/dL (ref 6.0–8.5)

## 2018-07-12 LAB — LIPID PANEL
Chol/HDL Ratio: 2.8 ratio (ref 0.0–4.4)
Cholesterol, Total: 179 mg/dL (ref 100–199)
HDL: 63 mg/dL (ref >39)
LDL Calculated: 98 mg/dL (ref 0–99)
Triglycerides: 91 mg/dL (ref 0–149)
VLDL Cholesterol Cal: 18 mg/dL (ref 5–40)

## 2018-07-12 LAB — TSH: TSH: 1.14 u[IU]/mL (ref 0.450–4.500)

## 2018-07-12 LAB — CBC WITH DIFFERENTIAL/PLATELET
Basophils Absolute: 0 10*3/uL (ref 0.0–0.2)
Basos: 0 %
EOS (ABSOLUTE): 0.1 10*3/uL (ref 0.0–0.4)
Eos: 1 %
Hematocrit: 37.9 % (ref 34.0–46.6)
Hemoglobin: 12.6 g/dL (ref 11.1–15.9)
Immature Grans (Abs): 0 10*3/uL (ref 0.0–0.1)
Immature Granulocytes: 0 %
Lymphocytes Absolute: 2 10*3/uL (ref 0.7–3.1)
Lymphs: 23 %
MCH: 29.7 pg (ref 26.6–33.0)
MCHC: 33.2 g/dL (ref 31.5–35.7)
MCV: 89 fL (ref 79–97)
Monocytes Absolute: 0.4 10*3/uL (ref 0.1–0.9)
Monocytes: 4 %
Neutrophils Absolute: 6.1 10*3/uL (ref 1.4–7.0)
Neutrophils: 72 %
Platelets: 310 10*3/uL (ref 150–450)
RBC: 4.24 x10E6/uL (ref 3.77–5.28)
RDW: 13.5 % (ref 12.3–15.4)
WBC: 8.6 10*3/uL (ref 3.4–10.8)

## 2018-07-12 LAB — HCG, SERUM, QUALITATIVE: hCG,Beta Subunit,Qual,Serum: NEGATIVE m[IU]/mL (ref <6)

## 2018-07-12 LAB — MEASLES/MUMPS/RUBELLA IMMUNITY
MUMPS ABS, IGG: 96.6 AU/mL (ref >10.9)
RUBEOLA AB, IGG: >300 AU/mL (ref >29.9)
Rubella Antibodies, IGG: 2.38 index (ref >0.99)

## 2018-07-12 LAB — PROLACTIN: Prolactin: 15.6 ng/mL (ref 4.8–23.3)

## 2018-07-12 LAB — VITAMIN D 25 HYDROXY (VIT D DEFICIENCY, FRACTURES): Vit D, 25-Hydroxy: 28.5 ng/mL — ABNORMAL LOW (ref 30.0–100.0)

## 2018-07-12 LAB — HEMOGLOBIN A1C
Est. average glucose Bld gHb Est-mCnc: 114 mg/dL
Hgb A1c MFr Bld: 5.6 % (ref 4.8–5.6)

## 2018-07-17 NOTE — Progress Notes (Signed)
Cardiology Office Note  Date:  07/18/2018   ID:  Betty Rodriguez, DOB 03-21-1971, MRN [id number]  PCP:  Burnard Hawthorne, FNP   Chief Complaint  Patient presents with  . OTHER    5 month f/u c/o elevated HR episode last week . Meds reviewed verbally with pt.    HPI:  Betty Rodriguez is a 47 y.o. female with history of SVT in 03/2017,  HTN,  anxiety  admission to [facility] from 5/23-5/24/18 for allergic reaction (after shot, benadryl) and palpations noted to have SVT Who presents for follow-up of her arrhythmia/SVT   In follow-up today she reports having recent episode of tachycardia Tachycardia presented acutely after lunch last week Measured heart rate 174 bpm on her watch and phone using pulse meter Lasted for 15 minutes, felt very dizzy To occur emergency diltiazem and eventually symptoms resolved without intervention  Prior episode of tachycardia November 2018 acute onset of tachycardia rate 160 bpm,  was watching TV, ate pizza,, 2:30 p.m.  acute tachycardia Rate 165,  Took diltiazem Took metoprolol xl 25 She did not want to go to the emergency room, was very symptomatic  She does report other episodes of tachycardia but not nearly as fast typically 120 bpm 4-5 episodes lasting 10 minutes at a time [place] possibly secondary to atrial tachycardia  She is tolerating metoprolol succinate 25 mg daily  EKG personally reviewed by myself on todays visit Shows normal sinus rhythm rate 97 bpm no significant ST or T-wave changes  Other past medical history reviewed   saw PCP on 5/21 for elevated BP readings as well as anxiety.  She was placed on amlodipine and Zoloft (previously on Buspar years ago without issues). She took her first dose of each without issues on 5/21 and took her 2nd doses on Tuesday, 5/22. This was followed by the development of urticaria, pruritis, and palpitations with a self documented heart rate in the 150s bpm. She presented to urgent care were she reports  having a heart rate in the 150s and was given steroids, Benadryl, and advised to go home and take Pepcid. Upon arrival at home she continued to note palpitations and had a heart rate in the 150s bpm. She presented to [facility] on 5/22    EKG documenting sinus tachycardia in the 110s bpm followed by development of SVT on subsequent EKG into the 180s bpm. She spontaneously converted to sinus rhythm without intervention.    hypokalemic at 3.3 that was repleted to 4.3. TSH normal, urine drug screen negative, magnesium 1.7 that was repleted to 2.2, troponin negative, unremarkable CBC. She was started on metoprolol 25 mg bid given her BP and heart rate, which was titrated to 50 mg bid at time of discharge. Her amlodipine and Zoloft were held given the possible allergic reaction.    Echocardiogram performed on 6/13 showed normal LV systolic function with EF of 55-60%, normal wall motion, normal LV diastolic function parameters.    PMH:   has a past medical history of Allergy, Anxiety, Chicken pox, Kidney stones, and SVT (supraventricular tachycardia) ([place]).  PSH:    Past Surgical History:  Procedure Laterality Date  . BREAST BIOPSY Right 07/06/2016   us/ core/neg  . TONSILLECTOMY  1983    Current Outpatient Medications  Medication Sig Dispense Refill  . busPIRone (BUSPAR) 10 MG tablet Take 1 tablet (10 mg total) by mouth 3 (three) times daily. 180 tablet 1  . diltiazem (CARDIZEM) 30 MG tablet Take 1 tablet (30  mg total) by mouth every 6 (six) hours as needed (palpitations). 20 tablet 0  . JUNEL FE 1/20 1-20 MG-MCG tablet Take 1 tablet by mouth daily. 28 tablet 1  . metoprolol succinate (TOPROL-XL) 25 MG 24 hr tablet Take 1 tablet (25 mg total) by mouth daily. Take with or immediately following a meal. 90 tablet 1  . valACYclovir (VALTREX) 1000 MG tablet Take 1 tablet (1,000 mg total) by mouth 2 (two) times daily. for 3 days as needed 20 tablet 2   No current facility-administered medications for  this visit.      Allergies:   Sulfa antibiotics and Zoloft [sertraline]   Social History:  The patient  reports that she has never smoked. She has never used smokeless tobacco. She reports that she drinks alcohol. She reports that she does not use drugs.   Family History:   family history includes Diabetes in her maternal grandfather and maternal grandmother; Heart disease in her maternal grandfather and maternal grandmother; Hyperlipidemia in her father; Hypertension in her father, maternal grandfather, and maternal grandmother; Stroke (age of onset: 73) in her maternal grandmother.    Review of Systems: Review of Systems  Constitutional: Negative.   Respiratory: Negative.   Cardiovascular: Negative.        Tachycardia  Gastrointestinal: Negative.   Musculoskeletal: Negative.   Neurological: Negative.   Psychiatric/Behavioral: The patient is nervous/anxious.   All other systems reviewed and are negative.    PHYSICAL EXAM: VS:  BP 118/78 (BP Location: Left Arm, Patient Position: Sitting, Cuff Size: Normal)   Pulse 97   Ht 5' 3"  (1.6 m)   Wt 125 lb 12 oz (57 kg)   BMI 22.28 kg/m  , BMI Body mass index is 22.28 kg/m. Constitutional:  oriented to person, place, and time. No distress.  HENT:  Head: Normocephalic and atraumatic.  Eyes:  no discharge. No scleral icterus.  Neck: Normal range of motion. Neck supple. No JVD present.  Cardiovascular: Normal rate, regular rhythm, normal heart sounds and intact distal pulses. Exam reveals no gallop and no friction rub. No edema No murmur heard. Pulmonary/Chest: Effort normal and breath sounds normal. No stridor. No respiratory distress.  no wheezes.  no rales.  no tenderness.  Abdominal: Soft.  no distension.  no tenderness.  Musculoskeletal: Normal range of motion.  no  tenderness or deformity.  Neurological:  normal muscle tone. Coordination normal. No atrophy Skin: Skin is warm and dry. No rash noted. not diaphoretic.   Psychiatric:  normal mood and affect. behavior is normal. Thought content normal.    Recent Labs: 07/11/2018: ALT 13; BUN 13; Creatinine, Ser 0.89; Hemoglobin 12.6; Platelets 310; Potassium 4.6; Sodium 139; TSH 1.140    Lipid Panel Lab Results  Component Value Date   CHOL 179 07/11/2018   HDL 63 07/11/2018   LDLCALC 98 07/11/2018   TRIG 91 07/11/2018      Wt Readings from Last 3 Encounters:  07/18/18 125 lb 12 oz (57 kg)  07/08/18 124 lb 8 oz (56.5 kg)  05/23/18 123 lb (55.8 kg)       ASSESSMENT AND PLAN:  SVT (supraventricular tachycardia) ([place]) - Plan: EKG 12-Lead Second severe episode of tachycardia,  most recently last week Suspected to be SVT most recently She is very anxious about her symptoms and contemplating meeting with EP She will call us to let us know No medication changes made Discussed carotid sinus side she and Valsalva, Discussed calling EMTs for adenosine if needed Very infrequent  episodes at this time, monitor would likely be of little benefit --long discussion concerning for SVT ablation  Hypertension, unspecified type - Plan: EKG 12-Lead Blood pressure is well controlled on today's visit. No changes made to the medications. Continue metoprolol   Atrial tachycardia ([place]) in addition to SVT likely having periodic episodes of atrial tachycardia Perhaps improved with metoprolol  Generalized anxiety disorder Managed by primary care   Total encounter time more than 45 minutes  Greater than 50% was spent in counseling and coordination of care with the patient   Disposition:   F/U  12 months   Orders Placed This Encounter  Procedures  . EKG 12-Lead     Signed, Esmond Plants, M.D., Ph.D. 07/18/2018  [place], [place]

## 2018-07-18 ENCOUNTER — Ambulatory Visit: Payer: Managed Care, Other (non HMO) | Admitting: Cardiovascular Disease

## 2018-07-18 ENCOUNTER — Encounter: Payer: Self-pay | Admitting: Cardiovascular Disease

## 2018-07-18 VITALS — BP 118/78 | HR 97 | Ht 63.0 in | Wt 125.8 lb

## 2018-07-18 DIAGNOSIS — I471 Supraventricular tachycardia: Secondary | ICD-10-CM | POA: Diagnosis not present

## 2018-07-18 DIAGNOSIS — R0602 Shortness of breath: Secondary | ICD-10-CM

## 2018-07-18 DIAGNOSIS — I10 Essential (primary) hypertension: Secondary | ICD-10-CM

## 2018-07-18 DIAGNOSIS — F411 Generalized anxiety disorder: Secondary | ICD-10-CM

## 2018-07-18 MED ORDER — DILTIAZEM HCL 30 MG PO TABS: 30.0000 mg | ORAL_TABLET | Freq: Four times a day (QID) | ORAL | 3 refills | Status: DC | PRN

## 2018-07-18 NOTE — Patient Instructions (Addendum)
Read about carotid sinus massage, Valsalva maneuver  [employer]  Search for app: Instant heart rate "Pulse meter"   Medication Instructions:   No medication changes made  Read about Dr. Lovena Le Dr. Rayann Heman Dr. Sherlynn Carbon:  No new labs needed  Testing/Procedures:  No further testing at this time   Follow-Up: It was a pleasure seeing you in the office today. Please call us if you have new issues that need to be addressed before your next appt.  [phone]  Your physician wants you to follow-up in:  As needed  If you need a refill on your cardiac medications before your next appointment, please call your pharmacy.  For educational health videos Log in to : [email or web address] Or : [email or web address], password : [email or web address]  Supraventricular Tachycardia, Adult Supraventricular tachycardia (SVT) is a kind of abnormal heartbeat. It makes your heart beat very fast and then beat at a normal speed. A normal heart beats 60-100 times a minute. This condition can make your heart beat more than 150 times a minute. Times of having a fast heartbeat (episodes) can be scary, but they are usually not dangerous. They can lead to problems if:  They happen often.  They last a long time.  Symptoms of this condition include:  A pounding heart.  A feeling that your heart is skipping beats (palpitations).  Weakness.  Trouble getting enough air (shortness of breath).  Pain or tightness in your chest.  Feeling like you are going to pass out (light-headedness).  Feeling worried or nervous (anxiety).  Dizziness.  Sweating.  Feeling sick to your stomach (nausea).  Passing out (fainting).  Tiredness.  Sometimes, there are no symptoms. Follow these instructions at home: Stress  Avoid things that make you feel stressed.  Find out what helps you feel less stressed. Try: ? Doing a relaxing activity, like yoga, meditation, or being out in nature. ? Listening to relaxing  music. ? Doing relaxation techniques, like deep breathing. ? Taking steps to be healthy. These include getting lots of sleep, exercising, and eating a balanced diet. ? Talking with a mental health doctor. Sleep  Try to get at least 7 hours of sleep each night. Tobacco and nicotine  Do not use anything that has nicotine or tobacco, such as cigarettes and e-cigarettes. If you need help quitting, ask your doctor. Alcohol  If alcohol gives you a fast heartbeat, do not drink alcohol.  If alcohol does not seem to give you a fast heartbeat, limit your alcohol. For nonpregnant women, this means no more than 1 drink a day. For men, this means no more than 2 drinks a day. "One drink" means one of these: ? 12 oz of beer. ? 5 oz of wine. ? 1 oz of hard liquor. Caffeine  If caffeine gives you a fast heartbeat, do not eat, drink, or use anything with caffeine in it.  If caffeine does not seem to give you a fast heartbeat, limit how much caffeine you eat, drink, or use. Stimulant drugs  Do not use stimulant drugs. These are drugs like cocaine or methamphetamine. If you need help quitting, ask your doctor. General instructions  Stay at a healthy weight.  Exercise regularly. Ask your doctor to suggest some good activities for you. Try one of these options: ? 150 minutes a week of gentle exercise, like walking or yoga. ? 75 minutes a week of exercise that is very active, like running or swimming. ? A combination of gentle exercise and very active exercise.  Do home treatments to slow down your heartbeat as told by your doctor.  Take over-the-counter and prescription medicines only as told by your doctor. Contact a doctor if:  You have a fast heartbeat more often.  Times of having a fast heartbeat last longer than before.  Your home treatments to slow down your heartbeat do not help.  You have new symptoms. Get help right away if:  You have chest pain.  Your symptoms get  worse.  You have trouble breathing.  Your heart beats very fast for more than 20 minutes.  You pass out (faint). These symptoms may be an emergency. Do not wait to see if the symptoms will go away. Get medical help right away. Call your local emergency services (911 in the [place] Do not drive yourself to the hospital. This information is not intended to replace advice given to you by your health care provider. Make sure you discuss any questions you have with your health care provider. Document Released: 11/16/2005 Document Revised: 07/23/2016 Document Reviewed: 07/23/2016 Elsevier Interactive Patient Education  2017 [employer].

## 2018-07-26 ENCOUNTER — Ambulatory Visit: Payer: 59 | Admitting: Psychology

## 2018-07-26 DIAGNOSIS — F411 Generalized anxiety disorder: Secondary | ICD-10-CM

## 2018-08-04 ENCOUNTER — Other Ambulatory Visit: Payer: Self-pay | Admitting: Cardiovascular Disease

## 2018-08-04 DIAGNOSIS — I471 Supraventricular tachycardia: Secondary | ICD-10-CM

## 2018-08-10 ENCOUNTER — Ambulatory Visit: Payer: 59 | Admitting: Psychology

## 2018-08-18 ENCOUNTER — Other Ambulatory Visit: Payer: Self-pay | Admitting: Family

## 2018-08-18 DIAGNOSIS — N926 Irregular menstruation, unspecified: Secondary | ICD-10-CM

## 2018-08-18 NOTE — Telephone Encounter (Signed)
Last OV 07/18/2018   Last refilled 06/03/2018 disp 28 with 1 refill   Sent to PCP to advise   Last PAP 05/23/2018

## 2018-08-19 ENCOUNTER — Encounter: Payer: Self-pay | Admitting: Family

## 2018-09-30 ENCOUNTER — Encounter

## 2018-09-30 ENCOUNTER — Ambulatory Visit: Payer: Managed Care, Other (non HMO) | Admitting: Family Medicine

## 2018-09-30 ENCOUNTER — Encounter: Payer: Self-pay | Admitting: Family Medicine

## 2018-09-30 VITALS — BP 132/78 | HR 68 | Temp 98.4°F | Resp 16 | Ht 63.0 in | Wt 124.0 lb

## 2018-09-30 DIAGNOSIS — Z1239 Encounter for other screening for malignant neoplasm of breast: Secondary | ICD-10-CM

## 2018-09-30 DIAGNOSIS — B009 Herpesviral infection, unspecified: Secondary | ICD-10-CM | POA: Diagnosis not present

## 2018-09-30 DIAGNOSIS — F411 Generalized anxiety disorder: Secondary | ICD-10-CM | POA: Diagnosis not present

## 2018-09-30 DIAGNOSIS — N926 Irregular menstruation, unspecified: Secondary | ICD-10-CM

## 2018-09-30 DIAGNOSIS — Z87442 Personal history of urinary calculi: Secondary | ICD-10-CM | POA: Diagnosis not present

## 2018-09-30 DIAGNOSIS — I10 Essential (primary) hypertension: Secondary | ICD-10-CM

## 2018-09-30 DIAGNOSIS — I471 Supraventricular tachycardia: Secondary | ICD-10-CM

## 2018-09-30 MED ORDER — JUNEL FE 1/20 1-20 MG-MCG PO TABS: 1.0000 | ORAL_TABLET | Freq: Every day | ORAL | 3 refills | Status: DC

## 2018-09-30 NOTE — Assessment & Plan Note (Signed)
Well controlled Continue metoprolol Reviewed recent labs

## 2018-09-30 NOTE — Assessment & Plan Note (Signed)
Doing well on OCPs Discussed 3 months continuous dosing and will try this

## 2018-09-30 NOTE — Patient Instructions (Signed)

## 2018-09-30 NOTE — Assessment & Plan Note (Signed)
Chronic  Uncontrolled Has stopped buspar Continue therapy Discussed synergism between meds and therapy Could try cymbalta - discussed possible side effects Patient will let me know if she decides to start a medication

## 2018-09-30 NOTE — Progress Notes (Signed)
Patient: Betty Rodriguez, Female    DOB: 1971/07/19, 47 y.o.   MRN: [id number] Visit Date: 09/30/2018  Today's Provider: Lavon Paganini, MD   Chief Complaint  Patient presents with  . Establish Care   Subjective:  I, Tiburcio Pea, CMA, am acting as a scribe for Lavon Paganini, MD.    New Patient:  Betty Rodriguez is a 47 y.o. female who presents today to establish care.  She was previously seen at [facility].  She feels well. She reports exercising intermittently. She reports she is sleeping fairly well.  Anxiety: Long-standing issue.  Describes herself as a Research officer, trade [employer].  Previously tried buspar - didn't seem to help so she stopped it.  Not currently taking any medications.  Has never seen a psychiatrist.  She is currently seeing a therapist Rodena Piety).  She has some moments of panic, but feels that she is doing alright.  She tried Zoloft in the past.  It was started at the same time as amlodipine and she had episode of SVT and hives.  Both medications were stopped, because it was unclear if she had an allergic reaction to either medication.  SVT:  Diagnosed in 03/2017 in setting of hypokalemia.  Was taking Zoloft and amlodipine when it occurred and both were stopped.  She is now well controlled with Metoprolol 30m daily.  She also has diltiazem to take prn for episodes of tachycardia/palpitations.  Has h/o negative breast biopsy. Last mammogram 08/09/17. BIRADS1.  Would like order for her next mammogram placed today.  No family history of breast cancer. -----------------------------------------------------------------   Review of Systems  Constitutional: Negative.   HENT: Positive for rhinorrhea, sinus pain and sneezing. Negative for congestion, dental problem, drooling, ear discharge, ear pain, facial swelling, hearing loss, mouth sores, nosebleeds, postnasal drip, sinus pressure, sore throat, tinnitus, trouble swallowing and voice change.   Eyes: Negative.     Respiratory: Negative.   Cardiovascular: Positive for palpitations. Negative for chest pain and leg swelling.  Gastrointestinal: Negative.   Endocrine: Negative.   Genitourinary: Negative.   Musculoskeletal: Negative.   Skin: Negative.   Allergic/Immunologic: Positive for environmental allergies. Negative for food allergies and immunocompromised state.  Neurological: Positive for headaches. Negative for dizziness, tremors, seizures, syncope, facial asymmetry, speech difficulty, weakness, light-headedness and numbness.  Hematological: Negative.   Psychiatric/Behavioral: Negative.     Social History      She  reports that she has never smoked. She has never used smokeless tobacco. She reports that she drinks alcohol. She reports that she does not use drugs.       Social History   Socioeconomic History  . Marital status: Divorced    Spouse name: Not on file  . Number of children: 3  . Years of education: Not on file  . Highest education level: Not on file  Occupational History  . Occupation: MRadiation protection practitioner Social Needs  . Financial resource strain: Not on file  . Food insecurity:    Worry: Not on file    Inability: Not on file  . Transportation needs:    Medical: Not on file    Non-medical: Not on file  Tobacco Use  . Smoking status: Never Smoker  . Smokeless tobacco: Never Used  Substance and Sexual Activity  . Alcohol use: Yes    Alcohol/week: 0.0 standard drinks    Comment: Rare   . Drug use: No  . Sexual activity: Yes    Partners: Male  Birth control/protection: OCP    Comment: 1 partner   Lifestyle  . Physical activity:    Days per week: Not on file    Minutes per session: Not on file  . Stress: Not on file  Relationships  . Social connections:    Talks on phone: Not on file    Gets together: Not on file    Attends religious service: Not on file    Active member of club or organization: Not on file    Attends meetings of clubs or organizations: Not on  file    Relationship status: Not on file  Other Topics Concern  . 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 Allergy   . Anxiety   . Chicken pox   . Kidney stones   . SVT (supraventricular tachycardia) ([place])    a. diagnosed 03/2017 in setting of hypokalemia     Patient Active Problem List   Diagnosis Date Noted  . Irregular menstrual cycle 05/23/2018  . Atrial tachycardia ([place]) 10/08/2017  . SVT (supraventricular tachycardia) ([place]) 04/21/2017  . Hypertension 04/19/2017  . Generalized anxiety disorder 02/21/2016  . 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 History of nephrolithiasis 11/28/2015    Past Surgical History:  Procedure Laterality Date  . BREAST BIOPSY Right 07/06/2016   us/ core/neg  . TONSILLECTOMY  1983    Family History        Family Status  Relation Name Status  . Mother  Alive  . Father  Alive  . Brother  Alive  . MGM  (Not Specified)  . MGF  (Not Specified)  . PGM  (Not Specified)  . Neg Hx  (Not Specified)        Her family history includes Diabetes in her maternal grandfather and maternal grandmother; Heart disease in her maternal grandfather and maternal grandmother; Hyperlipidemia in her father; Hypertension in her father, maternal grandfather, and maternal grandmother; Multiple sclerosis in her paternal grandmother; Stroke (age of onset: 12) in her maternal grandmother. There is no history of Breast cancer, Colon cancer, or Ovarian cancer.      Allergies  Allergen Reactions  . Sulfa Antibiotics Hives  . Zoloft [Sertraline]     SVT     Current Outpatient Medications:  .  diltiazem (CARDIZEM) 30 MG tablet, Take 1 tablet (30 mg total) by mouth every 6 (six) hours as needed (palpitations).,  Disp: 60 tablet, Rfl: 3 .  JUNEL FE 1/20 1-20 MG-MCG tablet, Take 1 tablet by mouth daily. Take 4 packs continuously and then break for 1 week, Disp: 4 Package, Rfl: 3 .  metoprolol succinate (TOPROL-XL) 25 MG 24 hr tablet, TAKE 1 TABLET BY MOUTH  DAILY WITH OR IMMEDIATELY  FOLLOWING A MEAL, Disp: 90 tablet, Rfl: 1 .  valACYclovir (VALTREX) 1000 MG tablet, Take 1 tablet (1,000 mg total) by mouth 2 (two) times daily. for 3 days as needed, Disp: 20 tablet, Rfl: 2   Patient Care Team: Virginia Crews, MD as PCP - General (Family Medicine)      Objective:   Vitals: BP 132/78 (BP Location: Right Arm, Patient Position: Sitting, Cuff Size: Normal)   Pulse 68  Temp 98.4 F (36.9 C) (Oral)   Resp 16   Ht 5' 3"  (1.6 m)   Wt 124 lb (56.2 kg)   LMP 09/15/2018   BMI 21.97 kg/m    Vitals:   09/30/18 0921  BP: 132/78  Pulse: 68  Resp: 16  Temp: 98.4 F (36.9 C)  TempSrc: Oral  Weight: 124 lb (56.2 kg)  Height: 5' 3"  (1.6 m)     Physical Exam  Constitutional: She is oriented to person, place, and time. She appears well-developed and well-nourished. No distress.  HENT:  Head: Normocephalic and atraumatic.  Right Ear: External ear normal.  Left Ear: External ear normal.  Nose: Nose normal.  Mouth/Throat: Oropharynx is clear and moist.  Eyes: Pupils are equal, round, and reactive to light. Conjunctivae and EOM are normal. No scleral icterus.  Neck: Neck supple. No thyromegaly present.  Cardiovascular: Normal rate, regular rhythm, normal heart sounds and intact distal pulses.  No murmur heard. Pulmonary/Chest: Effort normal and breath sounds normal. No respiratory distress. She has no wheezes. She has no rales.  Abdominal: Soft. Bowel sounds are normal. She exhibits no distension. There is no tenderness. There is no rebound and no guarding.  Musculoskeletal: She exhibits no edema or deformity.  Lymphadenopathy:    She has no cervical adenopathy.  Neurological: She is alert and  oriented to person, place, and time.  Skin: Skin is warm and dry. Capillary refill takes less than 2 seconds. No rash noted.  Psychiatric: She has a normal mood and affect. Her behavior is normal.  Vitals reviewed.    Depression Screen PHQ 2/9 Scores 09/30/2018 07/08/2018 04/19/2017 11/28/2015  PHQ - 2 Score 0 0 3 0  PHQ- 9 Score 2 2 15  -    Assessment & Plan:     Establish Care  Exercise Activities and Dietary recommendations Goals   None     Immunization History  Administered Date(s) Administered  . Influenza-Unspecified 10/15/2017, 09/09/2018  . Tdap 05/23/2018    Health Maintenance  Topic Date Due  . PAP SMEAR  05/23/2021  . TETANUS/TDAP  05/23/2028  . INFLUENZA VACCINE  Completed  . HIV Screening  Completed     Discussed health benefits of physical activity, and encouraged her to engage in regular exercise appropriate for her age and condition.    --------------------------------------------------------------------  Problem List Items Addressed This Visit      Cardiovascular and Mediastinum   Hypertension    Well controlled Continue metoprolol Reviewed recent labs      SVT (supraventricular tachycardia) ([place])    Well controlled Continue metoprolol Continue diltiazem prn        Other   HSV-1 (herpes simplex virus 1) infection    Stable no symptoms currently Continue valtrex prn      History of nephrolithiasis    Last episode ~9-10 yrs ago asymptomatic      Generalized anxiety disorder - Primary    Chronic  Uncontrolled Has stopped buspar Continue therapy Discussed synergism between meds and therapy Could try cymbalta - discussed possible side effects Patient will let me know if she decides to start a medication      Irregular menstrual cycle    Doing well on OCPs Discussed 3 months continuous dosing and will try this      Relevant Medications   JUNEL FE 1/20 1-20 MG-MCG tablet    Other Visit Diagnoses    Screening for breast  cancer       Relevant Orders  MM 3D SCREEN BREAST BILATERAL       Return in about 9 months (around 07/01/2019) for CPE.   The entirety of the information documented in the History of Present Illness, Review of Systems and Physical Exam were personally obtained by me. Portions of this information were initially documented by Tiburcio Pea and Ashley Royalty, CMA and reviewed by me for thoroughness and accuracy.    Virginia Crews, MD, MPH [facility] 09/30/2018 1:00 PM

## 2018-09-30 NOTE — Assessment & Plan Note (Signed)
Stable no symptoms currently Continue valtrex prn

## 2018-09-30 NOTE — Assessment & Plan Note (Signed)
Well controlled Continue metoprolol Continue diltiazem prn

## 2018-09-30 NOTE — Assessment & Plan Note (Signed)
Last episode ~9-10 yrs ago asymptomatic

## 2018-10-10 ENCOUNTER — Ambulatory Visit: Payer: Self-pay | Admitting: Family

## 2018-10-11 ENCOUNTER — Ambulatory Visit: Payer: 59 | Admitting: Psychology

## 2018-10-11 DIAGNOSIS — F411 Generalized anxiety disorder: Secondary | ICD-10-CM

## 2018-10-25 ENCOUNTER — Ambulatory Visit: Payer: 59 | Admitting: Psychology

## 2018-10-25 DIAGNOSIS — F411 Generalized anxiety disorder: Secondary | ICD-10-CM | POA: Diagnosis not present

## 2018-10-26 ENCOUNTER — Ambulatory Visit
Admission: RE | Admit: 2018-10-26 | Discharge: 2018-10-26 | Disposition: A | Discharge status: Home / Self Care | Payer: Managed Care, Other (non HMO) | Source: Ambulatory Visit | Attending: Family Medicine | Admitting: Family Medicine

## 2018-10-26 DIAGNOSIS — Z1239 Encounter for other screening for malignant neoplasm of breast: Secondary | ICD-10-CM | POA: Insufficient documentation

## 2018-11-17 ENCOUNTER — Ambulatory Visit: Payer: 59 | Admitting: Psychology

## 2018-12-16 ENCOUNTER — Encounter: Payer: Self-pay | Admitting: Family Medicine

## 2018-12-20 MED ORDER — DULOXETINE HCL 30 MG PO CPEP: 30.0000 mg | ORAL_CAPSULE | Freq: Every day | ORAL | 3 refills | Status: DC

## 2019-01-21 ENCOUNTER — Other Ambulatory Visit: Payer: Self-pay | Admitting: Cardiovascular Disease

## 2019-01-21 DIAGNOSIS — I471 Supraventricular tachycardia: Secondary | ICD-10-CM

## 2019-03-01 ENCOUNTER — Encounter: Payer: Self-pay | Admitting: Family Medicine

## 2019-04-20 ENCOUNTER — Encounter: Payer: Self-pay | Admitting: Family Medicine

## 2019-06-16 ENCOUNTER — Telehealth: Payer: Self-pay

## 2019-06-16 ENCOUNTER — Encounter: Payer: Self-pay | Admitting: Family Medicine

## 2019-06-16 NOTE — Telephone Encounter (Signed)
Patient called stating that she would like to be tested for COVID-19 due to a co-worker testing positive for the virus. Please advise.

## 2019-06-16 NOTE — Telephone Encounter (Signed)
OK to place order and have her go to drive up testing site.  Needs to self-quarantine until results are back.  Call if she develops any symptoms

## 2019-06-16 NOTE — Telephone Encounter (Signed)
Okay to place order in chart? KW

## 2019-06-19 ENCOUNTER — Other Ambulatory Visit: Payer: Self-pay

## 2019-06-19 DIAGNOSIS — Z20822 Contact with and (suspected) exposure to covid-19: Secondary | ICD-10-CM

## 2019-06-20 NOTE — Telephone Encounter (Signed)
[place]

## 2019-06-20 NOTE — Telephone Encounter (Signed)
Returned patient call and patient states that she went on 06/19/2019 to get her COVID test done.

## 2019-06-20 NOTE — Telephone Encounter (Signed)
Pt returned missed call.  Please call pt back.  Thanks, [employer]

## 2019-06-22 LAB — NOVEL CORONAVIRUS, NAA: SARS-CoV-2, NAA: NOT DETECTED

## 2019-06-26 ENCOUNTER — Telehealth: Payer: Self-pay

## 2019-06-26 NOTE — Telephone Encounter (Signed)
-----   Message from Virginia Crews, MD sent at 06/23/2019  9:25 AM EDT ----- Patient negative for COVID

## 2019-06-26 NOTE — Telephone Encounter (Signed)
LMTCB

## 2019-06-27 NOTE — Telephone Encounter (Signed)
Patient was advised and states she seen results in [place].

## 2019-07-10 ENCOUNTER — Other Ambulatory Visit: Payer: Self-pay | Admitting: Cardiovascular Disease

## 2019-07-10 DIAGNOSIS — I471 Supraventricular tachycardia: Secondary | ICD-10-CM

## 2019-07-11 ENCOUNTER — Encounter: Payer: Self-pay | Admitting: Family Medicine

## 2019-07-11 ENCOUNTER — Other Ambulatory Visit: Payer: Self-pay

## 2019-07-11 ENCOUNTER — Ambulatory Visit (INDEPENDENT_AMBULATORY_CARE_PROVIDER_SITE_OTHER): Payer: Managed Care, Other (non HMO) | Admitting: Family Medicine

## 2019-07-11 ENCOUNTER — Other Ambulatory Visit (HOSPITAL_COMMUNITY)
Admission: RE | Admit: 2019-07-11 | Discharge: 2019-07-11 | Disposition: A | Discharge status: Home / Self Care | Payer: Managed Care, Other (non HMO) | Source: Ambulatory Visit | Attending: Family Medicine | Admitting: Family Medicine

## 2019-07-11 VITALS — BP 130/82 | HR 83 | Temp 98.3°F | Ht 63.0 in | Wt 124.4 lb

## 2019-07-11 DIAGNOSIS — F411 Generalized anxiety disorder: Secondary | ICD-10-CM

## 2019-07-11 DIAGNOSIS — Z0000 Encounter for general adult medical examination without abnormal findings: Secondary | ICD-10-CM

## 2019-07-11 DIAGNOSIS — N926 Irregular menstruation, unspecified: Secondary | ICD-10-CM

## 2019-07-11 DIAGNOSIS — B009 Herpesviral infection, unspecified: Secondary | ICD-10-CM

## 2019-07-11 DIAGNOSIS — Z8742 Personal history of other diseases of the female genital tract: Secondary | ICD-10-CM | POA: Insufficient documentation

## 2019-07-11 DIAGNOSIS — Z1239 Encounter for other screening for malignant neoplasm of breast: Secondary | ICD-10-CM | POA: Diagnosis not present

## 2019-07-11 DIAGNOSIS — I10 Essential (primary) hypertension: Secondary | ICD-10-CM

## 2019-07-11 DIAGNOSIS — I471 Supraventricular tachycardia: Secondary | ICD-10-CM

## 2019-07-11 MED ORDER — METOPROLOL SUCCINATE ER 25 MG PO TB24: ORAL_TABLET | ORAL | 1 refills | Status: DC

## 2019-07-11 MED ORDER — DULOXETINE HCL 30 MG PO CPEP: 30.0000 mg | ORAL_CAPSULE | Freq: Every day | ORAL | 1 refills | Status: DC

## 2019-07-11 MED ORDER — VALACYCLOVIR HCL 1 G PO TABS: 1000.0000 mg | ORAL_TABLET | Freq: Two times a day (BID) | ORAL | 2 refills | Status: DC

## 2019-07-11 MED ORDER — JUNEL FE 1/20 1-20 MG-MCG PO TABS: 1.0000 | ORAL_TABLET | Freq: Every day | ORAL | 3 refills | Status: DC

## 2019-07-11 MED ORDER — DILTIAZEM HCL 30 MG PO TABS: 30.0000 mg | ORAL_TABLET | Freq: Four times a day (QID) | ORAL | 3 refills | Status: DC | PRN

## 2019-07-11 NOTE — Patient Instructions (Signed)

## 2019-07-11 NOTE — Assessment & Plan Note (Signed)
Well controlled Continue metoprolol Recheck metabolic panel

## 2019-07-11 NOTE — Assessment & Plan Note (Signed)
Chronic and stable Continue Cymbalta

## 2019-07-11 NOTE — Assessment & Plan Note (Signed)
Doing well on OCPs Continue continuous dosing

## 2019-07-11 NOTE — Assessment & Plan Note (Signed)
Well controlled Continue metoprolol Continue diltiazem prn

## 2019-07-11 NOTE — Assessment & Plan Note (Signed)
Stable No symptoms currently Continue valtrex prn

## 2019-07-11 NOTE — Telephone Encounter (Signed)
Pt due for 12 month f/u with [facility]. Please contact pt for future appointment. Pt needing refills.

## 2019-07-11 NOTE — Progress Notes (Signed)
Patient: Betty Rodriguez, Female    DOB: 1971/03/27, 48 y.o.   MRN: [id number] Visit Date: 07/11/2019  Today's Provider: Lavon Paganini, MD   Chief Complaint  Patient presents with  . Annual Exam   Subjective:  I, Tiburcio Pea, CMA, am acting as a scribe for Lavon Paganini, MD.    Annual physical exam Betty Rodriguez is a 48 y.o. female who presents today for health maintenance and complete physical. She feels well. She reports no regular exercise. She reports she is sleeping fairly well. Patient works 3rd shift.   ----------------------------------------------------------------- Last Pap:05/23/2018. History of abnormal pap smear and Cryo Last mammogram:10/26/2018  Review of Systems  Constitutional: Negative.   HENT: Negative.   Eyes: Negative.   Respiratory: Negative.   Cardiovascular: Negative.   Gastrointestinal: Negative.   Endocrine: Negative.   Genitourinary: Negative.   Musculoskeletal: Negative.   Skin: Negative.   Allergic/Immunologic: Negative.   Neurological: Positive for headaches.  Hematological: Negative.   Psychiatric/Behavioral: Positive for sleep disturbance. The patient is nervous/anxious.     Social History She  reports that she has never smoked. She has never used smokeless tobacco. She reports current alcohol use. She reports that she does not use drugs. Social History   Socioeconomic History  . Marital status: Divorced    Spouse name: Not on file  . Number of children: 3  . Years of education: Not on file  . Highest education level: Not on file  Occupational History  . Occupation: Radiation protection practitioner  Social Needs  . Financial resource strain: Not on file  . Food insecurity    Worry: Not on file    Inability: Not on file  . Transportation needs    Medical: Not on file    Non-medical: Not on file  Tobacco Use  . Smoking status: Never Smoker  . Smokeless tobacco: Never Used  Substance and Sexual Activity  . Alcohol use:  Yes    Alcohol/week: 0.0 standard drinks    Comment: Rare   . Drug use: No  . Sexual activity: Yes    Partners: Male    Birth control/protection: OCP    Comment: 1 partner   Lifestyle  . Physical activity    Days per week: Not on file    Minutes per session: Not on file  . Stress: Not on file  Relationships  . Social Herbalist on phone: Not on file    Gets together: Not on file    Attends religious service: Not on file    Active member of club or organization: Not on file    Attends meetings of clubs or organizations: Not on file    Relationship status: Not on file  Other Topics Concern  . 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 Irregular menstrual cycle 05/23/2018  . Atrial tachycardia ([place]) 10/08/2017  . SVT (supraventricular tachycardia) ([place]) 04/21/2017  . Hypertension 04/19/2017  . Generalized anxiety disorder 02/21/2016  . 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 History of nephrolithiasis 11/28/2015    Past Surgical History:  Procedure Laterality Date  . BREAST BIOPSY Right 07/06/2016  us/ core/neg  . TONSILLECTOMY  1983    Family History  Family Status  Relation Name Status  . Mother  Alive  . Father  Alive  . Brother  Alive  . MGM  (Not Specified)  . MGF  (Not Specified)  . PGM  (Not Specified)  . Neg Hx  (Not Specified)   Her family history includes Diabetes in her maternal grandfather and maternal grandmother; Heart disease in her maternal grandfather and maternal grandmother; Hyperlipidemia in her father; Hypertension in her father, maternal grandfather, and maternal grandmother; Multiple sclerosis in her paternal grandmother; Stroke (age of onset: 70) in her maternal  grandmother.     Allergies  Allergen Reactions  . Sulfa Antibiotics Hives  . Zoloft [Sertraline]     SVT    Previous Medications   No medications on file    Patient Care Team: Virginia Crews, MD as PCP - General (Family Medicine)      Objective:   Vitals: BP 130/82 (BP Location: Right Arm, Patient Position: Sitting, Cuff Size: Normal)   Pulse 83   Temp 98.3 F (36.8 C) (Oral)   Ht 5' 3"  (1.6 m)   Wt 124 lb 6.4 oz (56.4 kg)   SpO2 98%   BMI 22.04 kg/m     Physical Exam Vitals signs reviewed.  Constitutional:      General: She is not in acute distress.    Appearance: Normal appearance. She is well-developed. She is not diaphoretic.  HENT:     Head: Normocephalic and atraumatic.     Right Ear: Tympanic membrane, ear canal and external ear normal.     Left Ear: Tympanic membrane, ear canal and external ear normal.  Eyes:     General: No scleral icterus.    Conjunctiva/sclera: Conjunctivae normal.     Pupils: Pupils are equal, round, and reactive to light.  Neck:     Musculoskeletal: Neck supple.     Thyroid: No thyromegaly.  Cardiovascular:     Rate and Rhythm: Normal rate and regular rhythm.     Pulses: Normal pulses.     Heart sounds: Normal heart sounds. No murmur.  Pulmonary:     Effort: Pulmonary effort is normal. No respiratory distress.     Breath sounds: Normal breath sounds. No wheezing or rales.  Abdominal:     General: There is no distension.     Palpations: Abdomen is soft.     Tenderness: There is no abdominal tenderness.  Genitourinary:    Comments: Breasts: breasts appear normal, no suspicious masses, no skin or nipple changes or axillary nodes.  GYN:  External genitalia within normal limits.  Vaginal mucosa pink, moist, normal rugae.  Friable cervix without lesions, no discharge or bleeding noted on speculum exam.  Bimanual exam revealed normal, nongravid uterus.  No cervical motion tenderness. No adnexal masses bilaterally.     Musculoskeletal:        General: No deformity.     Right lower leg: No edema.     Left lower leg: No edema.  Lymphadenopathy:     Cervical: No cervical adenopathy.  Skin:    General: Skin is warm and dry.     Capillary Refill: Capillary refill takes less than 2 seconds.     Findings: No rash.  Neurological:     Mental Status: She is alert and oriented to person, place, and time. Mental status is at baseline.  Psychiatric:        Mood and Affect: Mood  normal.        Behavior: Behavior normal.        Thought Content: Thought content normal.      Depression Screen PHQ 2/9 Scores 07/11/2019 09/30/2018 07/08/2018 04/19/2017  PHQ - 2 Score 0 0 0 3  PHQ- 9 Score 5 2 2 15      Assessment & Plan:     Routine Health Maintenance and Physical Exam  Exercise Activities and Dietary recommendations Goals   None     Immunization History  Administered Date(s) Administered  . Influenza-Unspecified 10/15/2017, 09/09/2018  . Tdap 05/23/2018    Health Maintenance  Topic Date Due  . INFLUENZA VACCINE  07/01/2019  . PAP SMEAR-Modifier  05/23/2021  . TETANUS/TDAP  05/23/2028  . HIV Screening  Completed     Discussed health benefits of physical activity, and encouraged her to engage in regular exercise appropriate for her age and condition.    --------------------------------------------------------------------  Problem List Items Addressed This Visit      Cardiovascular and Mediastinum   Hypertension    Well controlled Continue metoprolol Recheck metabolic panel      Relevant Medications   metoprolol succinate (TOPROL-XL) 25 MG 24 hr tablet   diltiazem (CARDIZEM) 30 MG tablet   SVT (supraventricular tachycardia) ([facility])    Well controlled Continue metoprolol Continue diltiazem prn      Relevant Medications   metoprolol succinate (TOPROL-XL) 25 MG 24 hr tablet   diltiazem (CARDIZEM) 30 MG tablet     Other   HSV-1 (herpes simplex virus 1) infection    Stable No  symptoms currently Continue valtrex prn      Relevant Medications   valACYclovir (VALTREX) 1000 MG tablet   Generalized anxiety disorder    Chronic and stable Continue Cymbalta      Relevant Medications   DULoxetine (CYMBALTA) 30 MG capsule   Irregular menstrual cycle    Doing well on OCPs Continue continuous dosing      Relevant Medications   JUNEL FE 1/20 1-20 MG-MCG tablet    Other Visit Diagnoses    Encounter for annual physical exam    -  Primary   Relevant Orders   CBC with Differential/Platelet   Comprehensive metabolic panel   Lipid panel   TSH   MM 3D SCREEN BREAST BILATERAL   Hemoglobin A1c   Cytology - PAP   Screening for breast cancer       Relevant Orders   MM 3D SCREEN BREAST BILATERAL   History of abnormal cervical Pap smear       Relevant Orders   Cytology - PAP       Return in about 6 months (around 01/11/2020) for chronic disease f/u.   The entirety of the information documented in the History of Present Illness, Review of Systems and Physical Exam were personally obtained by me. Portions of this information were initially documented by Tiburcio Pea, CMA and reviewed by me for thoroughness and accuracy.    Brockton Mckesson, Dionne Bucy, MD MPH [facility]

## 2019-07-12 LAB — COMPREHENSIVE METABOLIC PANEL
ALT: 13 IU/L (ref 0–32)
AST: 18 IU/L (ref 0–40)
Albumin/Globulin Ratio: 1.9 (ref 1.2–2.2)
Albumin: 4.4 g/dL (ref 3.8–4.8)
Alkaline Phosphatase: 35 IU/L — ABNORMAL LOW (ref 39–117)
BUN/Creatinine Ratio: 18 (ref 9–23)
BUN: 14 mg/dL (ref 6–24)
Bilirubin Total: 0.3 mg/dL (ref 0.0–1.2)
CO2: 23 mmol/L (ref 20–29)
Calcium: 9.4 mg/dL (ref 8.7–10.2)
Chloride: 101 mmol/L (ref 96–106)
Creatinine, Ser: 0.78 mg/dL (ref 0.57–1.00)
GFR calc Af Amer: 105 mL/min/{1.73_m2} (ref >59)
GFR calc non Af Amer: 91 mL/min/{1.73_m2} (ref >59)
Globulin, Total: 2.3 g/dL (ref 1.5–4.5)
Glucose: 107 mg/dL — ABNORMAL HIGH (ref 65–99)
Potassium: 4.3 mmol/L (ref 3.5–5.2)
Sodium: 138 mmol/L (ref 134–144)
Total Protein: 6.7 g/dL (ref 6.0–8.5)

## 2019-07-12 LAB — CBC WITH DIFFERENTIAL/PLATELET
Basophils Absolute: 0 10*3/uL (ref 0.0–0.2)
Basos: 1 %
EOS (ABSOLUTE): 0.1 10*3/uL (ref 0.0–0.4)
Eos: 1 %
Hematocrit: 36.9 % (ref 34.0–46.6)
Hemoglobin: 12.2 g/dL (ref 11.1–15.9)
Immature Grans (Abs): 0 10*3/uL (ref 0.0–0.1)
Immature Granulocytes: 0 %
Lymphocytes Absolute: 1.9 10*3/uL (ref 0.7–3.1)
Lymphs: 29 %
MCH: 30.5 pg (ref 26.6–33.0)
MCHC: 33.1 g/dL (ref 31.5–35.7)
MCV: 92 fL (ref 79–97)
Monocytes Absolute: 0.3 10*3/uL (ref 0.1–0.9)
Monocytes: 5 %
Neutrophils Absolute: 4.2 10*3/uL (ref 1.4–7.0)
Neutrophils: 64 %
Platelets: 299 10*3/uL (ref 150–450)
RBC: 4 x10E6/uL (ref 3.77–5.28)
RDW: 12.6 % (ref 11.7–15.4)
WBC: 6.6 10*3/uL (ref 3.4–10.8)

## 2019-07-12 LAB — TSH: TSH: 2.65 u[IU]/mL (ref 0.450–4.500)

## 2019-07-12 LAB — LIPID PANEL
Chol/HDL Ratio: 3 ratio (ref 0.0–4.4)
Cholesterol, Total: 197 mg/dL (ref 100–199)
HDL: 65 mg/dL (ref >39)
LDL Calculated: 111 mg/dL — ABNORMAL HIGH (ref 0–99)
Triglycerides: 104 mg/dL (ref 0–149)
VLDL Cholesterol Cal: 21 mg/dL (ref 5–40)

## 2019-07-12 LAB — HEMOGLOBIN A1C
Est. average glucose Bld gHb Est-mCnc: 108 mg/dL
Hgb A1c MFr Bld: 5.4 % (ref 4.8–5.6)

## 2019-07-13 LAB — CYTOLOGY - PAP
Diagnosis: NEGATIVE
HPV: NOT DETECTED

## 2019-07-26 ENCOUNTER — Other Ambulatory Visit: Payer: Self-pay | Admitting: Cardiovascular Disease

## 2019-07-26 DIAGNOSIS — I471 Supraventricular tachycardia, unspecified: Secondary | ICD-10-CM

## 2019-07-27 ENCOUNTER — Other Ambulatory Visit: Payer: Self-pay

## 2019-07-27 NOTE — Telephone Encounter (Signed)
Patient has been left a voicemail to call back and schedule 12 month fu (08/20) for prescription refills

## 2019-07-27 NOTE — Telephone Encounter (Signed)
Pt due for 12 month f/u. Please contact pt for future appointment.

## 2019-07-27 NOTE — Telephone Encounter (Signed)
LMOVM to call and schedule a 12 mo follow up

## 2019-08-02 ENCOUNTER — Encounter: Payer: Self-pay | Admitting: Cardiovascular Disease

## 2019-08-02 NOTE — Telephone Encounter (Signed)
Unable to contact x 34.  Mailed letter .  Deleting recall

## 2019-08-02 NOTE — Telephone Encounter (Signed)
Mailed letter. Unable to reach

## 2019-08-30 ENCOUNTER — Ambulatory Visit (INDEPENDENT_AMBULATORY_CARE_PROVIDER_SITE_OTHER): Payer: Managed Care, Other (non HMO)

## 2019-08-30 ENCOUNTER — Other Ambulatory Visit: Payer: Self-pay

## 2019-08-30 DIAGNOSIS — Z23 Encounter for immunization: Secondary | ICD-10-CM | POA: Diagnosis not present

## 2019-09-01 ENCOUNTER — Other Ambulatory Visit: Payer: Self-pay

## 2019-09-01 DIAGNOSIS — Z20822 Contact with and (suspected) exposure to covid-19: Secondary | ICD-10-CM

## 2019-09-02 LAB — NOVEL CORONAVIRUS, NAA: SARS-CoV-2, NAA: NOT DETECTED

## 2019-09-08 ENCOUNTER — Encounter: Payer: Self-pay | Admitting: Family Medicine

## 2019-09-15 ENCOUNTER — Other Ambulatory Visit: Payer: Self-pay

## 2019-09-15 ENCOUNTER — Ambulatory Visit: Payer: Managed Care, Other (non HMO) | Admitting: Family Medicine

## 2019-09-15 ENCOUNTER — Encounter: Payer: Self-pay | Admitting: Family Medicine

## 2019-09-15 VITALS — BP 123/85 | HR 82 | Temp 96.9°F | Wt 125.8 lb

## 2019-09-15 DIAGNOSIS — M7061 Trochanteric bursitis, right hip: Secondary | ICD-10-CM | POA: Diagnosis not present

## 2019-09-15 MED ORDER — MELOXICAM 15 MG PO TABS: 15.0000 mg | ORAL_TABLET | Freq: Every day | ORAL | 0 refills | Status: DC

## 2019-09-15 NOTE — Progress Notes (Signed)
Patient: Betty Rodriguez Female    DOB: 05/21/1971   48 y.o.   MRN: [id number] Visit Date: 09/15/2019  Today's Provider: Lavon Paganini, MD   Chief Complaint  Patient presents with  . Hip Pain   Subjective:    I, Porsha McClurkin CMA, am acting as a scribe for Lavon Paganini, MD.    Hip Pain  The incident occurred more than 1 week ago. There was no injury mechanism. The pain is present in the right hip. The quality of the pain is described as aching and stabbing. The pain is at a severity of 7/10 (When aching pain around a 4 per pt). The pain has been constant since onset. Associated symptoms include an inability to bear weight. She reports no foreign bodies present. The symptoms are aggravated by movement and weight bearing. She has tried acetaminophen and NSAIDs for the symptoms. The treatment provided mild relief.   Started ~2 months ago.  Getting worse.Will have sharp pain with pivoting/twisting.  Able to walk, but hurts to do so. Lateral R hip.  Hurts to lay on that side.  No weakness or numbness. No groin pain  Allergies  Allergen Reactions  . Sulfa Antibiotics Hives  . Zoloft [Sertraline]     SVT     Current Outpatient Medications:  .  diltiazem (CARDIZEM) 30 MG tablet, Take 1 tablet (30 mg total) by mouth every 6 (six) hours as needed (palpitations)., Disp: 60 tablet, Rfl: 3 .  DULoxetine (CYMBALTA) 30 MG capsule, Take 1 capsule (30 mg total) by mouth daily., Disp: 90 capsule, Rfl: 1 .  JUNEL FE 1/20 1-20 MG-MCG tablet, Take 1 tablet by mouth daily. Take 4 packs continuously and then break for 1 week, Disp: 4 Package, Rfl: 3 .  metoprolol succinate (TOPROL-XL) 25 MG 24 hr tablet, TAKE 1 TABLET BY MOUTH  DAILY WITH OR IMMEDIATELY  FOLLOWING A MEAL, Disp: 90 tablet, Rfl: 1 .  valACYclovir (VALTREX) 1000 MG tablet, Take 1 tablet (1,000 mg total) by mouth 2 (two) times daily. for 3 days as needed, Disp: 20 tablet, Rfl: 2  Review of Systems  Constitutional:  Negative.   Respiratory: Negative.   Cardiovascular: Negative.   Musculoskeletal: Negative.     Social History   Tobacco Use  . Smoking status: Never Smoker  . Smokeless tobacco: Never Used  Substance Use Topics  . Alcohol use: Yes    Alcohol/week: 0.0 standard drinks    Comment: Rare       Objective:   BP 123/85 (BP Location: Left Arm, Patient Position: Sitting, Cuff Size: Normal)   Pulse 82   Temp (!) 96.9 F (36.1 C) (Temporal)   Wt 125 lb 12.8 oz (57.1 kg)   SpO2 98%   BMI 22.28 kg/m  Vitals:   09/15/19 1332  BP: 123/85  Pulse: 82  Temp: (!) 96.9 F (36.1 C)  TempSrc: Temporal  SpO2: 98%  Weight: 125 lb 12.8 oz (57.1 kg)  Body mass index is 22.28 kg/m.   Physical Exam Constitutional:      General: She is not in acute distress.    Appearance: Normal appearance. She is not diaphoretic.  HENT:     Head: Normocephalic and atraumatic.  Eyes:     Conjunctiva/sclera: Conjunctivae normal.  Cardiovascular:     Rate and Rhythm: Normal rate and regular rhythm.     Pulses: Normal pulses.     Heart sounds: Normal heart sounds. No murmur.  Pulmonary:  Effort: Pulmonary effort is normal. No respiratory distress.     Breath sounds: Normal breath sounds. No wheezing.  Musculoskeletal:     Right lower leg: No edema.     Left lower leg: No edema.     Comments: R Hip: ROM intact Strength IR: 5/5, ER: 5/5, Flexion: 5/5, Extension: 5/5, Abduction: 4+/5, Adduction: 5/5 Pelvic alignment unremarkable to inspection and palpation. Greater trochanter with mild tenderness to palpation. No SI joint tenderness and normal minimal SI movement.   Skin:    General: Skin is warm and dry.     Capillary Refill: Capillary refill takes less than 2 seconds.     Findings: No rash.  Neurological:     Mental Status: She is alert and oriented to person, place, and time. Mental status is at baseline.     Sensory: No sensory deficit.     Motor: No weakness.     Gait: Gait normal.   Psychiatric:        Mood and Affect: Mood normal.        Behavior: Behavior normal.      No results found for any visits on 09/15/19.     Assessment & Plan   1. Trochanteric bursitis of right hip - new problem, but ongoing for several months - TTP is minimal - patient has weakness of abductors and tightness of IT band - discussed HEP - offered formal PT (patient prefers HEP) - trial of meloxicam - return precautions discussed   Meds ordered this encounter  Medications  . meloxicam (MOBIC) 15 MG tablet    Sig: Take 1 tablet (15 mg total) by mouth daily.    Dispense:  30 tablet    Refill:  0     Return if symptoms worsen or fail to improve.   The entirety of the information documented in the History of Present Illness, Review of Systems and Physical Exam were personally obtained by me. Portions of this information were initially documented by [facility] , CMA and reviewed by me for thoroughness and accuracy.    Evaleen Sant, Dionne Bucy, MD MPH [facility]

## 2019-09-18 ENCOUNTER — Other Ambulatory Visit: Payer: Self-pay

## 2019-09-18 MED ORDER — MELOXICAM 15 MG PO TABS: 15.0000 mg | ORAL_TABLET | Freq: Every day | ORAL | 1 refills | Status: DC

## 2019-09-28 ENCOUNTER — Encounter: Payer: Self-pay | Admitting: Family Medicine

## 2019-10-10 ENCOUNTER — Encounter: Payer: Self-pay | Admitting: Family Medicine

## 2019-10-11 ENCOUNTER — Telehealth: Payer: Self-pay

## 2019-10-11 DIAGNOSIS — M7061 Trochanteric bursitis, right hip: Secondary | ICD-10-CM

## 2019-10-11 NOTE — Telephone Encounter (Signed)
Patient requesting referral to ortho, due to hip pain.

## 2019-10-24 ENCOUNTER — Telehealth: Payer: Self-pay

## 2019-10-24 ENCOUNTER — Encounter: Payer: Self-pay | Admitting: Family Medicine

## 2019-10-24 NOTE — Telephone Encounter (Signed)
Dr. B please advise.   Betty Rodriguez, Betty Rodriguez [facility]  [facility]  Phone Number: [phone]        I saw Dr Harlow Mares at [facility]. 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 I have some bone spurs. He recommended physical therapy and staying on the meds you gave me. He said I would need a hip replacement eventually. What are your thoughts about staying on the medicine? It seems to help some because when I do not take it I have noticed pain is alot worse.

## 2019-10-24 NOTE — Telephone Encounter (Signed)
I think that this was probably a MyChart message, so we can send her a reply to her in that form.  It is okay to keep taking meloxicam if this is what is needed for pain control.  This is an anti-inflammatory and does help with arthritis pain and inflammation.  When it comes to NSAIDs, like this medication, we do like to use the lowest dose possible for the shortest amount of time possible, but this is the best treatment for your current pain.  Watch out for any upper abdominal pain, blood in stool as it can give some gastritis or ulcers over time.  Be sure to take it with food to prevent this as well.  Let us know if you develop the symptoms.

## 2019-10-24 NOTE — Telephone Encounter (Signed)
Sent on [employer].

## 2019-10-31 ENCOUNTER — Ambulatory Visit
Admission: RE | Admit: 2019-10-31 | Discharge: 2019-10-31 | Disposition: A | Discharge status: Home / Self Care | Payer: Managed Care, Other (non HMO) | Source: Ambulatory Visit | Attending: Family Medicine | Admitting: Family Medicine

## 2019-10-31 DIAGNOSIS — Z0000 Encounter for general adult medical examination without abnormal findings: Secondary | ICD-10-CM

## 2019-10-31 DIAGNOSIS — Z1231 Encounter for screening mammogram for malignant neoplasm of breast: Secondary | ICD-10-CM | POA: Insufficient documentation

## 2019-10-31 DIAGNOSIS — Z1239 Encounter for other screening for malignant neoplasm of breast: Secondary | ICD-10-CM

## 2019-11-01 ENCOUNTER — Telehealth: Payer: Self-pay

## 2019-11-01 NOTE — Telephone Encounter (Signed)
Viewed by Gordy Savers Cadieux on 10/31/2019 5:45 PM

## 2019-11-01 NOTE — Telephone Encounter (Signed)
-----   Message from Virginia Crews, MD sent at 10/31/2019  4:13 PM EST ----- Normal mammogram. Repeat in 1 yr

## 2019-11-02 ENCOUNTER — Encounter: Payer: Self-pay | Admitting: Family Medicine

## 2019-11-02 MED ORDER — MELOXICAM 15 MG PO TABS: 15.0000 mg | ORAL_TABLET | Freq: Every day | ORAL | 1 refills | Status: DC

## 2019-11-03 ENCOUNTER — Other Ambulatory Visit: Payer: Self-pay

## 2019-11-03 DIAGNOSIS — Z20822 Contact with and (suspected) exposure to covid-19: Secondary | ICD-10-CM

## 2019-11-05 LAB — NOVEL CORONAVIRUS, NAA: SARS-CoV-2, NAA: NOT DETECTED

## 2019-11-10 ENCOUNTER — Encounter: Payer: Self-pay | Admitting: Family Medicine

## 2019-11-12 ENCOUNTER — Encounter: Payer: Self-pay | Admitting: Family Medicine

## 2019-11-21 ENCOUNTER — Encounter: Payer: Self-pay | Admitting: Family Medicine

## 2019-11-21 DIAGNOSIS — B379 Candidiasis, unspecified: Secondary | ICD-10-CM

## 2019-11-21 DIAGNOSIS — T3695XA Adverse effect of unspecified systemic antibiotic, initial encounter: Secondary | ICD-10-CM

## 2019-11-21 DIAGNOSIS — J029 Acute pharyngitis, unspecified: Secondary | ICD-10-CM

## 2019-11-22 MED ORDER — FLUCONAZOLE 150 MG PO TABS: 150.0000 mg | ORAL_TABLET | Freq: Once | ORAL | 0 refills | Status: AC

## 2019-11-22 MED ORDER — AMOXICILLIN 875 MG PO TABS: 875.0000 mg | ORAL_TABLET | Freq: Two times a day (BID) | ORAL | 0 refills | Status: DC

## 2019-11-22 NOTE — Addendum Note (Signed)
Addended by: Mar Daring on: 11/22/2019 10:40 AM   Modules accepted: Orders

## 2019-12-12 ENCOUNTER — Encounter: Payer: Self-pay | Admitting: Family Medicine

## 2019-12-12 DIAGNOSIS — Z1152 Encounter for screening for COVID-19: Secondary | ICD-10-CM

## 2019-12-13 NOTE — Telephone Encounter (Signed)
Please figure out why patient is wanting this done.  Past infection, current symptoms, screening?  It is not to test for current infection and should not be done if symptomatic.  Does not give [place] much information as antibodies for COVID do not necessarily mean that you cannot get infected again and they do not seem to last for very long.

## 2019-12-13 NOTE — Telephone Encounter (Signed)
Please let patient know that has been ordered and how she can come in to have this drawn

## 2020-01-05 NOTE — Telephone Encounter (Signed)
Opened in error. KW °

## 2020-01-16 ENCOUNTER — Ambulatory Visit: Payer: Managed Care, Other (non HMO) | Admitting: Family Medicine

## 2020-02-02 ENCOUNTER — Ambulatory Visit: Payer: Managed Care, Other (non HMO) | Attending: Internal Medicine

## 2020-02-02 DIAGNOSIS — Z23 Encounter for immunization: Secondary | ICD-10-CM | POA: Insufficient documentation

## 2020-02-02 NOTE — Progress Notes (Signed)
   Covid-19 Vaccination Clinic  Name:  Betty Rodriguez    MRN: [id number] DOB: 1971/02/22  02/02/2020  [name] was observed post Covid-19 immunization for 15 minutes without incident. She was provided with Vaccine Information Sheet and instruction to access the V-Safe system.   [name] was instructed to call 911 with any severe reactions post vaccine: Marland Kitchen Difficulty breathing  . Swelling of face and throat  . A fast heartbeat  . A bad rash all over body  . Dizziness and weakness   Immunizations Administered    Name Date Dose VIS Date Route   Pfizer COVID-19 Vaccine 02/02/2020  8:20 AM 0.3 mL 11/10/2019 Intramuscular   Manufacturer: [place]   Lot: [id number]   [place]: [id number]

## 2020-02-15 ENCOUNTER — Encounter: Payer: Self-pay | Admitting: Family Medicine

## 2020-02-23 ENCOUNTER — Ambulatory Visit: Payer: Self-pay

## 2020-03-13 ENCOUNTER — Ambulatory Visit: Payer: Managed Care, Other (non HMO) | Attending: Internal Medicine

## 2020-03-13 DIAGNOSIS — Z23 Encounter for immunization: Secondary | ICD-10-CM

## 2020-03-13 NOTE — Progress Notes (Signed)
   Covid-19 Vaccination Clinic  Name:  Betty Rodriguez    MRN: [id number] DOB: 02-02-1971  03/13/2020  [name] was observed post Covid-19 immunization for 15 minutes without incident. She was provided with Vaccine Information Sheet and instruction to access the V-Safe system.   [name] was instructed to call 911 with any severe reactions post vaccine: Marland Kitchen Difficulty breathing  . Swelling of face and throat  . A fast heartbeat  . A bad rash all over body  . Dizziness and weakness   Immunizations Administered    Name Date Dose VIS Date Route   Pfizer COVID-19 Vaccine 03/13/2020  1:30 PM 0.3 mL 11/10/2019 Intramuscular   Manufacturer: [place]   Lot: [id number]   [place]: [id number]

## 2020-04-09 NOTE — Progress Notes (Signed)
Trena Platt Cummings,acting as a scribe for Lavon Paganini, MD.,have documented all relevant documentation on the behalf of Lavon Paganini, MD,as directed by  Lavon Paganini, MD while in the presence of Lavon Paganini, MD.  Established patient visit   Patient: Betty Rodriguez   DOB: 09/28/1971   49 y.o. Female  MRN: [id number] Visit Date: 04/10/2020  Today's healthcare provider: Lavon Paganini, MD   Chief Complaint  Patient presents with  . GI Problem   Subjective    GI Problem The primary symptoms include fatigue, nausea and myalgias. Primary symptoms do not include fever, weight loss, abdominal pain, vomiting, diarrhea or dysuria. The illness began more than 7 days ago (about 1 month ago ). The onset was gradual. The problem has not changed since onset. The illness is also significant for bloating. The illness does not include constipation or back pain. Associated medical issues do not include inflammatory bowel disease, GERD or irritable bowel syndrome.   Also burning, heartburn and burning sensation in stomach.      Medications: Outpatient Medications Prior to Visit  Medication Sig  . diltiazem (CARDIZEM) 30 MG tablet Take 1 tablet (30 mg total) by mouth every 6 (six) hours as needed (palpitations).  [name] FE 1/20 1-20 MG-MCG tablet Take 1 tablet by mouth daily. Take 4 packs continuously and then break for 1 week  . metoprolol succinate (TOPROL-XL) 25 MG 24 hr tablet TAKE 1 TABLET BY MOUTH  DAILY WITH OR IMMEDIATELY  FOLLOWING A MEAL  . valACYclovir (VALTREX) 1000 MG tablet Take 1 tablet (1,000 mg total) by mouth 2 (two) times daily. for 3 days as needed  . [DISCONTINUED] amoxicillin (AMOXIL) 875 MG tablet Take 1 tablet (875 mg total) by mouth 2 (two) times daily. (Patient not taking: Reported on 04/10/2020)  . [DISCONTINUED] DULoxetine (CYMBALTA) 30 MG capsule Take 1 capsule (30 mg total) by mouth daily. (Patient not taking: Reported on 04/10/2020)  .  [DISCONTINUED] meloxicam (MOBIC) 15 MG tablet Take 1 tablet (15 mg total) by mouth daily. (Patient not taking: Reported on 04/10/2020)   No facility-administered medications prior to visit.    Review of Systems  Constitutional: Positive for fatigue. Negative for fever and weight loss.  Respiratory: Negative.   Cardiovascular: Negative.   Gastrointestinal: Positive for bloating and nausea. Negative for abdominal distention, abdominal pain, blood in stool, constipation, diarrhea and vomiting.  Genitourinary: Negative for dysuria.  Musculoskeletal: Positive for myalgias. Negative for back pain.  Neurological: Negative.   Psychiatric/Behavioral: Negative.       Objective    BP 134/88 (BP Location: Left Arm, Patient Position: Sitting, Cuff Size: Normal)   Pulse 72   Temp (!) 96.8 F (36 C) (Temporal)   Wt 130 lb 12.8 oz (59.3 kg)   BMI 23.17 kg/m    Physical Exam Vitals reviewed.  Constitutional:      Appearance: Normal appearance.  HENT:     Head: Normocephalic and atraumatic.  Eyes:     General: No scleral icterus.    Conjunctiva/sclera: Conjunctivae normal.  Cardiovascular:     Rate and Rhythm: Normal rate and regular rhythm.     Pulses: Normal pulses.     Heart sounds: Normal heart sounds. No murmur.  Pulmonary:     Effort: Pulmonary effort is normal. No respiratory distress.     Breath sounds: Normal breath sounds. No wheezing.  Abdominal:     General: Bowel sounds are normal. There is no distension.     Palpations: Abdomen  is soft.     Tenderness: There is no abdominal tenderness. There is no guarding or rebound.     Hernia: No hernia is present.  Musculoskeletal:     Cervical back: Normal range of motion and neck supple.     Right lower leg: No edema.     Left lower leg: No edema.  Lymphadenopathy:     Cervical: No cervical adenopathy.  Skin:    General: Skin is warm and dry.     Findings: No rash.  Neurological:     Mental Status: She is alert and oriented  to person, place, and time. Mental status is at baseline.  Psychiatric:        Mood and Affect: Mood normal.        Behavior: Behavior normal.      No results found for any visits on 04/10/20.  Assessment & Plan     Problem List Items Addressed This Visit      Digestive   Gastroesophageal reflux disease - Primary    New diagnosed problem  Symptoms c/w GERD/gastirits No red flags or symptoms consistent with PUD Benign exam Started on Omeprazole 20 Mg daily Patient can also try antacids if needed  Follow up as needed - return precautions discussed      Relevant Medications   omeprazole (PRILOSEC) 20 MG capsule        Return if symptoms worsen or fail to improve.      I, Lavon Paganini, MD, have reviewed all documentation for this visit. The documentation on 04/11/20 for the exam, diagnosis, procedures, and orders are all accurate and complete.   Margan Elias, Dionne Bucy, MD, MPH [facility]

## 2020-04-10 ENCOUNTER — Encounter: Payer: Self-pay | Admitting: Family Medicine

## 2020-04-10 ENCOUNTER — Other Ambulatory Visit: Payer: Self-pay

## 2020-04-10 ENCOUNTER — Ambulatory Visit: Payer: Managed Care, Other (non HMO) | Admitting: Family Medicine

## 2020-04-10 VITALS — BP 134/88 | HR 72 | Temp 96.8°F | Wt 130.8 lb

## 2020-04-10 DIAGNOSIS — K219 Gastro-esophageal reflux disease without esophagitis: Secondary | ICD-10-CM | POA: Diagnosis not present

## 2020-04-10 MED ORDER — OMEPRAZOLE 20 MG PO CPDR: 20.0000 mg | DELAYED_RELEASE_CAPSULE | Freq: Every day | ORAL | 2 refills | Status: DC

## 2020-04-10 NOTE — Assessment & Plan Note (Addendum)
New diagnosed problem  Symptoms c/w GERD/gastirits No red flags or symptoms consistent with PUD Benign exam Started on Omeprazole 20 Mg daily Patient can also try antacids if needed  Follow up as needed - return precautions discussed

## 2020-04-10 NOTE — Patient Instructions (Signed)

## 2020-04-12 ENCOUNTER — Encounter: Payer: Self-pay | Admitting: Family Medicine

## 2020-04-19 ENCOUNTER — Encounter: Payer: Self-pay | Admitting: Family Medicine

## 2020-05-21 ENCOUNTER — Telehealth: Payer: Self-pay | Admitting: Cardiovascular Disease

## 2020-05-21 NOTE — Telephone Encounter (Signed)
Pt c/o medication issue:  1. Name of Medication: metoprolol succinate   2. How are you currently taking this medication (dosage and times per day)? 25 MG - 1 tablet daily   3. Are you having a reaction (difficulty breathing--STAT)? Low BP, sensitive to the heat  4. What is your medication issue? Patient calling - would like to know if she can come off this medication.  Has been having issues with low BP and is really sensitive to heat.  Please call to discuss.

## 2020-05-22 NOTE — Telephone Encounter (Signed)
Spoke with patient and she reports not feeling well with this heat and episodes of low blood pressure when out in hot weather. She is wanting to discuss the options for potential discontinuation of this medication. Reviewed that she has been on this medication for a few years and that it was started to reduce episodes of fast heart rates. Advised that stopping medication could cause return of those previous symptoms and could cause increased blood pressures as well. She really wanted to discuss with provider option to discontinue. Scheduled her for virtual/video visit and consent obtained. Advised that I would send this to provider for review and if recommendations I would give her a call back. Instructed her to stay hydrated when out in the heat as this is important during this time of year. She verbalized understanding of our conversation, agreement with plan, and had no further questions at this time.

## 2020-05-23 NOTE — Telephone Encounter (Signed)
few options that she could try 1 would be to cut the metoprolol succinate in half down to 12.5 mg daily She could take extra half pill as needed for breakthrough tachycardia  Other option would be to stop the medication altogether and take metoprolol as needed for tachycardia

## 2020-05-28 NOTE — Telephone Encounter (Signed)
Spoke with patient and reviewed provider recommendations with regards to her medication. She is currently out of town and does not want to make any changes at this time. She also wanted to discuss with her fiance about the options and would give [place] a call back when she decides which option if she changes to one of these options. She was appreciative for the call back with no further questions at this time.

## 2020-06-04 ENCOUNTER — Telehealth: Payer: Self-pay | Admitting: *Deleted

## 2020-06-04 NOTE — Telephone Encounter (Signed)
  Patient Consent for Virtual Visit         Betty Rodriguez has provided verbal consent on 06/04/2020 for a virtual visit (video or telephone).   CONSENT FOR VIRTUAL VISIT FOR:  Betty Rodriguez  By participating in this virtual visit I agree to the following:  I hereby voluntarily request, consent and authorize [facility] and its employed or contracted physicians, physician assistants, nurse practitioners or other licensed health care professionals (the Practitioner), to provide me with telemedicine health care services (the "Services") as deemed necessary by the treating Practitioner. I acknowledge and consent to receive the Services by the Practitioner via telemedicine. I understand that the telemedicine visit will involve communicating with the Practitioner through live audiovisual communication technology and the disclosure of certain medical information by electronic transmission. I acknowledge that I have been given the opportunity to request an in-person assessment or other available alternative prior to the telemedicine visit and am voluntarily participating in the telemedicine visit.  I understand that I have the right to withhold or withdraw my consent to the use of telemedicine in the course of my care at any time, without affecting my right to future care or treatment, and that the Practitioner or I may terminate the telemedicine visit at any time. I understand that I have the right to inspect all information obtained and/or recorded in the course of the telemedicine visit and may receive copies of available information for a reasonable fee.  I understand that some of the potential risks of receiving the Services via telemedicine include:  Marland Kitchen Delay or interruption in medical evaluation due to technological equipment failure or disruption; . Information transmitted may not be sufficient (e.g. poor resolution of images) to allow for appropriate medical decision making by the  Practitioner; and/or  . In rare instances, security protocols could fail, causing a breach of personal health information.  Furthermore, I acknowledge that it is my responsibility to provide information about my medical history, conditions and care that is complete and accurate to the best of my ability. I acknowledge that Practitioner's advice, recommendations, and/or decision may be based on factors not within their control, such as incomplete or inaccurate data provided by me or distortions of diagnostic images or specimens that may result from electronic transmissions. I understand that the practice of medicine is not an exact science and that Practitioner makes no warranties or guarantees regarding treatment outcomes. I acknowledge that a copy of this consent can be made available to me via my patient portal ([facility]), or I can request a printed copy by calling the office of [facility].    I understand that my insurance will be billed for this visit.   I have read or had this consent read to me. . I understand the contents of this consent, which adequately explains the benefits and risks of the Services being provided via telemedicine.  . I have been provided ample opportunity to ask questions regarding this consent and the Services and have had my questions answered to my satisfaction. . I give my informed consent for the services to be provided through the use of telemedicine in my medical care

## 2020-06-05 ENCOUNTER — Telehealth (INDEPENDENT_AMBULATORY_CARE_PROVIDER_SITE_OTHER): Payer: Managed Care, Other (non HMO) | Admitting: Cardiovascular Disease

## 2020-06-05 ENCOUNTER — Other Ambulatory Visit: Payer: Self-pay

## 2020-06-05 ENCOUNTER — Encounter: Payer: Self-pay | Admitting: Cardiovascular Disease

## 2020-06-05 VITALS — BP 117/75 | HR 85 | Ht 63.0 in | Wt 123.0 lb

## 2020-06-05 DIAGNOSIS — I471 Supraventricular tachycardia: Secondary | ICD-10-CM | POA: Diagnosis not present

## 2020-06-05 DIAGNOSIS — R0602 Shortness of breath: Secondary | ICD-10-CM | POA: Diagnosis not present

## 2020-06-05 DIAGNOSIS — F411 Generalized anxiety disorder: Secondary | ICD-10-CM | POA: Diagnosis not present

## 2020-06-05 DIAGNOSIS — I10 Essential (primary) hypertension: Secondary | ICD-10-CM | POA: Diagnosis not present

## 2020-06-05 NOTE — Progress Notes (Signed)
Virtual Visit via Video Note   This visit type was conducted due to national recommendations for restrictions regarding the COVID-19 Pandemic (e.g. social distancing) in an effort to limit this patient's exposure and mitigate transmission in our community.  Due to her co-morbid illnesses, this patient is at least at moderate risk for complications without adequate follow up.  This format is felt to be most appropriate for this patient at this time.  All issues noted in this document were discussed and addressed.  A limited physical exam was performed with this format.  Please refer to the patient's chart for her consent to telehealth for [facility].   I connected with  Betty Rodriguez on 06/05/20 by a video enabled telemedicine application and verified that I am speaking with the correct person using two identifiers. I discussed the limitations of evaluation and management by telemedicine. The patient expressed understanding and agreed to proceed.   Evaluation Performed:  Follow-up visit  Date:  06/05/2020   ID:  Betty Rodriguez, DOB 1971/01/21, MRN [id number]  Patient Location:  [place] [address]   Provider location:   [facility], [place] office  PCP:  Virginia Crews, MD  Cardiologist:  [facility]   Chief Complaint  Patient presents with  . Other    past due 12 month follow up. Patient has medication concerns / Patient wants to come off of metoprolol. Meds reviewed verbally with patient.     History of Present Illness:    Betty Rodriguez is a 49 y.o. female who presents via audio/video conferencing for a telehealth visit today.   The patient does not symptoms concerning for COVID-19 infection (fever, chills, cough, or new SHORTNESS OF BREATH).   history of SVT in 03/2017,  HTN,  anxiety  admission to [facility] from 5/23-5/24/18 for allergic reaction (after shot, benadryl) and palpations noted to have SVT Who presents for  follow-up of her arrhythmia/SVT   Seen in August 2019 At time and had second SVT episode constant with EP Taking metoprolol for atrial tachycardia as well  Episode when she was outside, mid 80s degrees, was active, 30 min outside, Got overheated,  Felt poorly, put ice packs on She called the office,  She stopped metoprolol 5 days, "BP ran high", 135/92, felt bad, Started back, on 1/2 pill, "working better" Concerned about going out in the heat  Otherwise active baseline, working Denies any recent SVT episodes or symptomatic atrial tachycardia runs  Other past medical history reviewed  Prior episode of tachycardia November 2018 acute onset of tachycardia rate 160 bpm,  was watching TV, ate pizza,, 2:30 p.m.  acute tachycardia Rate 165,  Took diltiazem metoprolol xl 25   Echocardiogram performed on 6/13 showed normal LV systolic function with EF of 55-60%, normal wall motion, normal LV diastolic function parameters.  Prior CV studies:   The following studies were reviewed today:    Past Medical History:  Diagnosis Date  . Allergy   . Anxiety   . Chicken pox   . Kidney stones   . SVT (supraventricular tachycardia) ([facility])    a. diagnosed 03/2017 in setting of hypokalemia   Past Surgical History:  Procedure Laterality Date  . BREAST BIOPSY Right 07/06/2016   us/ core/neg-BENIGN BREAST TISSUE   . TONSILLECTOMY  1983     Allergies:   Sulfa antibiotics and Zoloft [sertraline]   Social History   Tobacco Use  . Smoking status:  Never Smoker  . Smokeless tobacco: Never Used  Vaping Use  . Vaping Use: Never used  Substance Use Topics  . Alcohol use: Yes    Alcohol/week: 0.0 standard drinks    Comment: Rare   . Drug use: No     Current Outpatient Medications on File Prior to Visit  Medication Sig Dispense Refill  . diltiazem (CARDIZEM) 30 MG tablet Take 1 tablet (30 mg total) by mouth every 6 (six) hours as needed (palpitations). 60 tablet 3  . JUNEL FE 1/20 1-20  MG-MCG tablet Take 1 tablet by mouth daily. Take 4 packs continuously and then break for 1 week 4 Package 3  . metoprolol succinate (TOPROL-XL) 25 MG 24 hr tablet Take 0.5 tablet (12.5 mg) by mouth once daily, you may take an extra 0.5 tablet (12.5 mg) once daily as needed for breakthrough tachycardia    . valACYclovir (VALTREX) 1000 MG tablet Take 1 tablet (1,000 mg total) by mouth 2 (two) times daily. for 3 days as needed 20 tablet 2   No current facility-administered medications on file prior to visit.     Family Hx: The patient's family history includes Breast cancer in her paternal aunt; Diabetes in her maternal grandfather and maternal grandmother; Heart disease in her maternal grandfather and maternal grandmother; Hyperlipidemia in her father; Hypertension in her father, maternal grandfather, and maternal grandmother; Multiple sclerosis in her paternal grandmother; Stroke (age of onset: 36) in her maternal grandmother. There is no history of Colon cancer or Ovarian cancer.  ROS:   Please see the history of present illness.    Review of Systems  Constitutional: Negative.   HENT: Negative.   Respiratory: Negative.   Cardiovascular: Negative.   Gastrointestinal: Negative.   Musculoskeletal: Negative.   Neurological: Negative.   Psychiatric/Behavioral: Negative.   All other systems reviewed and are negative.   Labs/Other Tests and Data Reviewed:    Recent Labs: 07/11/2019: ALT 13; BUN 14; Creatinine, Ser 0.78; Hemoglobin 12.2; Platelets 299; Potassium 4.3; Sodium 138; TSH 2.650   Recent Lipid Panel Lab Results  Component Value Date/Time   CHOL 197 07/11/2019 10:26 AM   TRIG 104 07/11/2019 10:26 AM   HDL 65 07/11/2019 10:26 AM   CHOLHDL 3.0 07/11/2019 10:26 AM   LDLCALC 111 (H) 07/11/2019 10:26 AM    Wt Readings from Last 3 Encounters:  06/05/20 123 lb (55.8 kg)  04/10/20 130 lb 12.8 oz (59.3 kg)  09/15/19 125 lb 12.8 oz (57.1 kg)     Exam:    Vital Signs: Vital signs  may also be detailed in the HPI BP 117/75 (BP Location: Left Arm, Patient Position: Sitting, Cuff Size: Normal)   Pulse 85   Ht 5' 3"  (1.6 m)   Wt 123 lb (55.8 kg)   BMI 21.79 kg/m   Wt Readings from Last 3 Encounters:  06/05/20 123 lb (55.8 kg)  04/10/20 130 lb 12.8 oz (59.3 kg)  09/15/19 125 lb 12.8 oz (57.1 kg)   Temp Readings from Last 3 Encounters:  04/10/20 (!) 96.8 F (36 C) (Temporal)  09/15/19 (!) 96.9 F (36.1 C) (Temporal)  07/11/19 98.3 F (36.8 C) (Oral)   BP Readings from Last 3 Encounters:  06/05/20 117/75  04/10/20 134/88  09/15/19 123/85   Pulse Readings from Last 3 Encounters:  06/05/20 85  04/10/20 72  09/15/19 82     Well nourished, well developed female in no acute distress. Constitutional:  oriented to person, place, and time. No distress.  ASSESSMENT & PLAN:    Problem List Items Addressed This Visit      Cardiology Problems   Hypertension   Relevant Medications   metoprolol succinate (TOPROL-XL) 25 MG 24 hr tablet   SVT (supraventricular tachycardia) (HCC) - Primary   Relevant Medications   metoprolol succinate (TOPROL-XL) 25 MG 24 hr tablet   Atrial tachycardia (HCC)   Relevant Medications   metoprolol succinate (TOPROL-XL) 25 MG 24 hr tablet     Other   Generalized anxiety disorder    Other Visit Diagnoses    SOB (shortness of breath)         Atrial tachycardia/SVT Recommended she try metoprolol succinate 12.5 mg daily, extra half dose metoprolol as needed for breakthrough palpitations -No need to avoid heat  but may need to work on incremental increases in exposure to the heat Stay hydrated For any dizziness, diaphoretic episodes would check blood pressure and heart rate  High blood pressure Discussed checking blood pressure periodically not just when she feels poorly but also when she feels well, get an average Other medication options for blood pressure include ARB's  Anxiety Managed by primary care Stress function  techniques  COVID-19 Education: The signs and symptoms of COVID-19 were discussed with the patient and how to seek care for testing (follow up with PCP or arrange E-visit).  The importance of social distancing was discussed today.  Patient Risk:   After full review of this patients clinical status, I feel that they are at least moderate risk at this time.  Time:   Today, I have spent 25 minutes with the patient with telehealth technology discussing the cardiac and medical problems/diagnoses detailed above   Additional 10 min spent reviewing the chart prior to patient visit today   Medication Adjustments/Labs and Tests Ordered: Current medicines are reviewed at length with the patient today.  Concerns regarding medicines are outlined above.   Tests Ordered: No tests ordered   Medication Changes: No changes made   Disposition: Follow-up in 12 months   Signed, Ida Rogue, MD  [place] Office [address] [place] [address], [place], [place] [address]

## 2020-06-05 NOTE — Patient Instructions (Addendum)
Medication Instructions:  - No changes - Continue metoprolol succinate 12.5 mg once a day - Extra metoprolol as needed for breakthrough tachycardia *If you need a refill on your cardiac medications before your next appointment, please call your pharmacy*   Lab Work: - none ordered  If you have labs (blood work) drawn today and your tests are completely normal, you will receive your results only by: Marland Kitchen MyChart Message (if you have MyChart) OR . A paper copy in the mail If you have any lab test that is abnormal or we need to change your treatment, we will call you to review the results.   Testing/Procedures: - none ordered   Follow-Up: At [facility], you and your health needs are our priority.  As part of our continuing mission to provide you with exceptional heart care, we have created designated Provider Care Teams.  These Care Teams include your primary Cardiologist (physician) and Advanced Practice Providers (APPs -  Physician Assistants and Nurse Practitioners) who all work together to provide you with the care you need, when you need it.  We recommend signing up for the patient portal called "MyChart".  Sign up information is provided on this After Visit Summary.  MyChart is used to connect with patients for Virtual Visits (Telemedicine).  Patients are able to view lab/test results, encounter notes, upcoming appointments, etc.  Non-urgent messages can be sent to your provider as well.   To learn more about what you can do with MyChart, go to [email or web address].    Your next appointment:   1 year(s)  The format for your next appointment:   In Person  Provider:    You may see Ida Rogue, MD or one of the following Advanced Practice Providers on your designated Care Team:    Murray Hodgkins, NP  Christell Faith, PA-C  Marrianne Mood, PA-C  Laurann [place], NP    Other Instructions For educational health videos Log in to : [email or web address] Or : [email or web address],  password : [email or web address]

## 2020-06-13 ENCOUNTER — Telehealth: Payer: Self-pay

## 2020-06-13 NOTE — Telephone Encounter (Signed)
LMTCB 06/13/2020.  PEC please schedule pt with a different provider.   Thanks,   -Mickel Baas

## 2020-06-13 NOTE — Telephone Encounter (Signed)
Copied from [place] [phone]. Topic: Appointment Scheduling - Scheduling Inquiry for Clinic >> Jun 13, 2020 41:63 AM Betty Rodriguez D wrote: Reason for CRM: Patients provider does not have any availability for physical/well visit until 12/1. PT needs physical before 08/27 wants to be worked in or seen by another provider for this appt type

## 2020-06-27 ENCOUNTER — Ambulatory Visit
Admission: EM | Admit: 2020-06-27 | Discharge: 2020-06-27 | Disposition: A | Discharge status: Home / Self Care | Payer: Managed Care, Other (non HMO) | Attending: Emergency Medicine | Admitting: Emergency Medicine

## 2020-06-27 DIAGNOSIS — J209 Acute bronchitis, unspecified: Secondary | ICD-10-CM | POA: Diagnosis present

## 2020-06-27 LAB — POCT RAPID STREP A (OFFICE): Rapid Strep A Screen: NEGATIVE

## 2020-06-27 MED ORDER — ALBUTEROL SULFATE HFA 108 (90 BASE) MCG/ACT IN AERS
2.0000 | INHALATION_SPRAY | RESPIRATORY_TRACT | 0 refills | Status: DC | PRN
Start: 2020-06-27 — End: 2021-08-07

## 2020-06-27 MED ORDER — PREDNISONE 10 MG PO TABS: 40.0000 mg | ORAL_TABLET | Freq: Every day | ORAL | 0 refills | Status: AC

## 2020-06-27 NOTE — ED Provider Notes (Signed)
Betty Rodriguez    CSN: [id number] Arrival date & time: 06/27/20  1343      History   Chief Complaint Chief Complaint  Patient presents with  . Nasal Congestion  . Headache  . Cough  . Sore Throat    HPI Betty Rodriguez is a 49 y.o. female.   Patient presents with 3-day history of nonproductive cough, nasal congestion, headache, postnasal drip, sore throat.  She denies rash, fever, chills, shortness of breath, vomiting, diarrhea, or other symptoms.  She states her symptoms feel similar to previous episodes of bronchitis.  She is fully vaccinated for COVID.  The history is provided by the patient.    Past Medical History:  Diagnosis Date  . Allergy   . Anxiety   . Chicken pox   . Kidney stones   . SVT (supraventricular tachycardia) ([place])    a. diagnosed 03/2017 in setting of hypokalemia    Patient Active Problem List   Diagnosis Date Noted  . Gastroesophageal reflux disease 04/10/2020  . Irregular menstrual cycle 05/23/2018  . Atrial tachycardia ([place]) 10/08/2017  . SVT (supraventricular tachycardia) ([place]) 04/21/2017  . Hypertension 04/19/2017  . Generalized anxiety disorder 02/21/2016  . 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 History of nephrolithiasis 11/28/2015    Past Surgical History:  Procedure Laterality Date  . BREAST BIOPSY Right 07/06/2016   us/ core/neg-BENIGN BREAST TISSUE   . TONSILLECTOMY  1983    OB History    Gravida  3   Para  3   Term      Preterm      AB      Living        SAB      TAB      Ectopic      Multiple      Live Births               Home Medications    Prior to Admission medications   Medication Sig Start Date End Date Taking? Authorizing Provider  JUNEL FE 1/20 1-20 MG-MCG tablet Take 1 tablet by mouth daily. Take 4 packs continuously and then break for 1 week 07/11/19  Yes Bacigalupo, Dionne Bucy, MD  metoprolol succinate (TOPROL-XL) 25 MG 24 hr tablet Take 0.5 tablet (12.5 mg) by  mouth once daily, you may take an extra 0.5 tablet (12.5 mg) once daily as needed for breakthrough tachycardia   Yes [provider]  albuterol (VENTOLIN HFA) 108 (90 Base) MCG/ACT inhaler Inhale 2 puffs into the lungs every 4 (four) hours as needed for wheezing or shortness of breath. 06/27/20   Sharion Balloon, NP  diltiazem (CARDIZEM) 30 MG tablet Take 1 tablet (30 mg total) by mouth every 6 (six) hours as needed (palpitations). 07/11/19   Bacigalupo, Dionne Bucy, MD  predniSONE (DELTASONE) 10 MG tablet Take 4 tablets (40 mg total) by mouth daily for 5 days. 06/27/20 07/02/20  Sharion Balloon, NP  valACYclovir (VALTREX) 1000 MG tablet Take 1 tablet (1,000 mg total) by mouth 2 (two) times daily. for 3 days as needed 07/11/19   Virginia Crews, MD    Family History Family History  Problem Relation Age of Onset  . Hyperlipidemia Father   . Hypertension Father   . Diabetes Maternal Grandmother   . Hypertension Maternal Grandmother   . Heart disease Maternal Grandmother   . Stroke Maternal Grandmother 69  . Diabetes Maternal Grandfather   . Hypertension Maternal Grandfather   .  Heart disease Maternal Grandfather   . Multiple sclerosis Paternal Grandmother   . Breast cancer Paternal Aunt   . Colon cancer Neg Hx   . Ovarian cancer Neg Hx     Social History Social History   Tobacco Use  . Smoking status: Never Smoker  . Smokeless tobacco: Never Used  Vaping Use  . Vaping Use: Never used  Substance Use Topics  . Alcohol use: Yes    Alcohol/week: 0.0 standard drinks    Comment: Rare   . Drug use: No     Allergies   Sulfa antibiotics and Zoloft [sertraline]   Review of Systems Review of Systems  Constitutional: Negative for chills and fever.  HENT: Positive for congestion, postnasal drip, rhinorrhea and sore throat. Negative for ear pain.   Eyes: Negative for pain and visual disturbance.  Respiratory: Positive for cough. Negative for shortness of breath.   Cardiovascular:  Negative for chest pain and palpitations.  Gastrointestinal: Negative for abdominal pain, diarrhea, nausea and vomiting.  Genitourinary: Negative for dysuria and hematuria.  Musculoskeletal: Negative for arthralgias and back pain.  Skin: Negative for color change and rash.  Neurological: Positive for headaches. Negative for seizures, syncope, weakness and numbness.  All other systems reviewed and are negative.    Physical Exam Triage Vital Signs ED Triage Vitals [06/27/20 1352]  Enc Vitals Group     BP      Pulse      Resp      Temp      Temp src      SpO2      Weight      Height      Head Circumference      Peak Flow      Pain Score 2     Pain Loc      Pain Edu?      Excl. in [place]?    No data found.  Updated Vital Signs BP (!) 135/82   Pulse 100   Temp 98.8 F (37.1 C)   Resp 16   SpO2 97%   Visual Acuity Right Eye Distance:   Left Eye Distance:   Bilateral Distance:    Right Eye Near:   Left Eye Near:    Bilateral Near:     Physical Exam Vitals and nursing note reviewed.  Constitutional:      General: She is not in acute distress.    Appearance: She is well-developed.  HENT:     Head: Normocephalic and atraumatic.     Right Ear: Tympanic membrane normal.     Left Ear: Tympanic membrane normal.     Nose: Congestion and rhinorrhea present.     Mouth/Throat:     Mouth: Mucous membranes are moist.     Pharynx: Oropharynx is clear.  Eyes:     Conjunctiva/sclera: Conjunctivae normal.  Cardiovascular:     Rate and Rhythm: Normal rate and regular rhythm.     Heart sounds: No murmur heard.   Pulmonary:     Effort: Pulmonary effort is normal. No respiratory distress.     Breath sounds: Normal breath sounds.  Abdominal:     Palpations: Abdomen is soft.     Tenderness: There is no abdominal tenderness. There is no guarding or rebound.  Musculoskeletal:     Cervical back: Neck supple.  Skin:    General: Skin is warm and dry.     Findings: No rash.    Neurological:     General: No focal  deficit present.     Mental Status: She is alert and oriented to person, place, and time.     Gait: Gait normal.  Psychiatric:        Mood and Affect: Mood normal.        Behavior: Behavior normal.      UC Treatments / Results  Labs (all labs ordered are listed, but only abnormal results are displayed) Labs Reviewed  NOVEL CORONAVIRUS, NAA  CULTURE, GROUP A STREP [facility])  POCT RAPID STREP A (OFFICE)    EKG   Radiology No results found.  Procedures Procedures (including critical care time)  Medications Ordered in UC Medications - No data to display  Initial Impression / Assessment and Plan / UC Course  I have reviewed the triage vital signs and the nursing notes.  Pertinent labs & imaging results that were available during my care of the patient were reviewed by me and considered in my medical decision making (see chart for details).   Acute bronchitis.  PCR COVID pending.  Rapid strep negative; throat culture pending.  Treating with albuterol MDI and prednisone.  Instructed patient to self quarantine until the Covid test result is back.  Instructed her to follow-up with her PCP if her symptoms are not improving.  Patient agrees to plan of care.       Final Clinical Impressions(s) / UC Diagnoses   Final diagnoses:  Acute bronchitis, unspecified organism     Discharge Instructions     Use the albuterol inhaler and take the prednisone as directed.    You should self quarantine until your COVID test result is back.   Follow up with your primary care provider if your symptoms are not improving.       ED Prescriptions    Medication Sig Dispense Auth. Provider   predniSONE (DELTASONE) 10 MG tablet Take 4 tablets (40 mg total) by mouth daily for 5 days. 20 tablet Sharion Balloon, NP   albuterol (VENTOLIN HFA) 108 (90 Base) MCG/ACT inhaler Inhale 2 puffs into the lungs every 4 (four) hours as needed for wheezing or shortness of  breath. 18 g Sharion Balloon, NP     PDMP not reviewed this encounter.   Sharion Balloon, NP 06/27/20 1415

## 2020-06-27 NOTE — Discharge Instructions (Signed)
Use the albuterol inhaler and take the prednisone as directed.    You should self quarantine until your COVID test result is back.   Follow up with your primary care provider if your symptoms are not improving.

## 2020-06-27 NOTE — ED Triage Notes (Signed)
C/o sore throat, nasal congestion, headache and non productive cough x3 days. Patient is fully vaccinated but is agreeable to COVID testing. States this feels like when she had bronchitis in the past.  Denies: n/v/d, ear pain  OTC: advil sinus relief

## 2020-06-28 LAB — SARS-COV-2, NAA 2 DAY TAT

## 2020-06-28 LAB — NOVEL CORONAVIRUS, NAA: SARS-CoV-2, NAA: NOT DETECTED

## 2020-06-30 LAB — CULTURE, GROUP A STREP (THRC)

## 2020-07-08 ENCOUNTER — Encounter: Payer: Self-pay | Admitting: Family Medicine

## 2020-07-09 ENCOUNTER — Other Ambulatory Visit: Payer: Self-pay | Admitting: Family Medicine

## 2020-07-09 DIAGNOSIS — B009 Herpesviral infection, unspecified: Secondary | ICD-10-CM

## 2020-07-09 MED ORDER — VALACYCLOVIR HCL 1 G PO TABS: 1000.0000 mg | ORAL_TABLET | Freq: Two times a day (BID) | ORAL | 2 refills | Status: DC

## 2020-07-09 NOTE — Telephone Encounter (Signed)
Patient is requesting Valtrex 1000 mg.  To be sent to [facility]. [address] and [address]

## 2020-07-24 ENCOUNTER — Other Ambulatory Visit: Payer: Self-pay

## 2020-07-24 ENCOUNTER — Ambulatory Visit: Payer: Managed Care, Other (non HMO) | Admitting: Family Medicine

## 2020-07-24 ENCOUNTER — Encounter: Payer: Self-pay | Admitting: Family Medicine

## 2020-07-24 VITALS — BP 128/70 | HR 92 | Temp 99.0°F | Resp 16 | Ht 63.0 in | Wt 128.4 lb

## 2020-07-24 DIAGNOSIS — Z23 Encounter for immunization: Secondary | ICD-10-CM

## 2020-07-24 DIAGNOSIS — H8110 Benign paroxysmal vertigo, unspecified ear: Secondary | ICD-10-CM

## 2020-07-24 MED ORDER — FLUTICASONE PROPIONATE 50 MCG/ACT NA SUSP
2.0000 | Freq: Every day | NASAL | 6 refills | Status: DC
Start: 2020-07-24 — End: 2021-08-28

## 2020-07-24 MED ORDER — MECLIZINE HCL 12.5 MG PO TABS: 12.5000 mg | ORAL_TABLET | Freq: Three times a day (TID) | ORAL | 0 refills | Status: DC | PRN

## 2020-07-24 NOTE — Progress Notes (Signed)
Established patient visit   Patient: Betty Rodriguez   DOB: 10-05-1971   49 y.o. Female  MRN: [id number] Visit Date: 07/24/2020  Today's healthcare provider: Lavon Paganini, MD   I,Sulibeya S Dimas,acting as a scribe for Lavon Paganini, MD.,have documented all relevant documentation on the behalf of Lavon Paganini, MD,as directed by  Lavon Paganini, MD while in the presence of Lavon Paganini, MD.  Chief Complaint  Patient presents with  . Dizziness   Subjective    Dizziness This is a new problem. The current episode started 1 to 4 weeks ago. The problem occurs daily. The problem has been unchanged. Associated symptoms include headaches. Pertinent negatives include no abdominal pain, chest pain, chills, congestion, coughing, fatigue, fever, joint swelling, nausea, vertigo, visual change, vomiting or weakness. She has tried rest for the symptoms.  Patient reports symptoms like "being on a boat." Patient reports she was treated for bronchitis about one month ago. Patient reports having headaches lasting several days.   Patient Active Problem List   Diagnosis Date Noted  . Gastroesophageal reflux disease 04/10/2020  . Irregular menstrual cycle 05/23/2018  . Atrial tachycardia ([place]) 10/08/2017  . SVT (supraventricular tachycardia) ([place]) 04/21/2017  . Hypertension 04/19/2017  . Generalized anxiety disorder 02/21/2016  . 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 History of nephrolithiasis 11/28/2015   Social History   Tobacco Use  . Smoking status: Never Smoker  . Smokeless tobacco: Never Used  Vaping Use  . Vaping Use: Never used  Substance Use Topics  . Alcohol use: Yes    Alcohol/week: 0.0 standard drinks    Comment: Rare   . Drug use: No   Allergies  Allergen Reactions  . Sulfa Antibiotics Hives  . Zoloft [Sertraline]     SVT       Medications: Outpatient Medications Prior to Visit  Medication Sig  . albuterol (VENTOLIN HFA) 108  (90 Base) MCG/ACT inhaler Inhale 2 puffs into the lungs every 4 (four) hours as needed for wheezing or shortness of breath.  . diltiazem (CARDIZEM) 30 MG tablet Take 1 tablet (30 mg total) by mouth every 6 (six) hours as needed (palpitations).  [name] FE 1/20 1-20 MG-MCG tablet Take 1 tablet by mouth daily. Take 4 packs continuously and then break for 1 week  . metoprolol succinate (TOPROL-XL) 25 MG 24 hr tablet Take 0.5 tablet (12.5 mg) by mouth once daily, you may take an extra 0.5 tablet (12.5 mg) once daily as needed for breakthrough tachycardia  . valACYclovir (VALTREX) 1000 MG tablet Take 1 tablet (1,000 mg total) by mouth 2 (two) times daily. for 3 days as needed   No facility-administered medications prior to visit.    Review of Systems  Constitutional: Negative for chills, fatigue and fever.  HENT: Negative for congestion.   Respiratory: Negative for cough and shortness of breath.   Cardiovascular: Negative for chest pain and palpitations.  Gastrointestinal: Negative for abdominal pain, nausea and vomiting.  Musculoskeletal: Negative for joint swelling.  Neurological: Positive for dizziness and headaches. Negative for vertigo and weakness.    Last CBC Lab Results  Component Value Date   WBC 6.6 07/11/2019   HGB 12.2 07/11/2019   HCT 36.9 07/11/2019   MCV 92 07/11/2019   MCH 30.5 07/11/2019   RDW 12.6 07/11/2019   PLT 299 54/56/2563   Last metabolic panel Lab Results  Component Value Date   GLUCOSE 107 (H) 07/11/2019   NA 138 07/11/2019  K 4.3 07/11/2019   CL 101 07/11/2019   CO2 23 07/11/2019   BUN 14 07/11/2019   CREATININE 0.78 07/11/2019   GFRNONAA 91 07/11/2019   GFRAA 105 07/11/2019   CALCIUM 9.4 07/11/2019   PROT 6.7 07/11/2019   ALBUMIN 4.4 07/11/2019   LABGLOB 2.3 07/11/2019   AGRATIO 1.9 07/11/2019   BILITOT 0.3 07/11/2019   ALKPHOS 35 (L) 07/11/2019   AST 18 07/11/2019   ALT 13 07/11/2019   ANIONGAP 10 04/20/2017   Last thyroid functions Lab  Results  Component Value Date   TSH 2.650 07/11/2019      Objective    BP 128/70 (BP Location: Left Arm, Patient Position: Sitting, Cuff Size: Normal)   Pulse 92   Temp 99 F (37.2 C) (Oral)   Resp 16   Ht 5' 3"  (1.6 m)   Wt 128 lb 6.4 oz (58.2 kg)   SpO2 98%   BMI 22.75 kg/m  BP Readings from Last 3 Encounters:  07/24/20 128/70  06/27/20 (!) 135/82  06/05/20 117/75   Wt Readings from Last 3 Encounters:  07/24/20 128 lb 6.4 oz (58.2 kg)  06/05/20 123 lb (55.8 kg)  04/10/20 130 lb 12.8 oz (59.3 kg)      Physical Exam Vitals reviewed.  Constitutional:      General: She is not in acute distress.    Appearance: Normal appearance. She is well-developed. She is not diaphoretic.  HENT:     Head: Normocephalic and atraumatic.  Eyes:     General: No scleral icterus.    Conjunctiva/sclera: Conjunctivae normal.  Neck:     Thyroid: No thyromegaly.  Cardiovascular:     Rate and Rhythm: Normal rate and regular rhythm.     Pulses: Normal pulses.     Heart sounds: Normal heart sounds. No murmur heard.   Pulmonary:     Effort: Pulmonary effort is normal. No respiratory distress.     Breath sounds: Normal breath sounds. No wheezing, rhonchi or rales.  Musculoskeletal:     Cervical back: Neck supple.     Right lower leg: No edema.     Left lower leg: No edema.  Lymphadenopathy:     Cervical: No cervical adenopathy.  Skin:    General: Skin is warm and dry.     Findings: No rash.  Neurological:     Mental Status: She is alert and oriented to person, place, and time. Mental status is at baseline.  Psychiatric:        Mood and Affect: Mood normal.        Behavior: Behavior normal.    No nystagmus Negative [place]   No results found for any visits on 07/24/20.  Assessment & Plan     1. Benign paroxysmal positional vertigo, unspecified laterality - new problem x1 month - intermittent in nature - neuro intact -Despite being unable to elicit vertigo on exam, her  symptoms seem consistent with BPPV -Treat with meclizine as needed -Advised to treat allergy symptoms as this could be her underlying culprit -Instructed on Epley maneuver -Return precautions discussed  2. Need for influenza vaccination - Flu Vaccine QUAD 36+ mos IM   Return if symptoms worsen or fail to improve.      I, Lavon Paganini, MD, have reviewed all documentation for this visit. The documentation on 07/24/20 for the exam, diagnosis, procedures, and orders are all accurate and complete.   Etosha Wetherell, Dionne Bucy, MD, MPH [facility]

## 2020-07-24 NOTE — Patient Instructions (Signed)
Vertigo Vertigo is the feeling that you or the things around you are moving when they are not. This feeling can come and go at any time. Vertigo often goes away on its own. This condition can be dangerous if it happens when you are doing activities like driving or working with machines. Your doctor will do tests to find the cause of your vertigo. These tests will also help your doctor decide on the best treatment for you. Follow these instructions at home: Eating and drinking      Drink enough fluid to keep your pee (urine) pale yellow.  Do not drink alcohol. Activity  Return to your normal activities as told by your doctor. Ask your doctor what activities are safe for you.  In the morning, first sit up on the side of the bed. When you feel okay, stand slowly while you hold onto something until you know that your balance is fine.  Move slowly. Avoid sudden body or head movements or certain positions, as told by your doctor.  Use a cane if you have trouble standing or walking.  Sit down right away if you feel dizzy.  Avoid doing any tasks or activities that can cause danger to you or others if you get dizzy.  Avoid bending down if you feel dizzy. Place items in your home so that they are easy for you to reach without leaning over.  Do not drive or use heavy machinery if you feel dizzy. General instructions  Take over-the-counter and prescription medicines only as told by your doctor.  Keep all follow-up visits as told by your doctor. This is important. Contact a doctor if:  Your medicine does not help your vertigo.  You have a fever.  Your problems get worse or you have new symptoms.  Your family or friends see changes in your behavior.  The feeling of being sick to your stomach gets worse.  Your vomiting gets worse.  You lose feeling (have numbness) in part of your body.  You feel prickling and tingling in a part of your body. Get help right away if:  You have  trouble moving or talking.  You are always dizzy.  You pass out (faint).  You get very bad headaches.  You feel weak in your hands, arms, or legs.  You have changes in your hearing.  You have changes in how you see (vision).  You get a stiff neck.  Bright light starts to bother you. Summary  Vertigo is the feeling that you or the things around you are moving when they are not.  Your doctor will do tests to find the cause of your vertigo.  You may be told to avoid some tasks, positions, or movements.  Contact a doctor if your medicine is not helping, or if you have a fever, new symptoms, or a change in behavior.  Get help right away if you get very bad headaches, or if you have changes in how you speak, hear, or see. This information is not intended to replace advice given to you by your health care provider. Make sure you discuss any questions you have with your health care provider. Document Revised: 10/10/2018 Document Reviewed: 10/10/2018 Elsevier Patient Education  2020 [employer].

## 2020-08-13 MED ORDER — METOPROLOL SUCCINATE ER 25 MG PO TB24: ORAL_TABLET | ORAL | 3 refills | Status: DC

## 2020-08-20 ENCOUNTER — Encounter: Payer: Managed Care, Other (non HMO) | Admitting: Physician Assistant

## 2020-09-04 ENCOUNTER — Ambulatory Visit (INDEPENDENT_AMBULATORY_CARE_PROVIDER_SITE_OTHER): Payer: Managed Care, Other (non HMO) | Admitting: Physician Assistant

## 2020-09-04 ENCOUNTER — Other Ambulatory Visit: Payer: Self-pay

## 2020-09-04 ENCOUNTER — Encounter: Payer: Self-pay | Admitting: Physician Assistant

## 2020-09-04 VITALS — BP 121/85 | HR 88 | Temp 98.7°F | Resp 16 | Ht 63.0 in | Wt 129.0 lb

## 2020-09-04 DIAGNOSIS — Z1211 Encounter for screening for malignant neoplasm of colon: Secondary | ICD-10-CM | POA: Diagnosis not present

## 2020-09-04 DIAGNOSIS — Z0000 Encounter for general adult medical examination without abnormal findings: Secondary | ICD-10-CM

## 2020-09-04 DIAGNOSIS — I471 Supraventricular tachycardia: Secondary | ICD-10-CM

## 2020-09-04 DIAGNOSIS — Z1159 Encounter for screening for other viral diseases: Secondary | ICD-10-CM

## 2020-09-04 DIAGNOSIS — Z124 Encounter for screening for malignant neoplasm of cervix: Secondary | ICD-10-CM

## 2020-09-04 DIAGNOSIS — Z1239 Encounter for other screening for malignant neoplasm of breast: Secondary | ICD-10-CM | POA: Diagnosis not present

## 2020-09-04 DIAGNOSIS — I10 Essential (primary) hypertension: Secondary | ICD-10-CM

## 2020-09-04 NOTE — Patient Instructions (Addendum)
[facility] at [facility] [address],  [place]  [address] [address]: [phone]  Health Maintenance, Female Adopting a healthy lifestyle and getting preventive care are important in promoting health and wellness. Ask your health care provider about:  The right schedule for you to have regular tests and exams.  Things you can do on your own to prevent diseases and keep yourself healthy. What should I know about diet, weight, and exercise? Eat a healthy diet   Eat a diet that includes plenty of vegetables, fruits, low-fat dairy products, and lean protein.  Do not eat a lot of foods that are high in solid fats, added sugars, or sodium. Maintain a healthy weight Body mass index (BMI) is used to identify weight problems. It estimates body fat based on height and weight. Your health care provider can help determine your BMI and help you achieve or maintain a healthy weight. Get regular exercise Get regular exercise. This is one of the most important things you can do for your health. Most adults should:  Exercise for at least 150 minutes each week. The exercise should increase your heart rate and make you sweat (moderate-intensity exercise).  Do strengthening exercises at least twice a week. This is in addition to the moderate-intensity exercise.  Spend less time sitting. Even light physical activity can be beneficial. Watch cholesterol and blood lipids Have your blood tested for lipids and cholesterol at 48 years of age, then have this test every 5 years. Have your cholesterol levels checked more often if:  Your lipid or cholesterol levels are high.  You are older than 48 years of age.  You are at high risk for heart disease. What should I know about cancer screening? Depending on your health history and family history, you may need to have cancer screening at various ages. This may include screening for:  Breast cancer.  Cervical  cancer.  Colorectal cancer.  Skin cancer.  Lung cancer. What should I know about heart disease, diabetes, and high blood pressure? Blood pressure and heart disease  High blood pressure causes heart disease and increases the risk of stroke. This is more likely to develop in people who have high blood pressure readings, are of African descent, or are overweight.  Have your blood pressure checked: ? Every 3-5 years if you are 6-14 years of age. ? Every year if you are 62 years old or older. Diabetes Have regular diabetes screenings. This checks your fasting blood sugar level. Have the screening done:  Once every three years after age 46 if you are at a normal weight and have a low risk for diabetes.  More often and at a younger age if you are overweight or have a high risk for diabetes. What should I know about preventing infection? Hepatitis B If you have a higher risk for hepatitis B, you should be screened for this virus. Talk with your health care provider to find out if you are at risk for hepatitis B infection. Hepatitis C Testing is recommended for:  Everyone born from [birth year] through 1964.  Anyone with known risk factors for hepatitis C. Sexually transmitted infections (STIs)  Get screened for STIs, including gonorrhea and chlamydia, if: ? You are sexually active and are younger than 48 years of age. ? You are older than 48 years of age and your health care provider tells you that you are at risk for this type of infection. ? Your sexual activity has changed since you  were last screened, and you are at increased risk for chlamydia or gonorrhea. Ask your health care provider if you are at risk.  Ask your health care provider about whether you are at high risk for HIV. Your health care provider may recommend a prescription medicine to help prevent HIV infection. If you choose to take medicine to prevent HIV, you should first get tested for HIV. You should then be tested every 3  months for as long as you are taking the medicine. Pregnancy  If you are about to stop having your period (premenopausal) and you may become pregnant, seek counseling before you get pregnant.  Take 400 to 800 micrograms (mcg) of folic acid every day if you become pregnant.  Ask for birth control (contraception) if you want to prevent pregnancy. Osteoporosis and menopause Osteoporosis is a disease in which the bones lose minerals and strength with aging. This can result in bone fractures. If you are 46 years old or older, or if you are at risk for osteoporosis and fractures, ask your health care provider if you should:  Be screened for bone loss.  Take a calcium or vitamin D supplement to lower your risk of fractures.  Be given hormone replacement therapy (HRT) to treat symptoms of menopause. Follow these instructions at home: Lifestyle  Do not use any products that contain nicotine or tobacco, such as cigarettes, e-cigarettes, and chewing tobacco. If you need help quitting, ask your health care provider.  Do not use street drugs.  Do not share needles.  Ask your health care provider for help if you need support or information about quitting drugs. Alcohol use  Do not drink alcohol if: ? Your health care provider tells you not to drink. ? You are pregnant, may be pregnant, or are planning to become pregnant.  If you drink alcohol: ? Limit how much you use to 0-1 drink a day. ? Limit intake if you are breastfeeding.  Be aware of how much alcohol is in your drink. In the [place] one drink equals one 12 oz bottle of beer (355 mL), one 5 oz glass of wine (148 mL), or one 1 oz glass of hard liquor (44 mL). General instructions  Schedule regular health, dental, and eye exams.  Stay current with your vaccines.  Tell your health care provider if: ? You often feel depressed. ? You have ever been abused or do not feel safe at home. Summary  Adopting a healthy lifestyle and getting  preventive care are important in promoting health and wellness.  Follow your health care provider's instructions about healthy diet, exercising, and getting tested or screened for diseases.  Follow your health care provider's instructions on monitoring your cholesterol and blood pressure. This information is not intended to replace advice given to you by your health care provider. Make sure you discuss any questions you have with your health care provider. Document Revised: 11/09/2018 Document Reviewed: 11/09/2018 Elsevier Patient Education  2020 [employer].

## 2020-09-04 NOTE — Progress Notes (Signed)
Complete physical exam   Patient: Betty Rodriguez   DOB: 1971-01-26   49 y.o. Female  MRN: [id number] Visit Date: 09/04/2020  Today's healthcare provider: Mar Daring, PA-C   Chief Complaint  Patient presents with  . Annual Exam   Subjective    Betty Rodriguez is a 49 y.o. female who presents today for a complete physical exam.  She reports consuming a general diet. The patient does not participate in regular exercise at present. She generally feels well. She reports sleeping poorly. She does not have additional problems to discuss today.  HPI  07/13/19-Normal Pap smear. Repeat in 3 to 5 years 10/31/19-Normal mammogram. Repeat in 1 yr  Past Medical History:  Diagnosis Date  . Allergy   . Anxiety   . Chicken pox   . Kidney stones   . SVT (supraventricular tachycardia) ([place])    a. diagnosed 03/2017 in setting of hypokalemia   Past Surgical History:  Procedure Laterality Date  . BREAST BIOPSY Right 07/06/2016   us/ core/neg-BENIGN BREAST TISSUE   . TONSILLECTOMY  1983   Social History   Socioeconomic History  . Marital status: Divorced    Spouse name: Not on file  . Number of children: 3  . Years of education: Not on file  . Highest education level: Not on file  Occupational History  . Occupation: Microbiologist  Tobacco Use  . Smoking status: Never Smoker  . Smokeless tobacco: Never Used  Vaping Use  . Vaping Use: Never used  Substance and Sexual Activity  . Alcohol use: Yes    Alcohol/week: 0.0 standard drinks    Comment: Rare   . Drug use: No  . Sexual activity: Yes    Partners: Male    Birth control/protection: OCP    Comment: 1 partner   Other Topics Concern  . 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 Difficulty of Paying Living Expenses: Not on file  Food Insecurity:   . Worried About Charity fundraiser in the Last Year: Not on file  . Ran Out of Food in the Last Year: Not on file  Transportation Needs:   . Lack of Transportation (Medical): Not on file  . Lack of Transportation (Non-Medical): Not on file  Physical Activity:   . Days of Exercise per Week: Not on file  . Minutes of Exercise per Session: Not on file  Stress:   . Feeling of Stress : Not on file  Social Connections:   . Frequency of Communication with Friends and Family: Not on file  . Frequency of Social Gatherings with Friends and Family: Not on file  . Attends Religious Services: Not on file  . Active Member of Clubs or Organizations: Not on file  . Attends Archivist Meetings: Not on file  . Marital Status: Not on file  Intimate Partner Violence:   . Fear of Current or Ex-Partner: Not on file  . Emotionally Abused: Not on file  . Physically Abused: Not on file  . Sexually Abused: Not on file   Family Status  Relation Name Status  . Mother  Alive  .  Father  Alive  . Brother  Alive  . MGM  (Not Specified)  . MGF  (Not Specified)  . PGM  (Not Specified)  . Ethlyn Daniels  (Not Specified)  . Neg Hx  (Not Specified)   Family History  Problem Relation Age of Onset  . Hyperlipidemia Father   . Hypertension Father   . Diabetes Maternal Grandmother   . Hypertension Maternal Grandmother   . Heart disease Maternal Grandmother   . Stroke Maternal Grandmother 30  . Diabetes Maternal Grandfather   . Hypertension Maternal Grandfather   . Heart disease Maternal Grandfather   . Multiple sclerosis Paternal Grandmother   . Breast cancer Paternal Aunt   . Colon cancer Neg Hx   . Ovarian cancer Neg Hx    Allergies  Allergen Reactions  . Sulfa Antibiotics Hives  . Zoloft [Sertraline]     SVT    Patient Care Team: Virginia Crews, MD as PCP - General (Family  Medicine)   Medications: Outpatient Medications Prior to Visit  Medication Sig  . albuterol (VENTOLIN HFA) 108 (90 Base) MCG/ACT inhaler Inhale 2 puffs into the lungs every 4 (four) hours as needed for wheezing or shortness of breath.  . diltiazem (CARDIZEM) 30 MG tablet Take 1 tablet (30 mg total) by mouth every 6 (six) hours as needed (palpitations).  . fluticasone (FLONASE) 50 MCG/ACT nasal spray Place 2 sprays into both nostrils daily.  [name] FE 1/20 1-20 MG-MCG tablet Take 1 tablet by mouth daily. Take 4 packs continuously and then break for 1 week  . meclizine (ANTIVERT) 12.5 MG tablet Take 1 tablet (12.5 mg total) by mouth 3 (three) times daily as needed for dizziness.  . metoprolol succinate (TOPROL-XL) 25 MG 24 hr tablet Take 0.5 tablet (12.5 mg) by mouth once daily, you may take an extra 0.5 tablet (12.5 mg) once daily as needed for breakthrough tachycardia  . valACYclovir (VALTREX) 1000 MG tablet Take 1 tablet (1,000 mg total) by mouth 2 (two) times daily. for 3 days as needed   No facility-administered medications prior to visit.    Review of Systems  Constitutional: Positive for diaphoresis and fatigue.  HENT: Negative.   Eyes: Negative.   Respiratory: Negative.   Cardiovascular: Negative.   Gastrointestinal: Negative.   Endocrine: Negative.   Genitourinary: Negative.   Musculoskeletal: Negative.   Skin: Negative.   Allergic/Immunologic: Negative.   Neurological: Positive for dizziness and headaches.  Hematological: Negative.   Psychiatric/Behavioral: Negative.       Objective    BP 121/85 (BP Location: Left Arm, Patient Position: Sitting, Cuff Size: Large)   Pulse 88   Temp 98.7 F (37.1 C) (Oral)   Resp 16   Ht 5' 3"  (1.6 m)   Wt 129 lb (58.5 kg)   BMI 22.85 kg/m    Physical Exam Vitals reviewed.  Constitutional:      General: She is not in acute distress.    Appearance: Normal appearance. She is well-developed and normal weight. She is not  ill-appearing or diaphoretic.  HENT:     Head: Normocephalic and atraumatic.     Right Ear: Hearing, tympanic membrane, ear canal and external ear normal.     Left Ear: Hearing, tympanic membrane, ear canal and external ear normal.     Nose: Nose normal.     Mouth/Throat:     Mouth: Mucous membranes are moist.     Pharynx: Oropharynx is clear. Uvula midline. No oropharyngeal exudate.  Eyes:  General: No scleral icterus.       Right eye: No discharge.        Left eye: No discharge.     Extraocular Movements: Extraocular movements intact.     Conjunctiva/sclera: Conjunctivae normal.     Pupils: Pupils are equal, round, and reactive to light.  Neck:     Thyroid: No thyromegaly.     Vascular: No carotid bruit or JVD.     Trachea: No tracheal deviation.  Cardiovascular:     Rate and Rhythm: Normal rate and regular rhythm.     Pulses: Normal pulses.     Heart sounds: Normal heart sounds. No murmur heard.  No friction rub. No gallop.   Pulmonary:     Effort: Pulmonary effort is normal. No respiratory distress.     Breath sounds: Normal breath sounds. No wheezing or rales.  Chest:     Chest wall: No tenderness.     Breasts: Breasts are symmetrical.        Right: No inverted nipple, mass, nipple discharge, skin change or tenderness.        Left: No inverted nipple, mass, nipple discharge, skin change or tenderness.  Abdominal:     General: Bowel sounds are normal. There is no distension.     Palpations: Abdomen is soft. There is no mass.     Tenderness: There is no abdominal tenderness. There is no guarding or rebound.     Hernia: There is no hernia in the left inguinal area.  Genitourinary:    General: Normal vulva.     Exam position: Supine.     Labia:        Right: No rash, tenderness, lesion or injury.        Left: No rash, tenderness, lesion or injury.      Vagina: Normal. No signs of injury. No vaginal discharge, erythema, tenderness or bleeding.     Cervix: No cervical  motion tenderness, discharge or friability.     Adnexa:        Right: No mass, tenderness or fullness.         Left: No mass, tenderness or fullness.       Rectum: Normal.  Musculoskeletal:        General: No tenderness. Normal range of motion.     Cervical back: Normal range of motion and neck supple.     Right lower leg: No edema.     Left lower leg: No edema.  Lymphadenopathy:     Cervical: No cervical adenopathy.  Skin:    General: Skin is warm and dry.     Capillary Refill: Capillary refill takes less than 2 seconds.     Findings: No rash.  Neurological:     General: No focal deficit present.     Mental Status: She is alert and oriented to person, place, and time. Mental status is at baseline.     Cranial Nerves: No cranial nerve deficit.     Coordination: Coordination normal.     Deep Tendon Reflexes: Reflexes are normal and symmetric.  Psychiatric:        Mood and Affect: Mood normal.        Behavior: Behavior normal.        Thought Content: Thought content normal.        Judgment: Judgment normal.     Last depression screening scores PHQ 2/9 Scores 07/24/2020 07/11/2019 09/30/2018  PHQ - 2 Score 0 0 0  PHQ- 9 Score -  5 2   Last fall risk screening Fall Risk  07/11/2019  Falls in the past year? 0   Last Audit-C alcohol use screening Alcohol Use Disorder Test (AUDIT) 07/11/2019  1. How often do you have a drink containing alcohol? 1  2. How many drinks containing alcohol do you have on a typical day when you are drinking? 0  3. How often do you have six or more drinks on one occasion? 0  AUDIT-C Score 1   A score of 3 or more in women, and 4 or more in men indicates increased risk for alcohol abuse, EXCEPT if all of the points are from question 1   No results found for any visits on 09/04/20.  Assessment & Plan    Routine Health Maintenance and Physical Exam  Exercise Activities and Dietary recommendations Goals   None     Immunization History   Administered Date(s) Administered  . Influenza,inj,Quad PF,6+ Mos 08/30/2019, 07/24/2020  . Influenza-Unspecified 10/15/2017, 09/09/2018  . PFIZER SARS-COV-2 Vaccination 02/02/2020, 03/13/2020  . Tdap 05/23/2018    Health Maintenance  Topic Date Due  . Hepatitis C Screening  Never done  . PAP SMEAR-Modifier  07/10/2022  . TETANUS/TDAP  05/23/2028  . INFLUENZA VACCINE  Completed  . COVID-19 Vaccine  Completed  . HIV Screening  Completed    Discussed health benefits of physical activity, and encouraged her to engage in regular exercise appropriate for her age and condition.  1. Annual physical exam Normal physical exam today. Will check labs as below and f/u pending lab results. If labs are stable and WNL she will not need to have these rechecked for one year at her next annual physical exam. She is to call the office in the meantime if she has any acute issue, questions or concerns. - CBC with Differential/Platelet - Comprehensive metabolic panel - Hemoglobin A1c - Lipid panel - TSH  2. Encounter for breast cancer screening using non-mammogram modality Breast exam today was normal. There is no family history of breast cancer. She does perform regular self breast exams. Mammogram was ordered as below. Information for [facility] was given to patient so she may schedule her mammogram at her convenience. - MM 3D SCREEN BREAST BILATERAL; Future  3. Cervical cancer screening Pap collected today. Will send as below and f/u pending results. - IGP,Aptima HPV,CtNg Age Gdln  4. Colon cancer screening Due for colon cancer screening. Declines polyps or family history. Cologuard ordered. - Cologuard  5. Primary hypertension Stable. Continue Diltiazem and metoprolol.  6. SVT (supraventricular tachycardia) ([facility]) Stable on above medications.  7. Encounter for hepatitis C screening test for low risk patient Will check labs as below and f/u pending results. - Hepatitis C  Antibody   No follow-ups on file.     Reynolds Bowl, PA-C, have reviewed all documentation for this visit. The documentation on 09/08/20 for the exam, diagnosis, procedures, and orders are all accurate and complete.   Rubye Beach  [facility] [phone] (phone) [phone] (fax)  [place]

## 2020-09-05 LAB — LIPID PANEL
Chol/HDL Ratio: 3.1 ratio (ref 0.0–4.4)
Cholesterol, Total: 232 mg/dL — ABNORMAL HIGH (ref 100–199)
HDL: 74 mg/dL (ref >39)
LDL Chol Calc (NIH): 140 mg/dL — ABNORMAL HIGH (ref 0–99)
Triglycerides: 103 mg/dL (ref 0–149)
VLDL Cholesterol Cal: 18 mg/dL (ref 5–40)

## 2020-09-05 LAB — CBC WITH DIFFERENTIAL/PLATELET
Basophils Absolute: 0 10*3/uL (ref 0.0–0.2)
Basos: 1 %
EOS (ABSOLUTE): 0.1 10*3/uL (ref 0.0–0.4)
Eos: 1 %
Hematocrit: 39.7 % (ref 34.0–46.6)
Hemoglobin: 12.8 g/dL (ref 11.1–15.9)
Immature Grans (Abs): 0 10*3/uL (ref 0.0–0.1)
Immature Granulocytes: 0 %
Lymphocytes Absolute: 1.7 10*3/uL (ref 0.7–3.1)
Lymphs: 26 %
MCH: 30 pg (ref 26.6–33.0)
MCHC: 32.2 g/dL (ref 31.5–35.7)
MCV: 93 fL (ref 79–97)
Monocytes Absolute: 0.4 10*3/uL (ref 0.1–0.9)
Monocytes: 6 %
Neutrophils Absolute: 4.4 10*3/uL (ref 1.4–7.0)
Neutrophils: 66 %
Platelets: 313 10*3/uL (ref 150–450)
RBC: 4.27 x10E6/uL (ref 3.77–5.28)
RDW: 12.4 % (ref 11.7–15.4)
WBC: 6.6 10*3/uL (ref 3.4–10.8)

## 2020-09-05 LAB — COMPREHENSIVE METABOLIC PANEL
ALT: 15 IU/L (ref 0–32)
AST: 20 IU/L (ref 0–40)
Albumin/Globulin Ratio: 1.7 (ref 1.2–2.2)
Albumin: 4.6 g/dL (ref 3.8–4.8)
Alkaline Phosphatase: 41 IU/L — ABNORMAL LOW (ref 44–121)
BUN/Creatinine Ratio: 11 (ref 9–23)
BUN: 9 mg/dL (ref 6–24)
Bilirubin Total: 0.3 mg/dL (ref 0.0–1.2)
CO2: 18 mmol/L — ABNORMAL LOW (ref 20–29)
Calcium: 9.3 mg/dL (ref 8.7–10.2)
Chloride: 101 mmol/L (ref 96–106)
Creatinine, Ser: 0.85 mg/dL (ref 0.57–1.00)
GFR calc Af Amer: 94 mL/min/{1.73_m2} (ref >59)
GFR calc non Af Amer: 81 mL/min/{1.73_m2} (ref >59)
Globulin, Total: 2.7 g/dL (ref 1.5–4.5)
Glucose: 86 mg/dL (ref 65–99)
Potassium: 3.8 mmol/L (ref 3.5–5.2)
Sodium: 138 mmol/L (ref 134–144)
Total Protein: 7.3 g/dL (ref 6.0–8.5)

## 2020-09-05 LAB — HEMOGLOBIN A1C
Est. average glucose Bld gHb Est-mCnc: 111 mg/dL
Hgb A1c MFr Bld: 5.5 % (ref 4.8–5.6)

## 2020-09-05 LAB — TSH: TSH: 2.72 u[IU]/mL (ref 0.450–4.500)

## 2020-09-05 LAB — HEPATITIS C ANTIBODY: Hep C Virus Ab: <0.1 s/co ratio (ref 0.0–0.9)

## 2020-09-10 ENCOUNTER — Encounter: Payer: Self-pay | Admitting: Physician Assistant

## 2020-09-10 DIAGNOSIS — R87612 Low grade squamous intraepithelial lesion on cytologic smear of cervix (LGSIL): Secondary | ICD-10-CM

## 2020-09-10 LAB — IGP, APTIMA HPV, RFX 16/18,45
HPV Aptima: NEGATIVE
PAP Smear Comment: 0

## 2020-09-10 LAB — IGP,APTIMA HPV,CTNG AGE GDLN

## 2020-09-27 ENCOUNTER — Encounter: Payer: Self-pay | Admitting: Physician Assistant

## 2020-09-27 LAB — COLOGUARD

## 2020-09-30 ENCOUNTER — Other Ambulatory Visit: Payer: Self-pay

## 2020-09-30 ENCOUNTER — Ambulatory Visit: Payer: Managed Care, Other (non HMO) | Admitting: Obstetrics & Gynecology

## 2020-09-30 ENCOUNTER — Encounter: Payer: Self-pay | Admitting: Obstetrics & Gynecology

## 2020-09-30 VITALS — BP 148/86 | HR 115 | Resp 16 | Ht 63.0 in | Wt 130.0 lb

## 2020-09-30 DIAGNOSIS — R87612 Low grade squamous intraepithelial lesion on cytologic smear of cervix (LGSIL): Secondary | ICD-10-CM | POA: Diagnosis not present

## 2020-09-30 DIAGNOSIS — Z01812 Encounter for preprocedural laboratory examination: Secondary | ICD-10-CM | POA: Diagnosis not present

## 2020-09-30 LAB — POCT URINE PREGNANCY: Preg Test, Ur: NEGATIVE

## 2020-09-30 NOTE — Progress Notes (Signed)
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 The prior pap showed no abnormalities.  And HPV negative. 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  Procedure Details  The risks and benefits of the procedure and Written informed consent obtained.  Speculum placed in vagina and excellent visualization of cervix achieved, cervix swabbed x 3 with acetic acid solution.  Findings: Cervix: ? acetowhite lesion vs glands at 12  o'clock; cervix swabbed with Lugol's solution and cervical biopsies taken at 12 o'clock. Vaginal inspection: vaginal colposcopy not performed. Vulvar colposcopy: vulvar colposcopy not performed.  Specimens: 12 o'clock & ECC  Complications: bleeding. Controlled with monsels, silver nitrate and direct pressure.   Plan: Pt aware that ASCCP guidelines recommended rpt pap however, pt wanted to be sure and have colposcopy.

## 2020-09-30 NOTE — Progress Notes (Signed)
   Subjective:    Patient ID: Betty Rodriguez, female    DOB: September 09, 1971, 49 y.o.   MRN: [id number]  HPI  49 yo female present from PCP for colposcopy.  Pt works at [employer] and gets yearly pap smears paid for so she likes to obatin them yearly.  The past few yearly pap smear are negative awith negative HPV.  Pt does have a history of cryo at age 35 and HPV+ about 10 years ago.    Review of Systems  Constitutional: Negative.   Respiratory: Negative.   Cardiovascular: Negative.   Gastrointestinal: Negative.   Genitourinary: Negative.        Objective:   Physical Exam Vitals reviewed.  Constitutional:      General: She is not in acute distress.    Appearance: She is well-developed.  HENT:     Head: Normocephalic and atraumatic.  Eyes:     Conjunctiva/sclera: Conjunctivae normal.  Cardiovascular:     Rate and Rhythm: Normal rate.  Pulmonary:     Effort: Pulmonary effort is normal.  Skin:    General: Skin is warm and dry.  Neurological:     Mental Status: She is alert and oriented to person, place, and time.       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  1.  Pt would liek to proceed with colposcopy. 2.  Advised to extend interval of pap smears as recommended by ACOG / ASCCP to avoid false positives.  3.  See colpo note.

## 2020-10-01 ENCOUNTER — Telehealth: Payer: Self-pay

## 2020-10-01 NOTE — Telephone Encounter (Signed)
Pt called stating she was still feeling cramping from colposcopy yesterday and wanted to know if it was normal. I told pt to take Ibuprofen and see if it improves. I told pt to call us tomorrow morning if pain has not improved and she can come in to be seen.

## 2020-10-03 ENCOUNTER — Telehealth: Payer: Self-pay | Admitting: *Deleted

## 2020-10-03 LAB — ANATOMIC PATHOLOGY REPORT

## 2020-10-03 NOTE — Telephone Encounter (Signed)
Pt called stating that she had a Colposcopy by Dr Gala Romney on Monday without incident.  Today she states that she has been having abd pain since Monday and it is not getting any better.  She had been advised to use Ibuprofen and heating pad.  I recommended she f/u here tomorrow(appt given) and continue with the Ibuprofen and heating pad until then.  Pt agrees with plan.  Results of biopsy are not available yet.

## 2020-10-04 ENCOUNTER — Ambulatory Visit: Payer: Self-pay

## 2020-10-04 ENCOUNTER — Other Ambulatory Visit: Payer: Self-pay

## 2020-10-04 ENCOUNTER — Ambulatory Visit: Payer: Managed Care, Other (non HMO) | Admitting: Advanced Practice Midwife

## 2020-10-04 ENCOUNTER — Ambulatory Visit: Payer: Managed Care, Other (non HMO) | Admitting: Adult Health

## 2020-10-04 ENCOUNTER — Encounter: Payer: Self-pay | Admitting: Adult Health

## 2020-10-04 ENCOUNTER — Telehealth: Payer: Self-pay | Admitting: *Deleted

## 2020-10-04 VITALS — BP 153/85 | HR 94 | Temp 98.4°F | Resp 16 | Wt 131.2 lb

## 2020-10-04 DIAGNOSIS — Z9889 Other specified postprocedural states: Secondary | ICD-10-CM | POA: Diagnosis not present

## 2020-10-04 DIAGNOSIS — R102 Pelvic and perineal pain: Secondary | ICD-10-CM | POA: Insufficient documentation

## 2020-10-04 DIAGNOSIS — N72 Inflammatory disease of cervix uteri: Secondary | ICD-10-CM | POA: Insufficient documentation

## 2020-10-04 LAB — POCT URINALYSIS DIPSTICK
Bilirubin, UA: NEGATIVE
Glucose, UA: NEGATIVE
Ketones, UA: NEGATIVE
Leukocytes, UA: NEGATIVE
Nitrite, UA: NEGATIVE
Protein, UA: NEGATIVE
Spec Grav, UA: 1.015 (ref 1.010–1.025)
Urobilinogen, UA: 0.2 E.U./dL
pH, UA: 5 (ref 5.0–8.0)

## 2020-10-04 MED ORDER — FLUCONAZOLE 150 MG PO TABS: 150.0000 mg | ORAL_TABLET | Freq: Once | ORAL | 0 refills | Status: AC

## 2020-10-04 MED ORDER — DOXYCYCLINE HYCLATE 100 MG PO TABS: 100.0000 mg | ORAL_TABLET | Freq: Two times a day (BID) | ORAL | 0 refills | Status: DC

## 2020-10-04 NOTE — Telephone Encounter (Signed)
Please advise 

## 2020-10-04 NOTE — Telephone Encounter (Signed)
Pt. Reports she had cervical biopsy Monday and has had some lower abdominal pain that comes and goes. Pain is 4-5/10. Minimal brown vaginal discharge since procedure. Her OB/GYN is not in the office until 10/15/20 and she does not want to see the PA in their office. Wants to know if Dr. Brita Romp will see her today or another provider. Spoke with Lodge in the practice. Please advise pt.  Answer Assessment - Initial Assessment Questions 1. LOCATION: "Where does it hurt?"      Lower abdominal 2. RADIATION: "Does the pain shoot anywhere else?" (e.g., chest, back)     No 3. ONSET: "When did the pain begin?" (e.g., minutes, hours or days ago)      Monday 4. SUDDEN: "Gradual or sudden onset?"     After biopsy 5. PATTERN "Does the pain come and go, or is it constant?"    - If constant: "Is it getting better, staying the same, or worsening?"      (Note: Constant means the pain never goes away completely; most serious pain is constant and it progresses)     - If intermittent: "How long does it last?" "Do you have pain now?"     (Note: Intermittent means the pain goes away completely between bouts)     Comes and goes 6. SEVERITY: "How bad is the pain?"  (e.g., Scale 1-10; mild, moderate, or severe)   - MILD (1-3): doesn't interfere with normal activities, abdomen soft and not tender to touch    - MODERATE (4-7): interferes with normal activities or awakens from sleep, tender to touch    - SEVERE (8-10): excruciating pain, doubled over, unable to do any normal activities      4-5 7. RECURRENT SYMPTOM: "Have you ever had this type of stomach pain before?" If Yes, ask: "When was the last time?" and "What happened that time?"      No 8. CAUSE: "What do you think is causing the stomach pain?"     Biopsy 9. RELIEVING/AGGRAVATING FACTORS: "What makes it better or worse?" (e.g., movement, antacids, bowel movement)     Tylenol helps some 10. OTHER SYMPTOMS: "Has there been any vomiting, diarrhea,  constipation, or urine problems?"       No 11. PREGNANCY: "Is there any chance you are pregnant?" "When was your last menstrual period?"       No  Protocols used: ABDOMINAL PAIN - [facility]

## 2020-10-04 NOTE — Telephone Encounter (Signed)
Pt saw [employer],   -Mickel Baas

## 2020-10-04 NOTE — Progress Notes (Signed)
Established patient visit   Patient: Betty Rodriguez   DOB: [birth year]   49 y.o. Female  MRN: [id number] Visit Date: 10/04/2020  Today's healthcare provider: Marcille Buffy, FNP   Chief Complaint  Patient presents with  . Abdominal Pain   Subjective    HPI  Abdominal Pain  She reports new onset abdominal pain. The most recent episode started a few days ago and is gradually worsening. The abdominal pain is located in the left lower quadrant and radiates into pelvis  It is described as aching and pressure-like, is 4/10 in intensity, occurring constantly. It is aggravated by nothing and is relieved by tylenol with mild improvement for 1- 2 hours. .  She saw Grace Bushy PA, abnormal PAP sent to gynecology for PAP   Comment: EPITHELIAL CELL ABNORMALITY.  LOW GRADE SQUAMOUS INTRAEPITHELIAL LESION (LSIL).    Recommendation:  CommentAbnormal      Comment: Suggest follow up as clinically appropriate.    Patient reports that she had a colposcopy on Monday 09/30/2020 performed by her gynecologist Dr. Gala Romney  patient states she initially had abdominal cramping and brown discharge as she was told she might experience. Brown discharge has gradually subsided. Colposcopy showed inflammation she reports she got a negative for atypical cells.   Denies any bright red bleeding.  She reports she had no pain prior to her procedure. Denies any other vaginal .  Normal BM this morning, not black in color or bloody. No mucous. Denies constipation.  Urination reported as normal.     Patient states today she has a constant "deep pressure like" feeling in her LLQ. Some mild pressure today has improved. Denies any concerns for STD's or high risk sexual behavior.  Denies any bright red bleeding or loss of bowl or bladder control.   Denies any urinary symptoms.   Patient  denies any fever, body aches,chills other than LLQ / pelvic pain left , rash, chest pain, shortness of breath,  nausea, vomiting, or diarrhea.  Denies dizziness, lightheadedness, pre syncopal or syncopal episodes.    Associated symptoms: No anorexia  No belching  No bloody stool No blood in urine   No constipation No diarrhea  No dysuria No fever  No flatus No headaches  No headaches No joint pains  No myalgias No nausea  No vomiting No weight loss       Previous labs Lab Results  Component Value Date   WBC 6.6 09/04/2020   HGB 12.8 09/04/2020   HCT 39.7 09/04/2020   MCV 93 09/04/2020   MCH 30.0 09/04/2020   RDW 12.4 09/04/2020   PLT 313 09/04/2020   Lab Results  Component Value Date   GLUCOSE 86 09/04/2020   NA 138 09/04/2020   K 3.8 09/04/2020   CL 101 09/04/2020   CO2 18 (L) 09/04/2020   BUN 9 09/04/2020   CREATININE 0.85 09/04/2020   GFRNONAA 81 09/04/2020   GFRAA 94 09/04/2020   CALCIUM 9.3 09/04/2020   PROT 7.3 09/04/2020   ALBUMIN 4.6 09/04/2020   LABGLOB 2.7 09/04/2020   AGRATIO 1.7 09/04/2020   BILITOT 0.3 09/04/2020   ALKPHOS 41 (L) 09/04/2020   AST 20 09/04/2020   ALT 15 09/04/2020   ANIONGAP 10 04/20/2017   No results found for: AMYLASE -----------------------------------------------------------------------------------------      Medications: Outpatient Medications Prior to Visit  Medication Sig  . albuterol (VENTOLIN HFA) 108 (90 Base) MCG/ACT inhaler Inhale 2 puffs into the lungs every 4 (four)  hours as needed for wheezing or shortness of breath.  . diltiazem (CARDIZEM) 30 MG tablet Take 1 tablet (30 mg total) by mouth every 6 (six) hours as needed (palpitations).  . fluticasone (FLONASE) 50 MCG/ACT nasal spray Place 2 sprays into both nostrils daily.  [name] FE 1/20 1-20 MG-MCG tablet Take 1 tablet by mouth daily. Take 4 packs continuously and then break for 1 week  . meclizine (ANTIVERT) 12.5 MG tablet Take 1 tablet (12.5 mg total) by mouth 3 (three) times daily as needed for dizziness.  . metoprolol succinate (TOPROL-XL) 25 MG 24 hr tablet Take  0.5 tablet (12.5 mg) by mouth once daily, you may take an extra 0.5 tablet (12.5 mg) once daily as needed for breakthrough tachycardia  . valACYclovir (VALTREX) 1000 MG tablet Take 1 tablet (1,000 mg total) by mouth 2 (two) times daily. for 3 days as needed   No facility-administered medications prior to visit.    Review of Systems  Constitutional: Negative.  Negative for chills, fatigue and fever.  HENT: Negative.   Respiratory: Negative.   Cardiovascular: Negative.   Gastrointestinal: Negative.   Genitourinary: Positive for pelvic pain, vaginal bleeding (brown discharge, denies any bright red bleeding or clots. ) and vaginal discharge (decreasing brown vaginal discharge since colposcopy becoming scant. ). Negative for decreased urine volume, difficulty urinating, dyspareunia, dysuria, enuresis, flank pain, frequency, genital sores, hematuria, menstrual problem, urgency and vaginal pain.  Musculoskeletal: Negative.   Skin: Negative.  Negative for rash.  Neurological: Negative.   Psychiatric/Behavioral: Negative.       Objective    BP (!) 153/85   Pulse 94   Temp 98.4 F (36.9 C) (Oral)   Resp 16   Wt 131 lb 3.2 oz (59.5 kg)   LMP 09/13/2020   BMI 23.24 kg/m  BP Readings from Last 3 Encounters:  10/04/20 (!) 153/85  09/30/20 (!) 148/86  09/04/20 121/85      Physical Exam Vitals and nursing note reviewed. Exam conducted with a chaperone present.  Constitutional:      General: She is not in acute distress.    Appearance: She is well-developed. She is not ill-appearing, toxic-appearing or diaphoretic.     Comments: Patient appers well, not sickly. Speaking in complete sentences. Patient moves on and off of exam table and in room without difficulty. Gait is normal in hall and in room. Patient is oriented to person place time and situation. Patient answers questions appropriately and engages eye contact and verbal dialect with provider.   Significant other with patient in  room.   HENT:     Head: Normocephalic and atraumatic.     Mouth/Throat:     Mouth: Mucous membranes are moist.     Pharynx: Oropharynx is clear.  Eyes:     Pupils: Pupils are equal, round, and reactive to light.  Cardiovascular:     Rate and Rhythm: Normal rate and regular rhythm.     Heart sounds: Normal heart sounds. No murmur heard.  No friction rub. No gallop.   Pulmonary:     Effort: Pulmonary effort is normal. No respiratory distress.     Breath sounds: Normal breath sounds. No stridor. No wheezing, rhonchi or rales.  Chest:     Chest wall: No tenderness.  Abdominal:     General: Abdomen is flat. Bowel sounds are normal. There is no distension or abdominal bruit. There are no signs of injury.     Palpations: Abdomen is soft. There is no shifting  dullness, fluid wave, hepatomegaly, splenomegaly, mass or pulsatile mass.     Tenderness: There is abdominal tenderness in the suprapubic area and left lower quadrant. There is no right CVA tenderness, left CVA tenderness, guarding or rebound.    Genitourinary:    General: Normal vulva.     Exam position: Lithotomy position.     Pubic Area: No rash or pubic lice.      Tanner stage (genital): 5.     Labia:        Right: No rash, tenderness, lesion or injury.        Left: No rash, tenderness, lesion or injury.      Urethra: No prolapse, urethral pain, urethral swelling or urethral lesion.     Vagina: Normal. No foreign body. No erythema or bleeding.     Cervix: Discharge and cervical bleeding (very scant brown  discharge in vagina.) present. No cervical motion tenderness, friability, erythema or eversion.     Uterus: Normal. Not enlarged and not tender.      Adnexa:        Right: No mass, tenderness or fullness.         Left: No mass, tenderness or fullness.       Rectum: Normal.        Comments: She was in no acute distress and demonstrated mild tenderness on bimanual exam left pelvic region only. No bleeding bright red  was  visualized, she did have scant brown discharge noted in vagina. Cervical exam revealed yellow/ white thick cervical discharge moderate amount at os  sent NuSwab to lab.  Cervix and vaginal exam otherwise normal.  Skin:    Coloration: Skin is not pale.     Findings: No erythema.  Neurological:     General: No focal deficit present.     Mental Status: She is alert and oriented to person, place, and time.     Motor: No weakness.  Psychiatric:        Mood and Affect: Mood normal. Mood is not anxious or depressed.        Behavior: Behavior normal.      Results for orders placed or performed in visit on 10/04/20  POCT urinalysis dipstick  Result Value Ref Range   Color, UA yellow    Clarity, UA clear    Glucose, UA Negative Negative   Bilirubin, UA negative    Ketones, UA negative    Spec Grav, UA 1.015 1.010 - 1.025   Blood, UA large    pH, UA 5.0 5.0 - 8.0   Protein, UA Negative Negative   Urobilinogen, UA 0.2 0.2 or 1.0 E.U./dL   Nitrite, UA negative    Leukocytes, UA Negative Negative   Appearance     Odor      Assessment & Plan     Cervicitis- nonspecific   Pelvic pain in female - Plan: CBC with Differential/Platelet, NuSwab Vaginitis Plus (VG+), Comprehensive Metabolic Panel (CMET), Urine Culture, doxycycline (VIBRA-TABS) 100 MG tablet, fluconazole (DIFLUCAN) 150 MG tablet, POCT urinalysis dipstick  History of colposcopy with cervical biopsy  Orders Placed This Encounter  Procedures  . Urine Culture  . CBC with Differential/Platelet  . NuSwab Vaginitis Plus (VG+)  . Comprehensive Metabolic Panel (CMET)  . POCT urinalysis dipstick   Will treat empirically per up to date guidelines with Doxycycline as directed for non specific cervicitis, post colposcopy with non concerns reported by patient for STD's and recent symptoms starting after colposcopy. She was in no acute  distress and demonstrated mild tenderness on bimanual exam left pelvic region only. No bleeding bright  red  was visualized, she did have scant brown discharge noted in vagina. Cervical exam revealed yellow cervical discharge moderate amount sent NuSwab to lab.  Cervix and vaginal exam otherwise normal.  Inflammation/ internal bruising post procedure suspected as well. Cool packs to lower abdomen advised.   She has only tried Tylenol, at maximum dose and reports it is helping, ok to do Advil per package in between.  She asks for Diflucan post antibiotics if needed.   Refrain from anything vaginally, no intercourse or tampons etc.   An After Visit Summary was printed and given to the patient. Follow up with Dr. Barnett Abu advised/ gynecology. Recommend follow up with Dr. Barnett Abu early next week.  [facility] if symptoms worsen at anytime or fever.   Return in about 1 week (around 10/11/2020), or if symptoms worsen or fail to improve, for at any time for any worsening symptoms, Go to Emergency room/ urgent care if worse.      Time spent for billing 30 minutes.   Marcille Buffy, [place] [phone] (phone) [phone] (fax)  [place]

## 2020-10-04 NOTE — Patient Instructions (Addendum)
Recommend follow up with Dr. Barnett Abu early next week.  [facility] if symptoms worsen at anytime or fever.   When to seek help after colposcopy -- Call your health care provider if you have any of the following after colposcopy:  ?Heavy vaginal bleeding (soaking through a large menstrual pad in an hour for two hours)  ?Vaginal bleeding for more than seven days  ?Foul smelling vaginal discharge; remember that the brown/black, coffee-ground discharge is normal for the first few days  ?Pelvic pain or cramps that do not improve with ibuprofen (Advil, Motrin)  ?Temperature greater than 100.10F or 38C    Cervicitis  Cervicitis is when the cervix gets irritated and swollen. The cervix is the part of the womb (uterus)that opens up to the vagina. What are the causes?  An STI (sexually transmitted infection).  Objects, such as tampons, that are put in the vagina and left for too long.  A reaction to something that is put in the vagina, such as a douche.  An injury to the cervix.  An infection.  Radiation therapy. What increases the risk?  Unprotected sex.  Sex with a lot of partners.  A new sex partner.  Sex at a young age.  A history of STIs. What are the signs or symptoms?  Discharge from the vagina that smells bad or is gray, white, or yellow.  Pain or itchiness around the vagina.  Pain during sex.  Pain in the lower belly area or lower back, especially during sex.  Peeing (urinating) often.  Pain while peeing.  Bleeding from the vagina that is not normal, such as bleeding between menstrual periods, after sex, or after menopause. How is this diagnosed?  A pelvic exam.  A test of fluid from the vagina.  A swab test of the cells of the cervix.  Swabs sent to a lab.  Pee tests. How is this treated?  Medicines such as antibiotics or antivirals. If you are taking these, your sex partner may also need to take  them.  Stopping the use of tampons or other things if they cause irritation. Follow these instructions at home: Medicines  Take over-the-counter and prescription medicines only as told by your doctor.  If you were prescribed an antibiotic medicine, take it as told by your doctor. Do not stop taking it even if you start to feel better. General instructions  Do not have sex until your doctor says it is okay.  Keep all follow-up visits as told by your doctor. This is important. Contact a doctor if:  Your symptoms come back or get worse.  You have a fever.  You feel very tired.  Your belly hurts.  You feel like you are going to vomit or you vomit.  You have watery poop (diarrhea).  Your back hurts. Get help right away if:  You have very bad pain in your belly, and medicine does not help it.  You cannot pee. Summary  This condition happens when the cervix gets irritated and swollen.  It is caused by an infection, a reaction, or an injury to the cervix.  Take all medicines only as told by your doctor.  If you need to take an antibiotic, do not stop taking it even if you start to feel better.  Do not have sex until your doctor says it is okay. This information is not intended to replace advice given to you by your health care provider. Make sure you discuss  any questions you have with your health care provider. Document Revised: 05/11/2019 Document Reviewed: 05/11/2019 Elsevier Patient Education  [place]. Cervical Biopsy, Care After This sheet gives you information about how to care for yourself after your procedure. Your health care provider may also give you more specific instructions. If you have problems or questions, contact your health care provider. What can I expect after the procedure? After the procedure, it is common to have:  Cramping or mild pain for a few days.  Slight bleeding from the vagina for a few days.  Dark-colored vaginal discharge  for a few days. Follow these instructions at home: Lifestyle  Do not douche until your health care provider approves.  Do not use tampons until your health care provider approves.  Do not have sex until your health care provider approves.  Return to your normal activities as told by your health care provider. Ask your health care provider what activities are safe for you. General instructions   Take over-the-counter and prescription medicines only as told by your health care provider.  Use a sanitary napkin until bleeding and discharge stop.  Keep all follow-up visits as told by your health care provider. This is important. Contact a health care provider if you have:  A fever or chills.  Bad-smelling vaginal discharge.  Itching or irritation around the vagina.  Lower abdominal pain. Get help right away if you:  Develop heavy vaginal bleeding that soaks more than one sanitary napkin an hour.  Faint.  Have severe pain in your lower abdomen. Summary  After the procedure, it is normal to have cramps, slight bleeding, and dark-colored discharge from the vagina.  After you return home, do not douche, have sex, or use a tampon until your health care provider approves.  Take over-the-counter and prescription medicines only as told by your health care provider.  Get help right away if you develop heavy bleeding, you faint, or you have severe pain in your lower abdomen. This information is not intended to replace advice given to you by your health care provider. Make sure you discuss any questions you have with your health care provider. Document Revised: 04/21/2018 Document Reviewed: 04/21/2018 Elsevier Patient Education  2020 [place]. Follow up with gynecologist next week, call for follow up with Dr. B next week if needed,  Go to [facility] hospital if any symptoms worsen for further evaluation.  Advised patient call the office or your primary care doctor for an  appointment if no improvement within 72 hours or if any symptoms change or worsen at any time  Advised ER or urgent Care if after hours or on weekend. Call 911 for emergency symptoms at any time.Patinet verbalized understanding of all instructions given/reviewed and treatment plan and has no further questions or concerns at this time.       Doxycycline tablets or capsules What is this medicine? DOXYCYCLINE (dox i SYE kleen) is a tetracycline antibiotic. It kills certain bacteria or stops their growth. It is used to treat many kinds of infections, like dental, skin, respiratory, and urinary tract infections. It also treats acne, Lyme disease, malaria, and certain sexually transmitted infections. This medicine may be used for other purposes; ask your health care provider or pharmacist if you have questions. COMMON BRAND NAME(S): Acticlate, Adoxa, Adoxa CK, Adoxa Pak, Adoxa TT, Alodox, Avidoxy, Doxal, LYMEPAK, Mondoxyne NL, Monodox, Morgidox 1x, Morgidox 1x Kit, Morgidox 2x, Morgidox 2x Kit, NutriDox, Ocudox, [place], [place], Vibra-Tabs, Vibramycin What should I tell my  health care provider before I take this medicine? They need to know if you have any of these conditions:  liver disease  long exposure to sunlight like working outdoors  stomach problems like colitis  an unusual or allergic reaction to doxycycline, tetracycline antibiotics, other medicines, foods, dyes, or preservatives  pregnant or trying to get pregnant  breast-feeding How should I use this medicine? Take this medicine by mouth with a full glass of water. Follow the directions on the prescription label. It is best to take this medicine without food, but if it upsets your stomach take it with food. Take your medicine at regular intervals. Do not take your medicine more often than directed. Take all of your medicine as directed even if you think you are better. Do not skip doses or stop your medicine early. Talk to your  pediatrician regarding the use of this medicine in children. While this drug may be prescribed for selected conditions, precautions do apply. Overdosage: If you think you have taken too much of this medicine contact a poison control center or emergency room at once. NOTE: This medicine is only for you. Do not share this medicine with others. What if I miss a dose? If you miss a dose, take it as soon as you can. If it is almost time for your next dose, take only that dose. Do not take double or extra doses. What may interact with this medicine?  antacids  barbiturates  birth control pills  bismuth subsalicylate  carbamazepine  methoxyflurane  other antibiotics  phenytoin  vitamins that contain iron  warfarin This list may not describe all possible interactions. Give your health care provider a list of all the medicines, herbs, non-prescription drugs, or dietary supplements you use. Also tell them if you smoke, drink alcohol, or use illegal drugs. Some items may interact with your medicine. What should I watch for while using this medicine? Tell your doctor or health care professional if your symptoms do not improve. Do not treat diarrhea with over the counter products. Contact your doctor if you have diarrhea that lasts more than 2 days or if it is severe and watery. Do not take this medicine just before going to bed. It may not dissolve properly when you lay down and can cause pain in your throat. Drink plenty of fluids while taking this medicine to also help reduce irritation in your throat. This medicine can make you more sensitive to the sun. Keep out of the sun. If you cannot avoid being in the sun, wear protective clothing and use sunscreen. Do not use sun lamps or tanning beds/booths. Birth control pills may not work properly while you are taking this medicine. Talk to your doctor about using an extra method of birth control. If you are being treated for a sexually transmitted  infection, avoid sexual contact until you have finished your treatment. Your sexual partner may also need treatment. Avoid antacids, aluminum, calcium, magnesium, and iron products for 4 hours before and 2 hours after taking a dose of this medicine. If you are using this medicine to prevent malaria, you should still protect yourself from contact with mosquitos. Stay in screened-in areas, use mosquito nets, keep your body covered, and use an insect repellent. What side effects may I notice from receiving this medicine? Side effects that you should report to your doctor or health care professional as soon as possible:  allergic reactions like skin rash, itching or hives, swelling of the face, lips, or tongue  difficulty breathing  fever  itching in the rectal or genital area  pain on swallowing  rash, fever, and swollen lymph nodes  redness, blistering, peeling or loosening of the skin, including inside the mouth  severe stomach pain or cramps  unusual bleeding or bruising  unusually weak or tired  yellowing of the eyes or skin Side effects that usually do not require medical attention (report to your doctor or health care professional if they continue or are bothersome):  diarrhea  loss of appetite  nausea, vomiting This list may not describe all possible side effects. Call your doctor for medical advice about side effects. You may report side effects to FDA at 1-800-FDA-1088. Where should I keep my medicine? Keep out of the reach of children. Store at room temperature, below 30 degrees C (86 degrees F). Protect from light. Keep container tightly closed. Throw away any unused medicine after the expiration date. Taking this medicine after the expiration date can make you seriously ill. NOTE: This sheet is a summary. It may not cover all possible information. If you have questions about this medicine, talk to your doctor, pharmacist, or health care provider.  2020 Elsevier/Gold  Standard (2019-02-16 13:44:53)  Pelvic Pain, Female Pelvic pain is pain in your lower belly (abdomen), below your belly button and between your hips. The pain may start suddenly (be acute), keep coming back (be recurring), or last a long time (become chronic). Pelvic pain that lasts longer than 6 months is called chronic pelvic pain. There are many causes of pelvic pain. Sometimes the cause of pelvic pain is not known. Follow these instructions at home:   Take over-the-counter and prescription medicines only as told by your doctor.  Rest as told by your doctor.  Do not have sex if it hurts.  Keep a journal of your pelvic pain. Write down: ? When the pain started. ? Where the pain is located. ? What seems to make the pain better or worse, such as food or your period (menstrual cycle). ? Any symptoms you have along with the pain.  Keep all follow-up visits as told by your doctor. This is important. Contact a doctor if:  Medicine does not help your pain.  Your pain comes back.  You have new symptoms.  You have unusual discharge or bleeding from your vagina.  You have a fever or chills.  You are having trouble pooping (constipation).  You have blood in your pee (urine) or poop (stool).  Your pee smells bad.  You feel weak or light-headed. Get help right away if:  You have sudden pain that is very bad.  Your pain keeps getting worse.  You have very bad pain and also have any of these symptoms: ? A fever. ? Feeling sick to your stomach (nausea). ? Throwing up (vomiting). ? Being very sweaty.  You pass out (lose consciousness). Summary  Pelvic pain is pain in your lower belly (abdomen), below your belly button and between your hips.  There are many possible causes of pelvic pain.  Keep a journal of your pelvic pain. This information is not intended to replace advice given to you by your health care provider. Make sure you discuss any questions you have with your  health care provider. Document Revised: 05/04/2018 Document Reviewed: 05/04/2018 Elsevier Patient Education  [place].

## 2020-10-04 NOTE — Telephone Encounter (Signed)
I don't have anything available unfortunately.  I am not in the office this afternoon.  She would likely see a PA here if there is anything available, so I guess its PA there or PA here.  I might be able to work her in Monday, but seems like she wants to see someone today

## 2020-10-04 NOTE — Telephone Encounter (Signed)
Patient only wants to schedule with Dr. Gala Romney. Patient told that Dr. Gala Romney is not in the [place] office until 10/14/2020. Patient states that she needs to think about what she is going to do about an appointment.

## 2020-10-05 LAB — COMPREHENSIVE METABOLIC PANEL
ALT: 14 IU/L (ref 0–32)
AST: 17 IU/L (ref 0–40)
Albumin/Globulin Ratio: 1.6 (ref 1.2–2.2)
Albumin: 4.2 g/dL (ref 3.8–4.8)
Alkaline Phosphatase: 42 IU/L — ABNORMAL LOW (ref 44–121)
BUN/Creatinine Ratio: 10 (ref 9–23)
BUN: 8 mg/dL (ref 6–24)
Bilirubin Total: 0.2 mg/dL (ref 0.0–1.2)
CO2: 21 mmol/L (ref 20–29)
Calcium: 9.1 mg/dL (ref 8.7–10.2)
Chloride: 103 mmol/L (ref 96–106)
Creatinine, Ser: 0.77 mg/dL (ref 0.57–1.00)
GFR calc Af Amer: 106 mL/min/{1.73_m2} (ref >59)
GFR calc non Af Amer: 92 mL/min/{1.73_m2} (ref >59)
Globulin, Total: 2.6 g/dL (ref 1.5–4.5)
Glucose: 93 mg/dL (ref 65–99)
Potassium: 4 mmol/L (ref 3.5–5.2)
Sodium: 141 mmol/L (ref 134–144)
Total Protein: 6.8 g/dL (ref 6.0–8.5)

## 2020-10-05 LAB — CBC WITH DIFFERENTIAL/PLATELET
Basophils Absolute: 0 10*3/uL (ref 0.0–0.2)
Basos: 0 %
EOS (ABSOLUTE): 0.1 10*3/uL (ref 0.0–0.4)
Eos: 1 %
Hematocrit: 36.5 % (ref 34.0–46.6)
Hemoglobin: 12.3 g/dL (ref 11.1–15.9)
Immature Grans (Abs): 0 10*3/uL (ref 0.0–0.1)
Immature Granulocytes: 0 %
Lymphocytes Absolute: 2.1 10*3/uL (ref 0.7–3.1)
Lymphs: 22 %
MCH: 30.4 pg (ref 26.6–33.0)
MCHC: 33.7 g/dL (ref 31.5–35.7)
MCV: 90 fL (ref 79–97)
Monocytes Absolute: 0.5 10*3/uL (ref 0.1–0.9)
Monocytes: 6 %
Neutrophils Absolute: 6.7 10*3/uL (ref 1.4–7.0)
Neutrophils: 71 %
Platelets: 269 10*3/uL (ref 150–450)
RBC: 4.04 x10E6/uL (ref 3.77–5.28)
RDW: 12.1 % (ref 11.7–15.4)
WBC: 9.5 10*3/uL (ref 3.4–10.8)

## 2020-10-06 LAB — URINE CULTURE: Organism ID, Bacteria: NO GROWTH

## 2020-10-07 LAB — NUSWAB VAGINITIS PLUS (VG+)
Candida albicans, NAA: NEGATIVE
Candida glabrata, NAA: NEGATIVE
Chlamydia trachomatis, NAA: NEGATIVE
Neisseria gonorrhoeae, NAA: NEGATIVE
Trich vag by NAA: NEGATIVE

## 2020-10-07 LAB — COLOGUARD: Cologuard: NEGATIVE

## 2020-10-07 NOTE — Progress Notes (Signed)
CBC and CMP  within normal limits, No growth in urine, Vaginal swab negative. Suspect diagnosis as in clinic completed doxycycline, keep follow up as advised, return sooner / see gynecology if worsening.

## 2020-10-08 ENCOUNTER — Encounter: Payer: Self-pay | Admitting: Physician Assistant

## 2020-10-10 ENCOUNTER — Encounter: Payer: Self-pay | Admitting: Adult Health

## 2020-10-10 ENCOUNTER — Ambulatory Visit
Admission: RE | Admit: 2020-10-10 | Discharge: 2020-10-10 | Disposition: A | Discharge status: Home / Self Care | Payer: Managed Care, Other (non HMO) | Source: Ambulatory Visit | Attending: Adult Health | Admitting: Adult Health

## 2020-10-10 ENCOUNTER — Other Ambulatory Visit: Payer: Self-pay

## 2020-10-10 ENCOUNTER — Ambulatory Visit
Admission: RE | Admit: 2020-10-10 | Discharge: 2020-10-10 | Disposition: A | Discharge status: Home / Self Care | Payer: Managed Care, Other (non HMO) | Attending: Adult Health | Admitting: Adult Health

## 2020-10-10 ENCOUNTER — Ambulatory Visit: Payer: Managed Care, Other (non HMO) | Admitting: Adult Health

## 2020-10-10 VITALS — BP 140/72 | HR 93 | Temp 98.2°F | Resp 16 | Wt 132.2 lb

## 2020-10-10 DIAGNOSIS — R102 Pelvic and perineal pain: Secondary | ICD-10-CM | POA: Insufficient documentation

## 2020-10-10 DIAGNOSIS — Z9889 Other specified postprocedural states: Secondary | ICD-10-CM

## 2020-10-10 DIAGNOSIS — R1032 Left lower quadrant pain: Secondary | ICD-10-CM | POA: Insufficient documentation

## 2020-10-10 DIAGNOSIS — N926 Irregular menstruation, unspecified: Secondary | ICD-10-CM

## 2020-10-10 LAB — POCT URINALYSIS DIPSTICK
Bilirubin, UA: NEGATIVE
Glucose, UA: NEGATIVE
Ketones, UA: NEGATIVE
Leukocytes, UA: NEGATIVE
Nitrite, UA: NEGATIVE
Protein, UA: NEGATIVE
Spec Grav, UA: 1.015 (ref 1.010–1.025)
Urobilinogen, UA: 0.2 E.U./dL
pH, UA: 5 (ref 5.0–8.0)

## 2020-10-10 MED ORDER — AMOXICILLIN-POT CLAVULANATE 875-125 MG PO TABS: 1.0000 | ORAL_TABLET | Freq: Two times a day (BID) | ORAL | 0 refills | Status: DC

## 2020-10-10 NOTE — Progress Notes (Signed)
Moderate stool burden throughout the colon- increase fiber, ok to try Miralax per package instructions as well.  No kidney stone seen.  If any symptoms worsen go to the ER.

## 2020-10-10 NOTE — Patient Instructions (Signed)
Pelvic Pain, Female Pelvic pain is pain in your lower belly (abdomen), below your belly button and between your hips. The pain may start suddenly (be acute), keep coming back (be recurring), or last a long time (become chronic). Pelvic pain that lasts longer than 6 months is called chronic pelvic pain. There are many causes of pelvic pain. Sometimes the cause of pelvic pain is not known. Follow these instructions at home:   Take over-the-counter and prescription medicines only as told by your doctor.  Rest as told by your doctor.  Do not have sex if it hurts.  Keep a journal of your pelvic pain. Write down: ? When the pain started. ? Where the pain is located. ? What seems to make the pain better or worse, such as food or your period (menstrual cycle). ? Any symptoms you have along with the pain.  Keep all follow-up visits as told by your doctor. This is important. Contact a doctor if:  Medicine does not help your pain.  Your pain comes back.  You have new symptoms.  You have unusual discharge or bleeding from your vagina.  You have a fever or chills.  You are having trouble pooping (constipation).  You have blood in your pee (urine) or poop (stool).  Your pee smells bad.  You feel weak or light-headed. Get help right away if:  You have sudden pain that is very bad.  Your pain keeps getting worse.  You have very bad pain and also have any of these symptoms: ? A fever. ? Feeling sick to your stomach (nausea). ? Throwing up (vomiting). ? Being very sweaty.  You pass out (lose consciousness). Summary  Pelvic pain is pain in your lower belly (abdomen), below your belly button and between your hips.  There are many possible causes of pelvic pain.  Keep a journal of your pelvic pain. This information is not intended to replace advice given to you by your health care provider. Make sure you discuss any questions you have with your health care provider. Document  Revised: 05/04/2018 Document Reviewed: 05/04/2018 Elsevier Patient Education  [place].

## 2020-10-10 NOTE — Progress Notes (Addendum)
Established patient visit   Patient: Betty Rodriguez   DOB: 25-Aug-1971   49 y.o. Female  MRN: [id number] Visit Date: 10/10/2020  Today's healthcare provider: Marcille Buffy, FNP   Chief Complaint  Patient presents with  . Follow-up   Subjective    HPI  Follow up for Cervicitis/ LLQ pain   The patient was last seen for this 1 weeks ago. Changes made at last visit include ordering: CBC with Differential/Platelet, NuSwab Vaginitis Plus (VG+), Comprehensive Metabolic Panel (CMET), Urine Culture, doxycycline (VIBRA-TABS) 100 MG tablet, fluconazole (DIFLUCAN) 150 MG tablet, POCT urinalysis dipstick . Patient was started on Doxycycline 163m BID  She reports pain has improved some since last visit in LLQ.  Bowels are within normal, denies any blood, pain or mucous.   She did notice passage of some " pink like tissue " x 2 scant amount. Denies any bright red bleeding.   Cologuard was normal.   She did not follow up with gynecology.  Denies vaginal trauma or any intercourse/ foreign body.  She reports good compliance with treatment. She feels that condition is Improved but notices still present the past two days LLQ .Not taking Tylenol like she was.  She is having side effects. Patient reported G.I upset mild with antibiotic doxycycline .  All symptoms started after colposcopy.  Patient  denies any fever, body aches,chills, rash, chest pain, shortness of breath, nausea, vomiting, or diarrhea.  Denies dizziness, lightheadedness, pre syncopal or syncopal episodes.     -----------------------------------------------------------------------------------------   Patient Active Problem List   Diagnosis Date Noted  . LLQ pain 10/10/2020  . History of colposcopy with cervical biopsy 10/04/2020  . Cervicitis 10/04/2020  . Pelvic pain 10/04/2020  . Gastroesophageal reflux disease 04/10/2020  . Irregular menstrual cycle 05/23/2018  . Atrial tachycardia ([facility] 10/08/2017   . SVT (supra[place]ricular tachycardia) ([facility] 04/21/2017  . Hypertension 04/19/2017  . Generalized anxiety disorder 02/21/2016  . 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 History of nephrolithiasis 11/28/2015   Past Medical History:  Diagnosis Date  . Allergy   . Anxiety   . Chicken pox   . Kidney stones   . SVT (supraventricular tachycardia) ([facility]    a. diagnosed 03/2017 in setting of hypokalemia  . Vaginal Pap smear, abnormal    Allergies  Allergen Reactions  . Sulfa Antibiotics Hives  . Zoloft [Sertraline]     SVT       Medications: Outpatient Medications Prior to Visit  Medication Sig  . albuterol (VENTOLIN HFA) 108 (90 Base) MCG/ACT inhaler Inhale 2 puffs into the lungs every 4 (four) hours as needed for wheezing or shortness of breath.  . diltiazem (CARDIZEM) 30 MG tablet Take 1 tablet (30 mg total) by mouth every 6 (six) hours as needed (palpitations).  . doxycycline (VIBRA-TABS) 100 MG tablet Take 1 tablet (100 mg total) by mouth 2 (two) times daily.  . fluticasone (FLONASE) 50 MCG/ACT nasal spray Place 2 sprays into both nostrils daily.  .Lenda KelpFE 1/20 1-20 MG-MCG tablet Take 1 tablet by mouth daily. Take 4 packs continuously and then break for 1 week  . metoprolol succinate (TOPROL-XL) 25 MG 24 hr tablet Take 0.5 tablet (12.5 mg) by mouth once daily, you may take an extra 0.5 tablet (12.5 mg) once daily as needed for breakthrough tachycardia  . meclizine (ANTIVERT) 12.5 MG tablet Take 1 tablet (12.5 mg total) by mouth 3 (three) times daily as needed for dizziness. (Patient not taking:  Reported on 10/10/2020)  . valACYclovir (VALTREX) 1000 MG tablet Take 1 tablet (1,000 mg total) by mouth 2 (two) times daily. for 3 days as needed (Patient not taking: Reported on 10/10/2020)   No facility-administered medications prior to visit.    Review of Systems  Constitutional: Negative.  Negative for activity change, appetite change, chills, diaphoresis, fatigue,  fever and unexpected weight change.  HENT: Negative.   Respiratory: Negative.   Cardiovascular: Negative.   Gastrointestinal: Positive for abdominal pain (LLQ ). Negative for abdominal distention, anal bleeding, blood in stool, constipation, diarrhea, nausea and rectal pain.  Genitourinary: Positive for pelvic pain (no worse than last visit " felt was improving " noticed twinges of pain again today . ). Negative for decreased urine volume, difficulty urinating, dyspareunia, dysuria, enuresis, flank pain, frequency, genital sores, hematuria, menstrual problem, urgency, vaginal bleeding, vaginal discharge (two days ago had very small amount of pink discharge she reports; now resolved, all bleeding stopped  was having brown discharge at last visit after cystoscopy. ) and vaginal pain.       Denies any change in bowel or bladder habits. No blood or pain in urine or stool Patient  denies any fever, body aches,chills, rash, chest pain, shortness of breath, nausea, vomiting, or diarrhea.   Denies rash.  Denies mucous in stool.   Psychiatric/Behavioral: Negative.       Objective    BP 140/72   Pulse 93   Temp 98.2 F (36.8 C) (Oral)   Resp 16   Wt 132 lb 3.2 oz (60 kg)   LMP 09/13/2020   BMI 23.42 kg/m    Physical Exam Vitals reviewed.  Constitutional:      General: She is not in acute distress.    Appearance: Normal appearance. She is well-developed. She is not ill-appearing, toxic-appearing or diaphoretic.     Interventions: She is not intubated.    Comments: Patient appers well, not sickly. Speaking in complete sentences. Patient moves on and off of exam table and in room without difficulty. Gait is normal in hall and in room. Patient is oriented to person place time and situation. Patient answers questions appropriately and engages eye contact and verbal dialect with provider.    HENT:     Head: Normocephalic and atraumatic.     Right Ear: External ear normal.     Left Ear: External  ear normal.     Nose: Nose normal.     Mouth/Throat:     Pharynx: No oropharyngeal exudate.  Eyes:     General: Lids are normal. No scleral icterus.       Right eye: No discharge.        Left eye: No discharge.     Conjunctiva/sclera: Conjunctivae normal.     Right eye: Right conjunctiva is not injected. No exudate or hemorrhage.    Left eye: Left conjunctiva is not injected. No exudate or hemorrhage.    Pupils: Pupils are equal, round, and reactive to light.  Neck:     Thyroid: No thyroid mass or thyromegaly.     Vascular: Normal carotid pulses. No carotid bruit, hepatojugular reflux or JVD.     Trachea: Trachea and phonation normal. No tracheal tenderness or tracheal deviation.     Meningeal: Brudzinski's sign and Kernig's sign absent.  Cardiovascular:     Rate and Rhythm: Normal rate and regular rhythm.     Pulses: Normal pulses.          Radial pulses are 2+ on  the right side and 2+ on the left side.       Dorsalis pedis pulses are 2+ on the right side and 2+ on the left side.       Posterior tibial pulses are 2+ on the right side and 2+ on the left side.     Heart sounds: Normal heart sounds, S1 normal and S2 normal. Heart sounds not distant. No murmur heard.  No friction rub. No gallop.   Pulmonary:     Effort: Pulmonary effort is normal. No tachypnea, bradypnea, accessory muscle usage or respiratory distress. She is not intubated.     Breath sounds: Normal breath sounds. No stridor. No wheezing or rales.  Chest:     Chest wall: No tenderness.  Abdominal:     General: Bowel sounds are normal. There is no distension or abdominal bruit.     Palpations: Abdomen is soft. There is no shifting dullness, fluid wave, hepatomegaly, splenomegaly, mass or pulsatile mass.     Tenderness: There is abdominal tenderness in the left lower quadrant. There is no right CVA tenderness, left CVA tenderness, guarding or rebound. Negative signs include Murphy's sign, Rovsing's sign, McBurney's sign,  psoas sign and obturator sign.     Hernia: No hernia is present.    Musculoskeletal:        General: No tenderness or deformity. Normal range of motion.     Cervical back: Full passive range of motion without pain, normal range of motion and neck supple. No edema, erythema or rigidity. No spinous process tenderness or muscular tenderness. Normal range of motion.  Lymphadenopathy:     Head:     Right side of head: No submental, submandibular, tonsillar, preauricular, posterior auricular or occipital adenopathy.     Left side of head: No submental, submandibular, tonsillar, preauricular, posterior auricular or occipital adenopathy.     Cervical: No cervical adenopathy.     Right cervical: No superficial, deep or posterior cervical adenopathy.    Left cervical: No superficial, deep or posterior cervical adenopathy.     Upper Body:     Right upper body: No supraclavicular or pectoral adenopathy.     Left upper body: No supraclavicular or pectoral adenopathy.  Skin:    General: Skin is warm and dry.     Coloration: Skin is not pale.     Findings: No abrasion, bruising, burn, ecchymosis, erythema, lesion, petechiae or rash.     Nails: There is no clubbing.  Neurological:     Mental Status: She is alert and oriented to person, place, and time.     GCS: GCS eye subscore is 4. GCS verbal subscore is 5. GCS motor subscore is 6.     Cranial Nerves: No cranial nerve deficit.     Sensory: No sensory deficit.     Motor: No tremor, atrophy, abnormal muscle tone or seizure activity.     Coordination: Coordination normal.     Gait: Gait normal.     Deep Tendon Reflexes: Reflexes are normal and symmetric. Reflexes normal. Babinski sign absent on the right side. Babinski sign absent on the left side.     Reflex Scores:      Tricep reflexes are 2+ on the right side and 2+ on the left side.      Bicep reflexes are 2+ on the right side and 2+ on the left side.      Brachioradialis reflexes are 2+ on the  right side and 2+ on the left side.  Patellar reflexes are 2+ on the right side and 2+ on the left side.      Achilles reflexes are 2+ on the right side and 2+ on the left side. Psychiatric:        Speech: Speech normal.        Behavior: Behavior normal.        Thought Content: Thought content normal.        Judgment: Judgment normal.      Results for orders placed or performed in visit on 10/10/20  POCT urinalysis dipstick  Result Value Ref Range   Color, UA yellow    Clarity, UA clear    Glucose, UA Negative Negative   Bilirubin, UA negative    Ketones, UA negative    Spec Grav, UA 1.015 1.010 - 1.025   Blood, UA large    pH, UA 5.0 5.0 - 8.0   Protein, UA Negative Negative   Urobilinogen, UA 0.2 0.2 or 1.0 E.U./dL   Nitrite, UA negative    Leukocytes, UA Negative Negative   Appearance     Odor      Assessment & Plan     LLQ pain - Plan: DG Abd 1 View, Ambulatory referral to Obstetrics / Gynecology, POCT urinalysis dipstick, Urine Culture, Urine Microscopic, US PELVIC COMPLETE WITH TRANSVAGINAL, CANCELED: US PELVIC COMPLETE WITH TRANSVAGINAL  Pelvic pain - Plan: DG Abd 1 View, Ambulatory referral to Obstetrics / Gynecology, US PELVIC COMPLETE WITH TRANSVAGINAL, CANCELED: US PELVIC COMPLETE WITH TRANSVAGINAL  History of colposcopy with cervical biopsy - Plan: Ambulatory referral to Obstetrics / Gynecology  Rule out kidney stone- hematuria on POCT urine, constipation with KUB.  Will do pelvic ultrasound.  Orders Placed This Encounter  Procedures  . Urine Culture  . DG Abd 1 View  . US PELVIC COMPLETE WITH TRANSVAGINAL  . Urine Microscopic  . Ambulatory referral to Obstetrics / Gynecology  . POCT urinalysis dipstick  urgent referral to Gynecology since she did not follow up with Dr. Barnett Abu. She will need gynecology with history.  Red Flags discussed. The patient was given clear instructions to go to ER or return to medical center if any red flags develop, symptoms  do not improve, worsen or new problems develop. They verbalized understanding.    Return if symptoms worsen or fail to improve, for at any time for any worsening symptoms, Go to Emergency room/ urgent care if worse.     Addressed acute and or chronic medical problems today requiring 40 minutes reviewing patients medical record,labs, counseling patient regarding patient's conditions, any medications, answering questions regarding health, and coordination of care as needed. After visit summary patient given copy and reviewed.    Marcille Buffy, [place] [phone] (phone) [phone] (fax)  [place]

## 2020-10-10 NOTE — Progress Notes (Signed)
Urine microscopic and culture. KUB rule out stone.

## 2020-10-11 ENCOUNTER — Encounter: Payer: Self-pay | Admitting: Adult Health

## 2020-10-11 ENCOUNTER — Telehealth: Payer: Self-pay

## 2020-10-11 ENCOUNTER — Other Ambulatory Visit: Payer: Self-pay | Admitting: Adult Health

## 2020-10-11 MED ORDER — JUNEL FE 1/20 1-20 MG-MCG PO TABS: 1.0000 | ORAL_TABLET | Freq: Every day | ORAL | 1 refills | Status: DC

## 2020-10-11 NOTE — Progress Notes (Signed)
Small lesion seen on cervix up to 8 mm in size, radiologist notes this is possibility a nabothian cyst or small cervical mass. No bleeding or free fluid in the pelvis seen and otherwise negative.  She has already been referred to [facility], she is post colposcopy that showed inflammation  and need to follow with gynecologist. You should hear with an appointment for next week.  Likely pain in LLQ is related to stool burden/ constipation seen on x ray- treatment as already discussed.  I also recommend pelvic rest, no intercourse, tampons or anything vaginal until seeing gynecologist.  Go to ER over the weekend if any symptoms worsen or change at anytime.

## 2020-10-11 NOTE — Telephone Encounter (Unsigned)
Copied from [place] [id number]. Topic: General - Other >> Oct 11, 2020  1:30 PM Leward Quan A wrote: Reason for CRM: Patient called in to inform Laverna Peace that [facility] can not see her until March of 2022 asking if there is someone else that could see her sooner. Ph# [phone]

## 2020-10-11 NOTE — Telephone Encounter (Signed)
This patient called in stating that she was sent in a referral to see [place] and stated that she wanted to see Dr. Marcelline Mates. Informed patient of the wait that Dr. Marcelline Mates has, patient still would like to see Dr. Marcelline Mates. This patients referral was placed as urgent, is there anywhere you would like me place her or what would you like me to do? I scheduled her for March of next year, and added her to the wait list per her request.

## 2020-10-12 LAB — URINALYSIS, MICROSCOPIC ONLY: Casts: NONE SEEN /lpf

## 2020-10-13 LAB — URINE CULTURE

## 2020-10-14 ENCOUNTER — Other Ambulatory Visit: Payer: Self-pay

## 2020-10-14 ENCOUNTER — Other Ambulatory Visit: Payer: Self-pay | Admitting: Adult Health

## 2020-10-14 ENCOUNTER — Ambulatory Visit
Admission: RE | Admit: 2020-10-14 | Discharge: 2020-10-14 | Disposition: A | Discharge status: Home / Self Care | Payer: Managed Care, Other (non HMO) | Source: Ambulatory Visit | Attending: Adult Health | Admitting: Adult Health

## 2020-10-14 DIAGNOSIS — Z9889 Other specified postprocedural states: Secondary | ICD-10-CM | POA: Diagnosis present

## 2020-10-14 DIAGNOSIS — R1032 Left lower quadrant pain: Secondary | ICD-10-CM | POA: Diagnosis not present

## 2020-10-14 DIAGNOSIS — R102 Pelvic and perineal pain: Secondary | ICD-10-CM | POA: Insufficient documentation

## 2020-10-14 MED ORDER — IOHEXOL 300 MG/ML  SOLN
100.0000 mL | Freq: Once | INTRAMUSCULAR | Status: AC | PRN
  Administered 2020-10-14: 100 mL via INTRAVENOUS

## 2020-10-14 NOTE — Telephone Encounter (Signed)
The referral was for LLQ pain Pelvic pain, History of colposcopy with cervical biopsy.

## 2020-10-14 NOTE — Progress Notes (Signed)
Normal CT of abdomen, this is reassuring.

## 2020-10-14 NOTE — Telephone Encounter (Signed)
No, a referral should not have to wait 3 months, especially if it was urgent. I can see her at 3:45 on Dec 1.

## 2020-10-15 ENCOUNTER — Encounter: Payer: Self-pay | Admitting: Obstetrics and Gynecology

## 2020-10-15 ENCOUNTER — Ambulatory Visit: Admission: RE | Admit: 2020-10-15 | Payer: Managed Care, Other (non HMO) | Source: Ambulatory Visit

## 2020-10-15 ENCOUNTER — Ambulatory Visit: Payer: Managed Care, Other (non HMO) | Admitting: Obstetrics and Gynecology

## 2020-10-15 VITALS — BP 145/92 | HR 84 | Ht 63.0 in | Wt 130.6 lb

## 2020-10-15 DIAGNOSIS — N939 Abnormal uterine and vaginal bleeding, unspecified: Secondary | ICD-10-CM | POA: Diagnosis not present

## 2020-10-15 DIAGNOSIS — R102 Pelvic and perineal pain: Secondary | ICD-10-CM | POA: Diagnosis not present

## 2020-10-15 DIAGNOSIS — Z8742 Personal history of other diseases of the female genital tract: Secondary | ICD-10-CM | POA: Diagnosis not present

## 2020-10-15 MED ORDER — IBUPROFEN 800 MG PO TABS
800.0000 mg | ORAL_TABLET | Freq: Three times a day (TID) | ORAL | 1 refills | Status: DC | PRN
Start: 2020-10-15 — End: 2021-08-28

## 2020-10-15 MED ORDER — GABAPENTIN 300 MG PO CAPS
300.0000 mg | ORAL_CAPSULE | Freq: Three times a day (TID) | ORAL | 1 refills | Status: DC | PRN
Start: 2020-10-15 — End: 2021-08-28

## 2020-10-15 NOTE — Progress Notes (Signed)
GYNECOLOGY PROGRESS NOTE  Subjective:    Patient ID: Betty Rodriguez, female    DOB: June 25, 1971, 49 y.o.   MRN: [id number]  HPI  Patient is a 49 y.o. [phone] female who presents as a referral from her PCP for pelvic pain. She is accompanied by her partner today. Patient reports that she had an abnormal pap smear, for which she was referred to a GYN (was seen at [facility] in [place]). She had a cervical biopsy performed on November 1st. During the  Procedure there were issues with bleeding requiring use of "several medications". Betty Rodriguez reports that since that time she has had pain on left side. Pain was so significant the first day after the biopsy that she couldn't go to work. She had bleeding for the first week which began to slow down. She states that she followed up with her  PCP last week who noted some "bruising of her cervix".  Was also concerned for a possible infection and prescribed Doxycyline and collected blood work and vaginal swabs.  Ordered X-ray and pelvic ultrasound (Thursday) due to her persistent pain which were normal (except small cyst on cervix).  On Saturday she began noting more bright red bleeding on into half the day Sunday. It changed back to a dark brown discharge starting Monday and has been this way for the past few days.  Her PCP then ordered a CT scan of her abdomen due to recurrence of the bleeding and pain, which was performed yesterday (has not gotten these results yet).  Uses OTC Ibuprofen or Tylenol which helps for a little while. Laying down also helps.   Of note, she is currently on OCPs continuously for contraception (3 months on/1 week off). Has been on current regimen for 2 years.   Past Medical History:  Diagnosis Date  . Allergy   . Anxiety   . Chicken pox   . Kidney stones   . SVT (supraventricular tachycardia) ([place])    a. diagnosed 03/2017 in setting of hypokalemia  . Vaginal Pap smear, abnormal      Past Medical  History:  Diagnosis Date  . Allergy   . Anxiety   . Chicken pox   . Kidney stones   . SVT (supraventricular tachycardia) ([place])    a. diagnosed 03/2017 in setting of hypokalemia  . Vaginal Pap smear, abnormal     Family History  Problem Relation Age of Onset  . Healthy Mother   . Hyperlipidemia Father   . Hypertension Father   . Diabetes Maternal Grandmother   . Hypertension Maternal Grandmother   . Heart disease Maternal Grandmother   . Stroke Maternal Grandmother 22  . Diabetes Maternal Grandfather   . Hypertension Maternal Grandfather   . Heart disease Maternal Grandfather   . Multiple sclerosis Paternal Grandmother   . Breast cancer Paternal Aunt   . Colon cancer Neg Hx   . Ovarian cancer Neg Hx     Past Surgical History:  Procedure Laterality Date  . BREAST BIOPSY Right 07/06/2016   us/ core/neg-BENIGN BREAST TISSUE   . TONSILLECTOMY  1983    Social History   Socioeconomic History  . Marital status: Divorced    Spouse name: Not on file  . Number of children: 3  . Years of education: Not on file  . Highest education level: Not on file  Occupational History  . Occupation: Microbiologist  Tobacco Use  . Smoking status: Never Smoker  . Smokeless tobacco: Never  Used  Vaping Use  . Vaping Use: Never used  Substance and Sexual Activity  . Alcohol use: Yes    Alcohol/week: 0.0 standard drinks    Comment: Rare   . Drug use: No  . Sexual activity: Yes    Partners: Male    Birth control/protection: OCP    Comment: 1 partner   Other Topics Concern  . 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 Difficulty of Paying Living Expenses: Not on file  Food Insecurity:   . Worried About Charity fundraiser  in the Last Year: Not on file  . Ran Out of Food in the Last Year: Not on file  Transportation Needs:   . Lack of Transportation (Medical): Not on file  . Lack of Transportation (Non-Medical): Not on file  Physical Activity:   . Days of Exercise per Week: Not on file  . Minutes of Exercise per Session: Not on file  Stress:   . Feeling of Stress : Not on file  Social Connections:   . Frequency of Communication with Friends and Family: Not on file  . Frequency of Social Gatherings with Friends and Family: Not on file  . Attends Religious Services: Not on file  . Active Member of Clubs or Organizations: Not on file  . Attends Archivist Meetings: Not on file  . Marital Status: Not on file  Intimate Partner Violence:   . Fear of Current or Ex-Partner: Not on file  . Emotionally Abused: Not on file  . Physically Abused: Not on file  . Sexually Abused: Not on file    Allergies  Allergen Reactions  . Sulfa Antibiotics Hives  . Zoloft [Sertraline]     SVT    Current Outpatient Medications on File Prior to Visit  Medication Sig Dispense Refill  . albuterol (VENTOLIN HFA) 108 (90 Base) MCG/ACT inhaler Inhale 2 puffs into the lungs every 4 (four) hours as needed for wheezing or shortness of breath. 18 g 0  . diltiazem (CARDIZEM) 30 MG tablet Take 1 tablet (30 mg total) by mouth every 6 (six) hours as needed (palpitations). 60 tablet 3  . fluticasone (FLONASE) 50 MCG/ACT nasal spray Place 2 sprays into both nostrils daily. 16 g 6  . JUNEL FE 1/20 1-20 MG-MCG tablet Take 1 tablet by mouth daily. Take 4 packs continuously and then break for 1 week 112 tablet 1  . meclizine (ANTIVERT) 12.5 MG tablet Take 1 tablet (12.5 mg total) by mouth 3 (three) times daily as needed for dizziness. 30 tablet 0  . metoprolol succinate (TOPROL-XL) 25 MG 24 hr tablet Take 0.5 tablet (12.5 mg) by mouth once daily, you may take an extra 0.5 tablet (12.5 mg) once daily as needed for breakthrough  tachycardia 90 tablet 3  . valACYclovir (VALTREX) 1000 MG tablet Take 1 tablet (1,000 mg total) by mouth 2 (two) times daily. for 3 days as needed 20 tablet 2  . amoxicillin-clavulanate (AUGMENTIN) 875-125 MG tablet Take 1 tablet by mouth 2 (two) times daily. 20 tablet 0  . doxycycline (VIBRA-TABS) 100 MG tablet Take 1 tablet (100 mg total) by mouth 2 (two) times  daily. (Patient not taking: Reported on 10/15/2020) 20 tablet 0   No current facility-administered medications on file prior to visit.      Review of Systems Pertinent items noted in HPI and remainder of comprehensive ROS otherwise negative.   Objective:   Blood pressure (!) 145/92, pulse 84, height 5' 3"  (1.6 m), weight 130 lb 9.6 oz (59.2 kg). General appearance: alert and no distress Abdomen: normal findings: no masses palpable, no organomegaly and soft and abnormal findings:  mild tenderness in the LLQ Pelvic: external genitalia normal, rectovaginal septum normal.  Vagina with moderate amount of dark brown discharge.  Cervix normal appearing, nabothian cyst at 9-10 o'clock. No motion tenderness.  Uterus mobile, nontender, normal shape and size.  Adnexae non-palpable, nontender bilaterally.  Extremities: extremities normal, atraumatic, no cyanosis or edema Neurologic: Grossly normal    Imaging:  CT Abdomen Pelvis W Contrast CLINICAL DATA:  Left lower quadrant abdominopelvic pain. Hematuria. Recent colposcopy  EXAM: CT ABDOMEN AND PELVIS WITH CONTRAST  TECHNIQUE: Multidetector CT imaging of the abdomen and pelvis was performed using the standard protocol following bolus administration of intravenous contrast.  CONTRAST:  153m OMNIPAQUE IOHEXOL 300 MG/ML  SOLN  COMPARISON:  Pelvic sonogram 10/10/2020  FINDINGS: Lower chest: No acute abnormality.  Hepatobiliary: No focal liver abnormality is seen. No gallstones, gallbladder wall thickening, or biliary dilatation.  Pancreas: Unremarkable. No pancreatic ductal  dilatation or surrounding inflammatory changes.  Spleen: Normal in size without focal abnormality.  Adrenals/Urinary Tract: The adrenal glands are within normal limits. No kidney mass or hydronephrosis identified bilaterally.The urinary bladder is unremarkable. No urinary tract calculi identified  Stomach/Bowel: Stomach appears normal. The appendix is visualized and appears normal. No bowel wall thickening, inflammation, or distension.  Vascular/Lymphatic: Normal appearance of the abdominal aorta. No abdominopelvic adenopathy identified.  Reproductive: Uterus and bilateral adnexa are unremarkable.  Other: No free fluid or fluid collections.  Musculoskeletal: No acute or significant osseous findings.  IMPRESSION: 1. No acute findings and no explanation for patient's lower abdominal and pelvic pain.  Electronically Signed   By: TKerby MoorsM.D.   On: 10/14/2020 14:43     ADDENDUM: 1. Request made for additional recommendations regarding imaging. Correlation with Pap smear may be performed for possible cervical lesion. 2. Regarding history of left lower quadrant pain after cystoscopy, correlation with CT may be obtained depending on level of clinical concern.   Electronically Signed   By: KDonavan FoilM.D.   On: 10/11/2020 15:08   Signed by FMadie Reno MD on 10/11/2020 3:11 PM  Narrative & Impression  CLINICAL DATA:  Pain after colposcopy  EXAM: TRANSABDOMINAL AND TRANSVAGINAL ULTRASOUND OF PELVIS  TECHNIQUE: Both transabdominal and transvaginal ultrasound examinations of the pelvis were performed. Transabdominal technique was performed for global imaging of the pelvis including uterus, ovaries, adnexal regions, and pelvic cul-de-sac. It was necessary to proceed with endovaginal exam following the transabdominal exam to visualize the uterus endometrium ovaries.  COMPARISON:  None  FINDINGS: Uterus  Measurements: 8 x 4 x 5.4 cm = volume:  92 mL. Complex nabothian cyst in the cervix versus small cervical soft tissue nodule, measuring up to 8 mm in size.  Endometrium  Thickness: 2.5 mm.  No focal abnormality visualized.  Right ovary  Measurements: 2 x 1.1 x 1.4 cm = volume: 2 mL. Normal appearance/no adnexal mass.  Left ovary  Measurements: 1.9 x 1.3 x 1.2 cm = volume: 2 mL. Normal appearance/no adnexal mass.  Other findings  No abnormal  free fluid.  IMPRESSION: 1. Small hypoechoic lesion in the cervix measuring up to 8 mm in size, either representing complicated nabothian cyst or small cervical mass. 2. Otherwise negative pelvic ultrasound.  Electronically Signed: By: Donavan Foil M.D. On: 10/10/2020 17:47     Assessment:   Pelvic pain Vaginal bleeding History of abnormal cervical Pap smear  Plan:    1. Pelvic pain - Discussed potential cause of pelvic pain, likely secondary to recent biopsy for abnormal cervix.  as timeline coincides with recent procedure.  All imaging noted normal reproductive structures without masses (except Nabothian cyst noted on cervix).  Exam today correlates with findings. Has been taking OTC pain meds, will prescribe 800 mg of Ibuprofen to take for a short time to help with pain. Also discussed possibility of some intermittent neuropathy as she feels sharp shooting pains. Offered short trial of Gabapentin for discomfort. Also discussed pelvic exercises and LLQ massage to help ease discomfort. Overall I do believe that her symptoms will be short-lived, offered reassurance.  2. Vaginal bleeding - has progressed now to dark brown discharge, likely old blood. Cervix appears to be well healed, no evidence of bleeding from previous biopsy site. I did discuss the possibility of her continuous use OCPs occasionally causing some irregular bleeding episodes, especially in light of the significant events that have occurred recently could have triggered a uterine response to elicit  bleeding.  3. Abnormal pap smear - biopsy results reviewed, benign.  Did not some inflammation, for which patient was recently treated with doxycycline.  Discussed that use of antibiotics could also lead to some abnormal bleeding when on OCPs.  Advised not to take the course of Augmentin that was also prescribed.  Continue to f/u with pap smear again in 1 year.   RTC in 2 weeks, will continue to f/u with symptoms.   Rubie Maid, MD [facility]

## 2020-10-15 NOTE — Telephone Encounter (Signed)
Saw Dr. Marcelline Mates had an opening for today, patient was scheduled for 11/16 @ 10:00 AM. Dr. Marcelline Mates had replied back to me after I had called patient to move her up.

## 2020-10-15 NOTE — Progress Notes (Signed)
Pt present today due to referral for pelvic pain. Pt stated having a abnormal pap smear and had a bx done since then having a lot of vaginal bleeding and left side abd/pelvic pain. Pt stated having blood in urine at one time but have not noticed it since then.

## 2020-10-17 ENCOUNTER — Ambulatory Visit: Payer: Self-pay | Admitting: Adult Health

## 2020-10-30 ENCOUNTER — Encounter: Payer: Managed Care, Other (non HMO) | Admitting: Obstetrics and Gynecology

## 2020-11-12 ENCOUNTER — Ambulatory Visit
Admission: RE | Admit: 2020-11-12 | Discharge: 2020-11-12 | Disposition: A | Discharge status: Home / Self Care | Payer: Managed Care, Other (non HMO) | Source: Ambulatory Visit | Attending: Physician Assistant | Admitting: Physician Assistant

## 2020-11-12 ENCOUNTER — Other Ambulatory Visit: Payer: Self-pay

## 2020-11-12 DIAGNOSIS — Z1231 Encounter for screening mammogram for malignant neoplasm of breast: Secondary | ICD-10-CM | POA: Diagnosis present

## 2020-11-12 DIAGNOSIS — Z1239 Encounter for other screening for malignant neoplasm of breast: Secondary | ICD-10-CM

## 2020-11-15 ENCOUNTER — Encounter: Payer: Self-pay | Admitting: Physician Assistant

## 2020-11-18 ENCOUNTER — Other Ambulatory Visit: Payer: Self-pay | Admitting: Physician Assistant

## 2020-11-18 DIAGNOSIS — N6310 Unspecified lump in the right breast, unspecified quadrant: Secondary | ICD-10-CM

## 2020-11-18 DIAGNOSIS — R928 Other abnormal and inconclusive findings on diagnostic imaging of breast: Secondary | ICD-10-CM

## 2020-12-02 ENCOUNTER — Other Ambulatory Visit: Payer: Self-pay

## 2020-12-02 ENCOUNTER — Ambulatory Visit
Admission: RE | Admit: 2020-12-02 | Discharge: 2020-12-02 | Disposition: A | Discharge status: Home / Self Care | Payer: Managed Care, Other (non HMO) | Source: Ambulatory Visit | Attending: Physician Assistant | Admitting: Physician Assistant

## 2020-12-02 DIAGNOSIS — R928 Other abnormal and inconclusive findings on diagnostic imaging of breast: Secondary | ICD-10-CM

## 2020-12-02 DIAGNOSIS — N6310 Unspecified lump in the right breast, unspecified quadrant: Secondary | ICD-10-CM

## 2020-12-03 ENCOUNTER — Other Ambulatory Visit: Payer: Self-pay | Admitting: Physician Assistant

## 2020-12-03 ENCOUNTER — Encounter: Payer: Self-pay | Admitting: Family Medicine

## 2020-12-03 DIAGNOSIS — R928 Other abnormal and inconclusive findings on diagnostic imaging of breast: Secondary | ICD-10-CM

## 2020-12-03 DIAGNOSIS — N6310 Unspecified lump in the right breast, unspecified quadrant: Secondary | ICD-10-CM

## 2020-12-03 DIAGNOSIS — N6001 Solitary cyst of right breast: Secondary | ICD-10-CM

## 2020-12-04 ENCOUNTER — Encounter: Payer: Managed Care, Other (non HMO) | Admitting: Family Medicine

## 2020-12-04 ENCOUNTER — Encounter: Payer: Self-pay | Admitting: Physician Assistant

## 2020-12-04 NOTE — Telephone Encounter (Signed)
Yes they were signed yesterday for her.

## 2020-12-09 ENCOUNTER — Ambulatory Visit
Admission: RE | Admit: 2020-12-09 | Discharge: 2020-12-09 | Disposition: A | Discharge status: Home / Self Care | Payer: Managed Care, Other (non HMO) | Source: Ambulatory Visit | Attending: Physician Assistant | Admitting: Physician Assistant

## 2020-12-09 ENCOUNTER — Other Ambulatory Visit: Payer: Self-pay

## 2020-12-09 DIAGNOSIS — N6311 Unspecified lump in the right breast, upper outer quadrant: Secondary | ICD-10-CM | POA: Insufficient documentation

## 2020-12-09 DIAGNOSIS — R928 Other abnormal and inconclusive findings on diagnostic imaging of breast: Secondary | ICD-10-CM | POA: Insufficient documentation

## 2020-12-09 DIAGNOSIS — N6001 Solitary cyst of right breast: Secondary | ICD-10-CM | POA: Diagnosis not present

## 2020-12-09 HISTORY — PX: US GUIDED CYST ASP BREAST R (ARMC HX): HXRAD1163

## 2020-12-18 ENCOUNTER — Encounter: Payer: Self-pay | Admitting: Physician Assistant

## 2020-12-18 DIAGNOSIS — N926 Irregular menstruation, unspecified: Secondary | ICD-10-CM

## 2020-12-18 MED ORDER — JUNEL FE 1/20 1-20 MG-MCG PO TABS: 1.0000 | ORAL_TABLET | Freq: Every day | ORAL | 1 refills | Status: DC

## 2020-12-23 MED ORDER — JUNEL FE 1/20 1-20 MG-MCG PO TABS: 1.0000 | ORAL_TABLET | Freq: Every day | ORAL | 1 refills | Status: DC

## 2020-12-23 NOTE — Addendum Note (Signed)
Addended by: Ashley Royalty E on: 12/23/2020 07:25 AM   Modules accepted: Orders

## 2021-01-09 IMAGING — US US PELVIS COMPLETE WITH TRANSVAGINAL
1 series · 13 of 25 positions shown · non-contrast
Comparison: None
COMPARISON: None

Addendum:
CLINICAL DATA: Pain after cystoscopy

EXAM:
TRANSABDOMINAL AND TRANSVAGINAL ULTRASOUND OF PELVIS
TECHNIQUE: Both transabdominal and transvaginal ultrasound examinations of the
pelvis were performed. Transabdominal technique was performed for
global imaging of the pelvis including uterus, ovaries, adnexal
regions, and pelvic cul-de-sac. It was necessary to proceed with
endovaginal exam following the transabdominal exam to visualize the
uterus endometrium ovaries.

[Series 1: us pelvic complete with transvaginal · 13 of 82 slices shown]
[im 1/82]
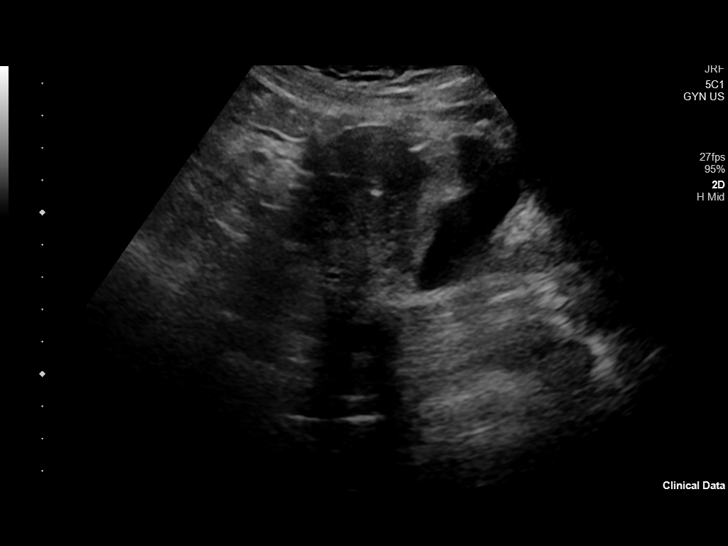
[im 7/82]
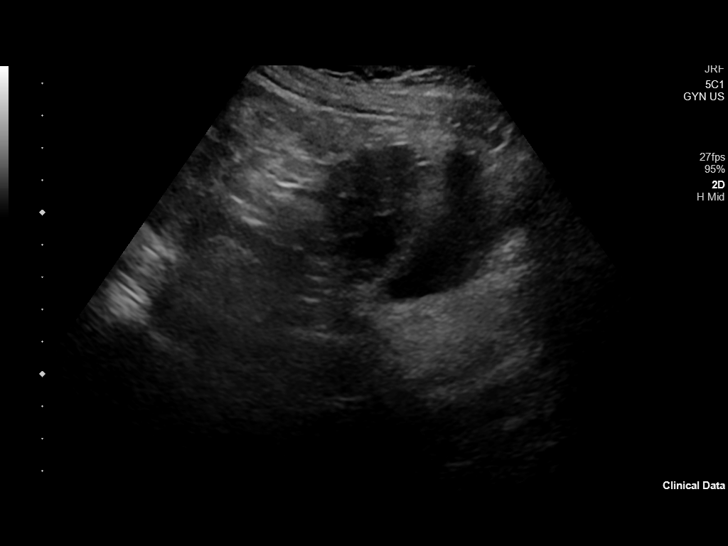
[im 14/82]
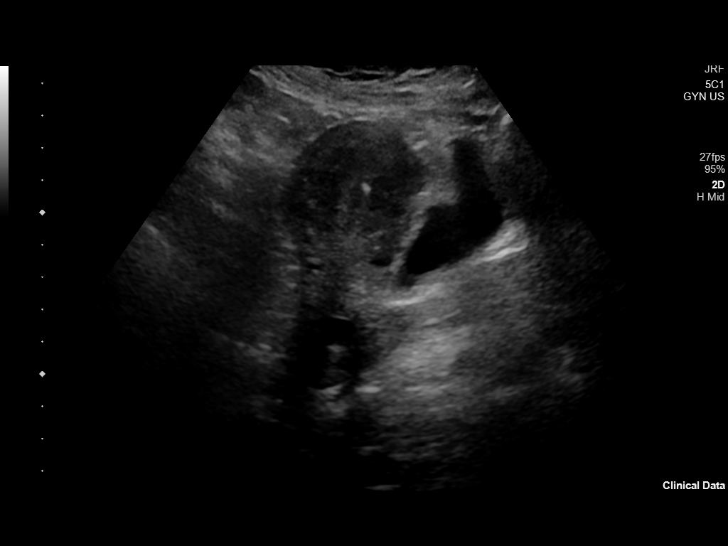
[im 21/82]
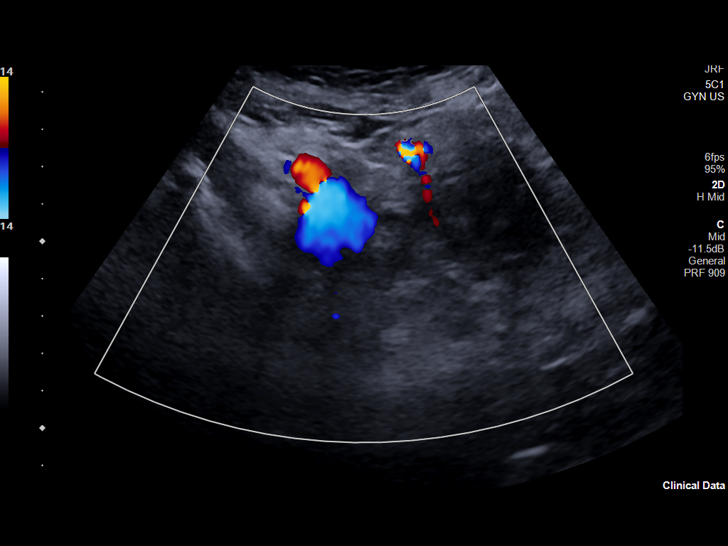
[im 28/82]
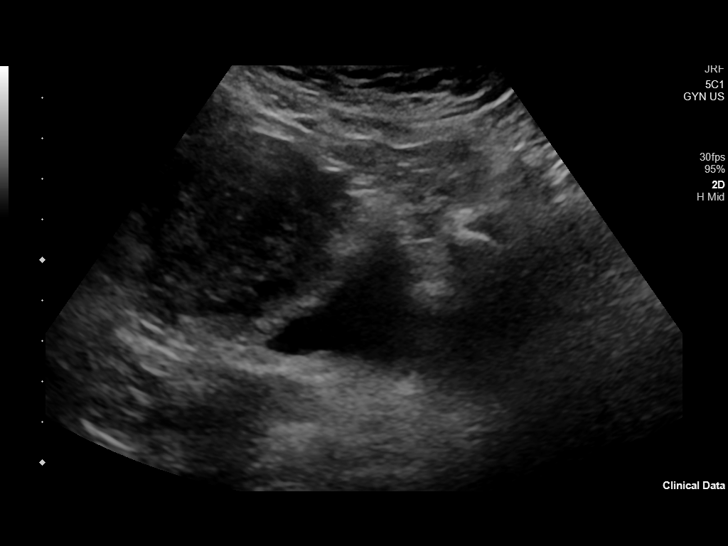
[im 34/82]
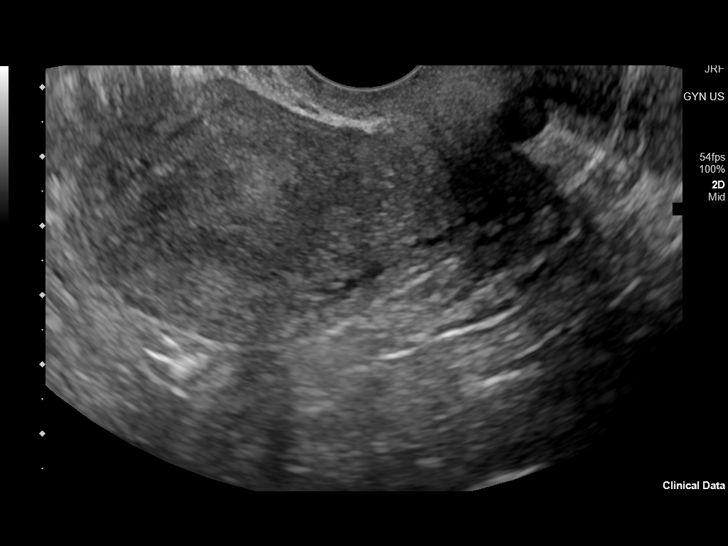
[im 41/82]
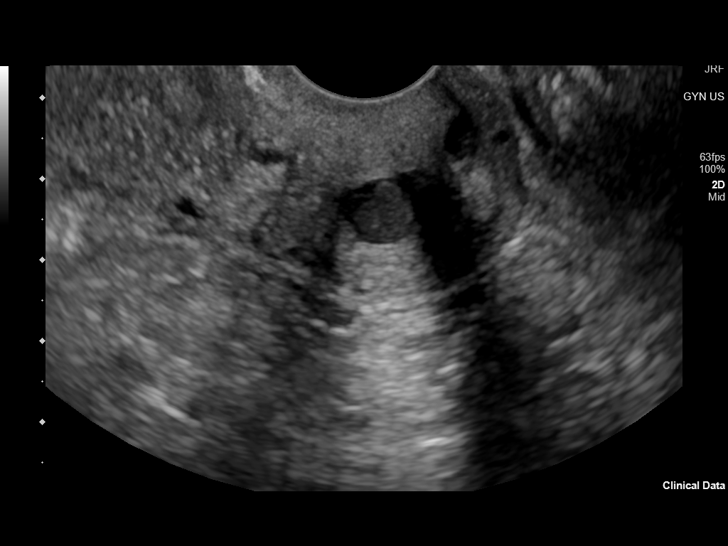
[im 48/82]
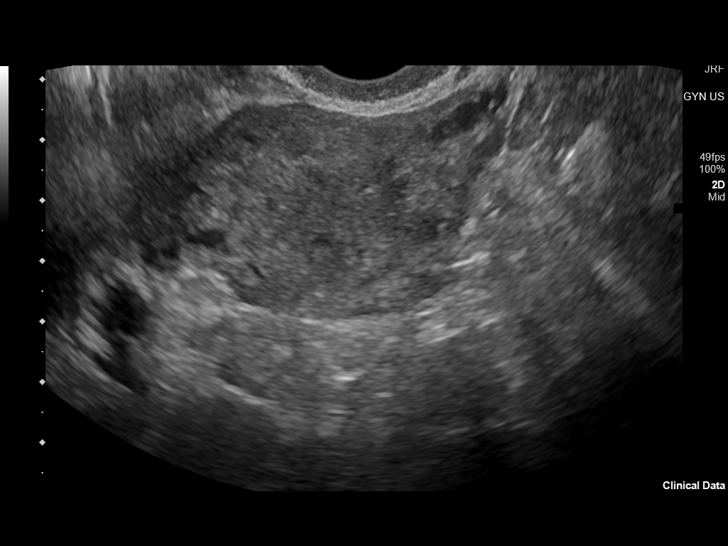
[im 55/82]
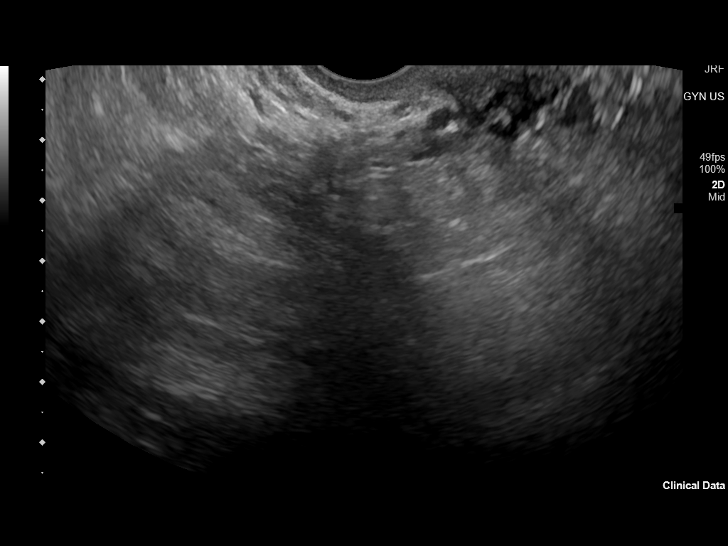
[im 61/82]
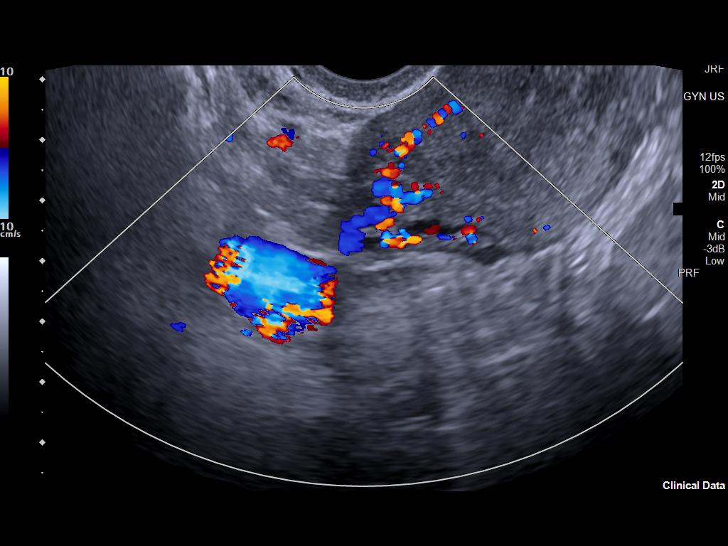
[im 68/82]
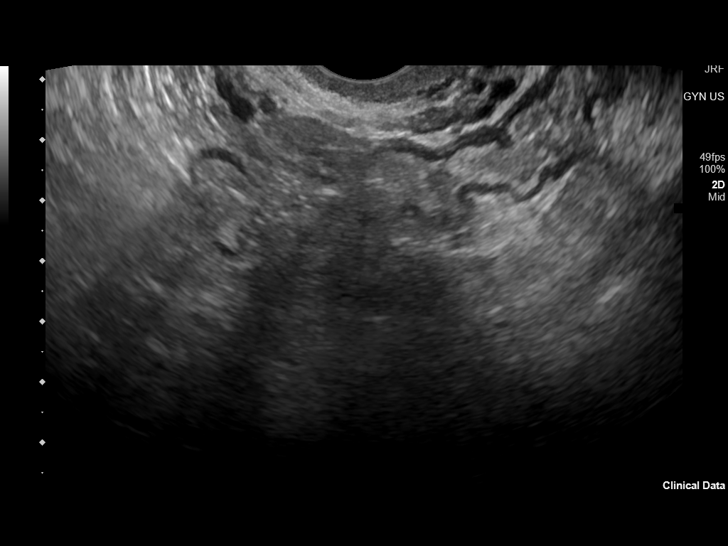
[im 75/82]
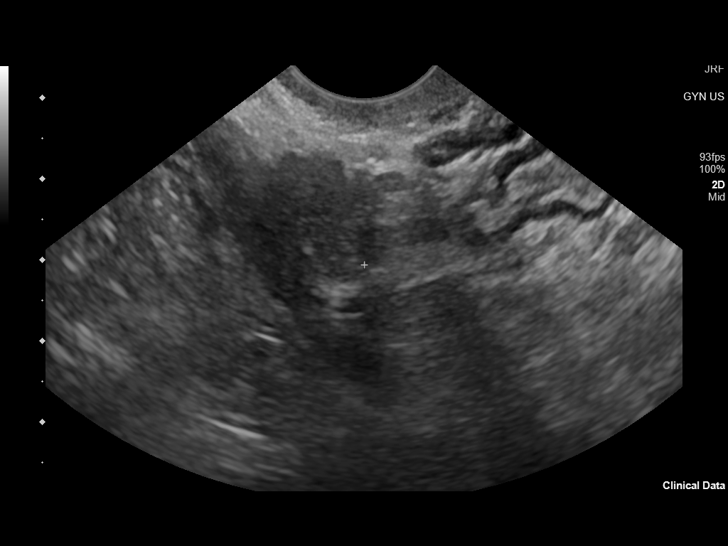
[im 82/82]
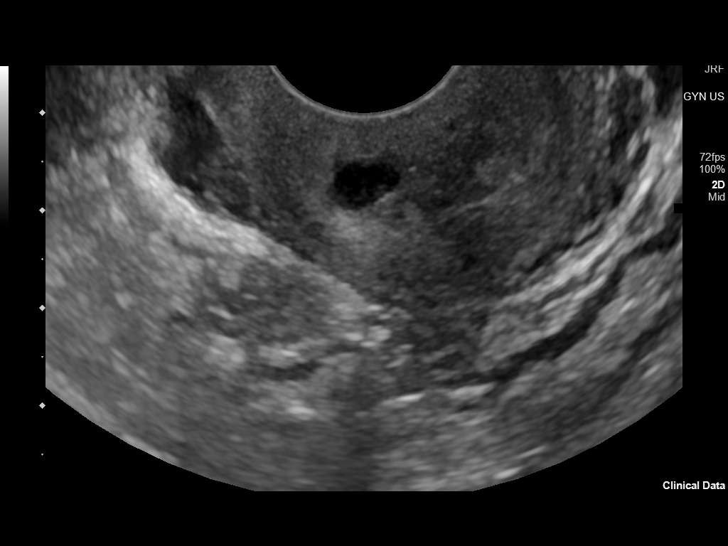

[13 of 25 positions shown; findings below may reference images not displayed]

FINDINGS: Uterus

Measurements: 8 x 4 x 5.4 cm = volume: 92 mL. Complex nabothian cyst
in the cervix versus small cervical soft tissue nodule, measuring up
to 8 mm in size.

Endometrium

Thickness: 2.5 mm.  No focal abnormality visualized.

Right ovary

Measurements: 2 x 1.1 x 1.4 cm = volume: 2 mL. Normal appearance/no
adnexal mass.

Left ovary

Measurements: 1.9 x 1.3 x 1.2 cm = volume: 2 mL. Normal
appearance/no adnexal mass.

Other findings

No abnormal free fluid.
IMPRESSION: 1. Small hypoechoic lesion in the cervix measuring up to 8 mm in
size, either representing complicated nabothian cyst or small
cervical mass.
2. Otherwise negative pelvic ultrasound.

ADDENDUM:
1. Request made for additional recommendations regarding imaging.
Correlation with Pap smear may be performed for possible cervical
lesion.
2. Regarding history of left lower quadrant pain after cystoscopy,
correlation with CT may be obtained depending on level of clinical
concern.

*** End of Addendum ***
FINDINGS: Uterus

Measurements: 8 x 4 x 5.4 cm = volume: 92 mL. Complex nabothian cyst
in the cervix versus small cervical soft tissue nodule, measuring up
to 8 mm in size.

Endometrium

Thickness: 2.5 mm.  No focal abnormality visualized.

Right ovary

Measurements: 2 x 1.1 x 1.4 cm = volume: 2 mL. Normal appearance/no
adnexal mass.

Left ovary

Measurements: 1.9 x 1.3 x 1.2 cm = volume: 2 mL. Normal
appearance/no adnexal mass.

Other findings

No abnormal free fluid.
IMPRESSION: 1. Small hypoechoic lesion in the cervix measuring up to 8 mm in
size, either representing complicated nabothian cyst or small
cervical mass.
2. Otherwise negative pelvic ultrasound.

## 2021-01-30 ENCOUNTER — Encounter: Payer: Managed Care, Other (non HMO) | Admitting: Obstetrics and Gynecology

## 2021-04-10 ENCOUNTER — Ambulatory Visit: Payer: Self-pay | Admitting: *Deleted

## 2021-04-10 NOTE — Telephone Encounter (Signed)
Patient requesting to be seen by a female provider. Will call back tomorrow if anyone cancels.

## 2021-04-10 NOTE — Telephone Encounter (Signed)
Pt called in c/o left lower abd pain that started 2-3 weeks ago suddenly.   She is having nausea on and off is the only other symptom she is having.   No diarrhea, dark or bloody stools, urinary symptoms or vomiting. She does have a history of kidney stones and at first she thought it was that but this pain is more in the front and stomach area than her back like the kidney stone pain was.  There were no appts available within the timeframe indicated in the protocol of 24 hrs so I have sent my notes to [facility] where she sees Dr. Brita Romp.requesting they call her regarding an appt. She was agreeable to this plan.  She can be reached at [phone].  Reason for Disposition . [1] MODERATE pain (e.g., interferes with normal activities) AND [2] pain comes and goes (cramps) AND [3] present > 24 hours  (Exception: pain with Vomiting or Diarrhea - see that Guideline)  Answer Assessment - Initial Assessment Questions 1. LOCATION: "Where does it hurt?"      Having left lower abd pain.   I just need to Dr. Brita Romp. 2. RADIATION: "Does the pain shoot anywhere else?" (e.g., chest, back)     Pain for 2-3 weeks.   No radiation. 3. ONSET: "When did the pain begin?" (e.g., minutes, hours or days ago)      I thought it might be a kidney stone.   I'm having nausea too. 4. SUDDEN: "Gradual or sudden onset?"     Sudden 5. PATTERN "Does the pain come and go, or is it constant?"    - If constant: "Is it getting better, staying the same, or worsening?"      (Note: Constant means the pain never goes away completely; most serious pain is constant and it progresses)     - If intermittent: "How long does it last?" "Do you have pain now?"     (Note: Intermittent means the pain goes away completely between bouts)     Comes and goes.  6. SEVERITY: "How bad is the pain?"  (e.g., Scale 1-10; mild, moderate, or severe)   - MILD (1-3): doesn't interfere with normal activities, abdomen soft and not  tender to touch    - MODERATE (4-7): interferes with normal activities or awakens from sleep, abdomen tender to touch    - SEVERE (8-10): excruciating pain, doubled over, unable to do any normal activities      First night was the worst. 7. RECURRENT SYMPTOM: "Have you ever had this type of stomach pain before?" If Yes, ask: "When was the last time?" and "What happened that time?"      No 8. CAUSE: "What do you think is causing the stomach pain?"     Maybe kidney stone but not sure 9. RELIEVING/AGGRAVATING FACTORS: "What makes it better or worse?" (e.g., movement, antacids, bowel movement)     Nothing 10. OTHER SYMPTOMS: "Do you have any other symptoms?" (e.g., back pain, diarrhea, fever, urination pain, vomiting)       Nausea. No other symptoms 11. PREGNANCY: "Is there any chance you are pregnant?" "When was your last menstrual period?"       Not asked  Protocols used: ABDOMINAL PAIN - [facility]

## 2021-04-10 NOTE — Telephone Encounter (Signed)
Can see if anyone has availability tomorrow. Also could consider [place] or [place] for acute visit.

## 2021-05-09 ENCOUNTER — Encounter: Payer: Self-pay | Admitting: Family Medicine

## 2021-05-09 DIAGNOSIS — B009 Herpesviral infection, unspecified: Secondary | ICD-10-CM

## 2021-05-10 MED ORDER — VALACYCLOVIR HCL 1 G PO TABS: 1000.0000 mg | ORAL_TABLET | Freq: Two times a day (BID) | ORAL | 2 refills | Status: DC

## 2021-05-27 ENCOUNTER — Other Ambulatory Visit: Payer: Self-pay

## 2021-05-27 ENCOUNTER — Encounter: Payer: Self-pay | Admitting: Family Medicine

## 2021-05-27 ENCOUNTER — Ambulatory Visit
Admission: RE | Admit: 2021-05-27 | Discharge: 2021-05-27 | Disposition: A | Discharge status: Home / Self Care | Payer: Managed Care, Other (non HMO) | Attending: Family Medicine | Admitting: Family Medicine

## 2021-05-27 ENCOUNTER — Ambulatory Visit
Admission: RE | Admit: 2021-05-27 | Discharge: 2021-05-27 | Disposition: A | Discharge status: Home / Self Care | Payer: Managed Care, Other (non HMO) | Source: Ambulatory Visit | Attending: Family Medicine | Admitting: Family Medicine

## 2021-05-27 ENCOUNTER — Ambulatory Visit: Payer: Managed Care, Other (non HMO) | Admitting: Family Medicine

## 2021-05-27 VITALS — BP 139/80 | HR 76 | Temp 98.9°F | Resp 16 | Ht 63.0 in | Wt 128.0 lb

## 2021-05-27 DIAGNOSIS — R1032 Left lower quadrant pain: Secondary | ICD-10-CM

## 2021-05-27 DIAGNOSIS — R1084 Generalized abdominal pain: Secondary | ICD-10-CM

## 2021-05-27 NOTE — Progress Notes (Signed)
Established patient visit   Patient: Betty Rodriguez   DOB: Nov 03, 1971   50 y.o. Female  MRN: [id number] Visit Date: 05/27/2021  Today's healthcare provider: Lavon Paganini, MD   Chief Complaint  Patient presents with   Abdominal Pain   Subjective    Abdominal Pain This is a new problem. The current episode started more than 1 month ago. The onset quality is undetermined. The problem occurs intermittently. The problem has been unchanged. The pain is located in the generalized abdominal region. The pain is moderate. The quality of the pain is dull. The abdominal pain does not radiate. Pertinent negatives include no constipation, diarrhea, dysuria, fever, frequency, headaches, hematochezia, myalgias, nausea, vomiting or weight loss. Nothing aggravates the pain.  The pain is localized on the upper and lower quadrant on the left side. She denies constipation, blood in stool, diarrhea or hematuria. She hasn't used anything additional to alleviate her symptoms.      Medications: Outpatient Medications Prior to Visit  Medication Sig   albuterol (VENTOLIN HFA) 108 (90 Base) MCG/ACT inhaler Inhale 2 puffs into the lungs every 4 (four) hours as needed for wheezing or shortness of breath.   diltiazem (CARDIZEM) 30 MG tablet Take 1 tablet (30 mg total) by mouth every 6 (six) hours as needed (palpitations).   fluticasone (FLONASE) 50 MCG/ACT nasal spray Place 2 sprays into both nostrils daily.   gabapentin (NEURONTIN) 300 MG capsule Take 1 capsule (300 mg total) by mouth 3 (three) times daily as needed.   ibuprofen (ADVIL) 800 MG tablet Take 1 tablet (800 mg total) by mouth every 8 (eight) hours as needed.   JUNEL FE 1/20 1-20 MG-MCG tablet Take 1 tablet by mouth daily. Take 4 packs continuously and then break for 1 week   meclizine (ANTIVERT) 12.5 MG tablet Take 1 tablet (12.5 mg total) by mouth 3 (three) times daily as needed for dizziness.   metoprolol succinate (TOPROL-XL) 25 MG 24  hr tablet Take 0.5 tablet (12.5 mg) by mouth once daily, you may take an extra 0.5 tablet (12.5 mg) once daily as needed for breakthrough tachycardia   valACYclovir (VALTREX) 1000 MG tablet Take 1 tablet (1,000 mg total) by mouth 2 (two) times daily. for 3 days as needed   No facility-administered medications prior to visit.   Review of Systems  Constitutional:  Negative for chills, fatigue, fever and weight loss.  HENT:  Negative for ear pain, sinus pressure, sinus pain and sore throat.   Eyes:  Negative for pain and visual disturbance.  Respiratory:  Negative for cough, chest tightness, shortness of breath and wheezing.   Cardiovascular:  Negative for chest pain, palpitations and leg swelling.  Gastrointestinal:  Positive for abdominal pain. Negative for blood in stool, constipation, diarrhea, hematochezia, nausea and vomiting.  Genitourinary:  Negative for difficulty urinating, dysuria, flank pain, frequency, pelvic pain and urgency.  Musculoskeletal:  Negative for back pain, myalgias and neck pain.  Neurological:  Negative for dizziness, weakness, light-headedness, numbness and headaches.      Objective    BP 139/80   Pulse 76   Temp 98.9 F (37.2 C)   Resp 16   Ht 5' 3"  (1.6 m)   Wt 128 lb (58.1 kg)   BMI 22.67 kg/m     Physical Exam Vitals reviewed.  Constitutional:      General: She is not in acute distress.    Appearance: Normal appearance. She is well-developed. She is not diaphoretic.  HENT:     Head: Normocephalic and atraumatic.  Eyes:     General: No scleral icterus.    Conjunctiva/sclera: Conjunctivae normal.  Neck:     Thyroid: No thyromegaly.  Cardiovascular:     Rate and Rhythm: Normal rate and regular rhythm.     Pulses: Normal pulses.     Heart sounds: Normal heart sounds. No murmur heard. Pulmonary:     Effort: Pulmonary effort is normal. No respiratory distress.     Breath sounds: Normal breath sounds. No wheezing, rhonchi or rales.  Abdominal:      General: Bowel sounds are normal.     Palpations: Abdomen is soft.     Tenderness: There is no abdominal tenderness. There is no right CVA tenderness, left CVA tenderness, guarding or rebound.  Musculoskeletal:     Cervical back: Neck supple.     Right lower leg: No edema.     Left lower leg: No edema.  Lymphadenopathy:     Cervical: No cervical adenopathy.  Skin:    General: Skin is warm and dry.     Findings: No rash.  Neurological:     Mental Status: She is alert and oriented to person, place, and time. Mental status is at baseline.  Psychiatric:        Mood and Affect: Mood normal.        Behavior: Behavior normal.    No results found for any visits on 05/27/21.  Assessment & Plan     Problem List Items Addressed This Visit       Other   LLQ pain - Primary    chroinc abd pain in LUQ and LLQ with benign exam and recent normal CT abd/pelvis She denies constipation, but consider Get 1v abd Xray Consider miralax Referral to GI pending KUB for further eval and management       Relevant Orders   DG Abd 1 View   Other Visit Diagnoses     Generalized abdominal pain           No follow-ups on file.       I,Essence Turner,acting as a Education administrator for Lavon Paganini, MD.,have documented all relevant documentation on the behalf of Lavon Paganini, MD,as directed by  Lavon Paganini, MD while in the presence of Lavon Paganini, MD.  I, Lavon Paganini, MD, have reviewed all documentation for this visit. The documentation on 05/28/21 for the exam, diagnosis, procedures, and orders are all accurate and complete.   Jeshawn Melucci, Dionne Bucy, MD, MPH [facility]

## 2021-05-28 ENCOUNTER — Telehealth: Payer: Self-pay

## 2021-05-28 NOTE — Telephone Encounter (Signed)
Copied from [place] [id number]. Topic: General - Other >> May 28, 2021  3:58 PM Mcneil, Ja-Kwan wrote: Reason for CRM: Pt stated she had some testing done yesterday and she was told she would receive a call about the results but she has not heard back from anyone. Pt requests call back to discuss results.

## 2021-05-28 NOTE — Telephone Encounter (Signed)
I haven't gotten x-ray results back from radiology yet

## 2021-05-28 NOTE — Telephone Encounter (Signed)
Please review. Thanks!  

## 2021-05-28 NOTE — Assessment & Plan Note (Signed)
chroinc abd pain in LUQ and LLQ with benign exam and recent normal CT abd/pelvis She denies constipation, but consider Get 1v abd Xray Consider miralax Referral to GI pending KUB for further eval and management

## 2021-05-29 NOTE — Telephone Encounter (Signed)
See result note, patient has viewed message from Dr. Brita Romp in reference to results of xray. KW

## 2021-06-04 ENCOUNTER — Other Ambulatory Visit: Payer: Self-pay | Admitting: Physician Assistant

## 2021-06-04 ENCOUNTER — Encounter: Payer: Self-pay | Admitting: Family Medicine

## 2021-06-04 DIAGNOSIS — R1032 Left lower quadrant pain: Secondary | ICD-10-CM

## 2021-06-04 DIAGNOSIS — N926 Irregular menstruation, unspecified: Secondary | ICD-10-CM

## 2021-06-05 NOTE — Telephone Encounter (Signed)
Ok to place as requested

## 2021-06-09 ENCOUNTER — Other Ambulatory Visit: Payer: Self-pay | Admitting: Family Medicine

## 2021-06-09 DIAGNOSIS — N926 Irregular menstruation, unspecified: Secondary | ICD-10-CM

## 2021-06-09 NOTE — Telephone Encounter (Signed)
Copied from [place] [id number]. Topic: Quick Communication - Rx Refill/Question >> Jun 09, 2021  4:00 PM Yvette Rack wrote: Medication: JUNEL FE 1/20 1-20 MG-MCG tablet  Has the patient contacted their pharmacy? Yes.   (Agent: If no, request that the patient contact the pharmacy for the refill.) (Agent: If yes, when and what did the pharmacy advise?)  Preferred Pharmacy (with phone number or street name): [id number] [address], [place] Phone: [phone]  Fax: [phone]  Agent: Please be advised that RX refills may take up to 3 business days. We ask that you follow-up with your pharmacy.

## 2021-06-10 MED ORDER — JUNEL FE 1/20 1-20 MG-MCG PO TABS: 1.0000 | ORAL_TABLET | Freq: Every day | ORAL | 0 refills | Status: DC

## 2021-08-04 ENCOUNTER — Ambulatory Visit
Admission: EM | Admit: 2021-08-04 | Discharge: 2021-08-04 | Disposition: A | Discharge status: Home / Self Care | Payer: Managed Care, Other (non HMO) | Attending: Emergency Medicine | Admitting: Emergency Medicine

## 2021-08-04 ENCOUNTER — Encounter: Payer: Self-pay | Admitting: Emergency Medicine

## 2021-08-04 ENCOUNTER — Other Ambulatory Visit: Payer: Self-pay

## 2021-08-04 DIAGNOSIS — B349 Viral infection, unspecified: Secondary | ICD-10-CM

## 2021-08-04 HISTORY — DX: Personal history of other infectious and parasitic diseases: Z86.19

## 2021-08-04 NOTE — ED Triage Notes (Signed)
Patient c/o nasal congestion and generalized body aches x 1 day.   Patient denies fever.   Patient endorses nonproductive cough. Patient endorses scratchy throat.   Patient endorses chest tightness.   Patient denies any recent COVID exposure.   Patient has taken Tylenol w/ some relief of body aches.

## 2021-08-04 NOTE — ED Provider Notes (Signed)
[facility]    CSN: [id number] Arrival date & time: 08/04/21  1538      History   Chief Complaint Chief Complaint  Patient presents with   Generalized Body Aches   Nasal Congestion    HPI Betty Rodriguez is a 50 y.o. female.  Patient presents with body aches, nasal congestion, sore throat, nonproductive cough since yesterday.  Treatment attempted with Tylenol.  She denies fever, chills, rash, shortness of breath, vomiting, diarrhea, or other symptoms.  Her medical history includes hypertension, SVT, GERD, kidney stones.  The history is provided by the patient and medical records.   Past Medical History:  Diagnosis Date   Allergy    Anxiety    Chicken pox    History of cold sores    Kidney stones    SVT (supraventricular tachycardia) ([place])    a. 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 Authorizing Provider  JUNEL FE 1/20 1-20 MG-MCG tablet Take 1 tablet by mouth daily. Take 4 packs  continuously and then break for 1 week 06/10/21  Yes Bacigalupo, Dionne Bucy, MD  metoprolol succinate (TOPROL-XL) 25 MG 24 hr tablet Take 0.5 tablet (12.5 mg) by mouth once daily, you may take an extra 0.5 tablet (12.5 mg) once daily as needed for breakthrough tachycardia 08/13/20  Yes Gollan, Kathlene November, MD  albuterol (VENTOLIN HFA) 108 (90 Base) MCG/ACT inhaler Inhale 2 puffs into the lungs every 4 (four) hours as needed for wheezing or shortness of breath. 06/27/20   Sharion Balloon, NP  BLISOVI FE 1/20 1-20 MG-MCG tablet TAKE 1 TABLET BY MOUTH EVERY DAY FOR 4 PACKS THEN BREAK FOR 1 WEEK 06/10/21   Bacigalupo, Dionne Bucy, MD  diltiazem (CARDIZEM) 30 MG tablet Take 1 tablet (30 mg total) by mouth every 6 (six) hours as needed (palpitations). 07/11/19   Virginia Crews, MD  fluticasone (FLONASE) 50 MCG/ACT nasal spray Place 2 sprays into both nostrils daily. 07/24/20   Bacigalupo, Dionne Bucy, MD  gabapentin (NEURONTIN) 300 MG capsule Take 1 capsule (300 mg total) by mouth 3 (three) times daily as needed. 10/15/20   Rubie Maid, MD  ibuprofen (ADVIL) 800 MG tablet Take 1 tablet (800 mg total) by  mouth every 8 (eight) hours as needed. 10/15/20   Rubie Maid, MD  meclizine (ANTIVERT) 12.5 MG tablet Take 1 tablet (12.5 mg total) by mouth 3 (three) times daily as needed for dizziness. 07/24/20   Virginia Crews, MD  valACYclovir (VALTREX) 1000 MG tablet Take 1 tablet (1,000 mg total) by mouth 2 (two) times daily. for 3 days as needed 05/10/21   Birdie Sons, MD    Family History Family History  Problem Relation Age of Onset   Healthy Mother    Hyperlipidemia Father    Hypertension Father    Diabetes Maternal Grandmother    Hypertension Maternal Grandmother    Heart disease Maternal Grandmother    Stroke Maternal Grandmother 70   Diabetes Maternal Grandfather    Hypertension Maternal Grandfather    Heart disease Maternal Grandfather    Multiple sclerosis Paternal Grandmother    Breast cancer  Paternal Aunt    Colon cancer Neg Hx    Ovarian cancer Neg Hx     Social History Social History   Tobacco Use   Smoking status: Never   Smokeless tobacco: Never  Vaping Use   Vaping Use: Never used  Substance Use Topics   Alcohol use: Yes    Alcohol/week: 0.0 standard drinks    Comment: Rare    Drug use: No     Allergies   Amlodipine, Sulfa antibiotics, and Zoloft [sertraline]   Review of Systems Review of Systems  Constitutional:  Negative for chills and fever.  HENT:  Positive for congestion and sore throat. Negative for ear pain.   Respiratory:  Positive for cough. Negative for shortness of breath.   Cardiovascular:  Negative for chest pain and palpitations.  Gastrointestinal:  Negative for abdominal pain, diarrhea and vomiting.  Skin:  Negative for color change and rash.  All other systems reviewed and are negative.   Physical Exam Triage Vital Signs ED Triage Vitals  Enc Vitals Group     BP      Pulse      Resp      Temp      Temp src      SpO2      Weight      Height      Head Circumference      Peak Flow      Pain Score      Pain Loc      Pain Edu?      Excl. in [place]?    No data found.  Updated Vital Signs BP 135/82 (BP Location: Left Arm)   Pulse (!) 102   Temp 99.4 F (37.4 C) (Oral)   Resp 15   LMP  (LMP Unknown)   SpO2 96%   Visual Acuity Right Eye Distance:   Left Eye Distance:   Bilateral Distance:    Right Eye Near:   Left Eye Near:    Bilateral Near:     Physical Exam Vitals and nursing note reviewed.  Constitutional:      General: She is not in acute distress.    Appearance: She is well-developed.  HENT:     Head: Normocephalic and atraumatic.     Right Ear: Tympanic membrane normal.     Left Ear: Tympanic membrane normal.     Nose: Nose normal.     Mouth/Throat:     Mouth: Mucous membranes are moist.     Pharynx: Oropharynx is clear.  Eyes:     Conjunctiva/sclera: Conjunctivae normal.  Cardiovascular:     Rate  and Rhythm: Normal rate and regular rhythm.     Heart sounds: Normal heart sounds.  Pulmonary:     Effort: Pulmonary effort is normal. No respiratory distress.     Breath sounds: Normal breath sounds.  Abdominal:     Palpations: Abdomen is soft.     Tenderness: There is no abdominal tenderness.  Musculoskeletal:     Cervical back: Neck supple.  Skin:    General: Skin is warm and dry.  Neurological:     General: No focal deficit present.     Mental Status: She is alert and oriented to person, place, and time.     Gait: Gait normal.  Psychiatric:        Mood and Affect: Mood normal.        Behavior: Behavior normal.     UC Treatments / Results  Labs (all labs ordered are listed, but only abnormal results are displayed) Labs Reviewed  NOVEL CORONAVIRUS, NAA    EKG   Radiology No results found.  Procedures Procedures (including critical care time)  Medications Ordered in UC Medications - No data to display  Initial Impression / Assessment and Plan / UC Course  I have reviewed the triage vital signs and the nursing notes.  Pertinent labs & imaging results that were available during my care of the patient were reviewed by me and considered in my medical decision making (see chart for details).   Viral illness.  COVID pending.  Instructed patient to self quarantine per CDC guidelines.  Discussed symptomatic treatment including Tylenol or ibuprofen, rest, hydration.  Instructed patient to follow up with PCP if symptoms are not improving.  Patient agrees to plan of care.    Final Clinical Impressions(s) / UC Diagnoses   Final diagnoses:  Viral illness     Discharge Instructions      Your COVID test is pending.  You should self quarantine until the test result is back.    Take Tylenol or ibuprofen as needed for fever or discomfort.  Rest and keep yourself hydrated.    Follow-up with your primary care provider if your symptoms are not improving.         ED  Prescriptions   None    PDMP not reviewed this encounter.   Sharion Balloon, NP 08/04/21 1610

## 2021-08-04 NOTE — Discharge Instructions (Addendum)
Your COVID test is pending.  You should self quarantine until the test result is back.    Take Tylenol or ibuprofen as needed for fever or discomfort.  Rest and keep yourself hydrated.    Follow-up with your primary care provider if your symptoms are not improving.

## 2021-08-06 LAB — SARS-COV-2, NAA 2 DAY TAT

## 2021-08-06 LAB — NOVEL CORONAVIRUS, NAA: SARS-CoV-2, NAA: DETECTED — AB

## 2021-08-07 ENCOUNTER — Encounter: Payer: Self-pay | Admitting: Family Medicine

## 2021-08-07 ENCOUNTER — Telehealth (INDEPENDENT_AMBULATORY_CARE_PROVIDER_SITE_OTHER): Payer: Managed Care, Other (non HMO) | Admitting: Family Medicine

## 2021-08-07 DIAGNOSIS — I471 Supraventricular tachycardia: Secondary | ICD-10-CM

## 2021-08-07 DIAGNOSIS — U071 COVID-19: Secondary | ICD-10-CM

## 2021-08-07 MED ORDER — MOLNUPIRAVIR EUA 200MG CAPSULE: 4.0000 | ORAL_CAPSULE | Freq: Two times a day (BID) | ORAL | 0 refills | Status: AC

## 2021-08-07 MED ORDER — ALBUTEROL SULFATE HFA 108 (90 BASE) MCG/ACT IN AERS: 2.0000 | INHALATION_SPRAY | RESPIRATORY_TRACT | 0 refills | Status: DC | PRN

## 2021-08-07 NOTE — Progress Notes (Signed)
MyChart Video Visit    Virtual Visit via Video Note   This visit type was conducted due to national recommendations for restrictions regarding the COVID-19 Pandemic (e.g. social distancing) in an effort to limit this patient's exposure and mitigate transmission in our community. This patient is at least at moderate risk for complications without adequate follow up. This format is felt to be most appropriate for this patient at this time. Physical exam was limited by quality of the video and audio technology used for the visit.   Patient location: Home Provider location: Office  I discussed the limitations of evaluation and management by telemedicine and the availability of in person appointments. The patient expressed understanding and agreed to proceed.  Patient: Betty Rodriguez   DOB: [birth year]   50 y.o. Female  MRN: [id number] Visit Date: 08/07/2021  Today's healthcare provider: Vernie Murders, PA-C   Chief Complaint  Patient presents with   Covid Positive   Subjective    HPI  Developed some body aches, temperature of 99, congestion, loss of sense of taste and "brain fog" since 08-03-21. Test reported positive for COVID yesterday (was evaluated at an UC on 08-04-21). No significant headache or dyspnea. Using Mucinex and Alka Seltzer Cold & Flu. Fiance tested positive yesterday, also. Past Medical History:  Diagnosis Date   Allergy    Anxiety    Chicken pox    History of cold sores    Kidney stones    SVT (supraventricular tachycardia) ([place])    a. diagnosed 03/2017 in setting of hypokalemia   Vaginal Pap smear, abnormal    Past Surgical History:  Procedure Laterality Date   BREAST BIOPSY Right 07/06/2016   us/ core/neg-BENIGN BREAST TISSUE    TONSILLECTOMY  1983   Family History  Problem Relation Age of Onset   Healthy Mother    Hyperlipidemia Father    Hypertension Father    Diabetes Maternal Grandmother    Hypertension Maternal Grandmother    Heart disease  Maternal Grandmother    Stroke Maternal Grandmother 70   Diabetes Maternal Grandfather    Hypertension Maternal Grandfather    Heart disease Maternal Grandfather    Multiple sclerosis Paternal Grandmother    Breast cancer Paternal Aunt    Colon cancer Neg Hx    Ovarian cancer Neg Hx    Social History   Tobacco Use   Smoking status: Never   Smokeless tobacco: Never  Vaping Use   Vaping Use: Never used  Substance Use Topics   Alcohol use: Yes    Alcohol/week: 0.0 standard drinks    Comment: Rare    Drug use: No   Allergies  Allergen Reactions   Amlodipine Hives   Sulfa Antibiotics Hives   Zoloft [Sertraline]     SVT   Medications: Outpatient Medications Prior to Visit  Medication Sig   albuterol (VENTOLIN HFA) 108 (90 Base) MCG/ACT inhaler Inhale 2 puffs into the lungs every 4 (four) hours as needed for wheezing or shortness of breath.   BLISOVI FE 1/20 1-20 MG-MCG tablet TAKE 1 TABLET BY MOUTH EVERY DAY FOR 4 PACKS THEN BREAK FOR 1 WEEK   diltiazem (CARDIZEM) 30 MG tablet Take 1 tablet (30 mg total) by mouth every 6 (six) hours as needed (palpitations).   fluticasone (FLONASE) 50 MCG/ACT nasal spray Place 2 sprays into both nostrils daily.   gabapentin (NEURONTIN) 300 MG capsule Take 1 capsule (300 mg total) by mouth 3 (three) times daily as needed.  ibuprofen (ADVIL) 800 MG tablet Take 1 tablet (800 mg total) by mouth every 8 (eight) hours as needed.   JUNEL FE 1/20 1-20 MG-MCG tablet Take 1 tablet by mouth daily. Take 4 packs continuously and then break for 1 week   meclizine (ANTIVERT) 12.5 MG tablet Take 1 tablet (12.5 mg total) by mouth 3 (three) times daily as needed for dizziness.   metoprolol succinate (TOPROL-XL) 25 MG 24 hr tablet Take 0.5 tablet (12.5 mg) by mouth once daily, you may take an extra 0.5 tablet (12.5 mg) once daily as needed for breakthrough tachycardia   valACYclovir (VALTREX) 1000 MG tablet Take 1 tablet (1,000 mg total) by mouth 2 (two) times  daily. for 3 days as needed   No facility-administered medications prior to visit.    Review of Systems  Constitutional:  Positive for fatigue.  HENT:  Positive for congestion.   Respiratory:  Positive for chest tightness.   Cardiovascular: Negative.   Gastrointestinal: Negative.      Objective    LMP  (LMP Unknown)   Physical Exam: WDWN female in no apparent distress.  Head: Normocephalic, atraumatic. Neck: Supple, NROM Respiratory: No apparent distress Psych: Normal mood and affect      Assessment & Plan     1. COVID-19 virus infection Developed chest congestion and tightness over the past 4 days with positive COVID test report received yesterday. Continue Mucinex-DM prn cough, add Albuterol inhaler and Molnupiravir with history of cardiac arrhythmia/SVT. Increase fluid intake and isolate at home for 5-7 day and free of fever for 2-3 days without antipyretics. Recheck if worsening symptoms or go to ER if having dyspnea. - molnupiravir EUA 200 mg CAPS; Take 4 capsules (800 mg total) by mouth 2 (two) times daily for 5 days.  Dispense: 40 capsule; Refill: 0 - albuterol (VENTOLIN HFA) 108 (90 Base) MCG/ACT inhaler; Inhale 2 puffs into the lungs every 4 (four) hours as needed for wheezing or shortness of breath.  Dispense: 18 g; Refill: 0  2. SVT (supraventricular tachycardia) (HCC) Presently on Metoprolol and Cardizem for hypertension and cardiac arrhythmia control. On BCP and not pregnant. With this cardiovascular disease, will treat with antiviral agent.   No follow-ups on file.     I discussed the assessment and treatment plan with the patient. The patient was provided an opportunity to ask questions and all were answered. The patient agreed with the plan and demonstrated an understanding of the instructions.   The patient was advised to call back or seek an in-person evaluation if the symptoms worsen or if the condition fails to improve as anticipated.  I provided 20  minutes of non-face-to-face time during this encounter.  I, Lashell Moffitt, PA-C, have reviewed all documentation for this visit. The documentation on 08/07/21 for the exam, diagnosis, procedures, and orders are all accurate and complete.   Vernie Murders, PA-C [employer] [phone] (phone) [phone] (fax)  [place]

## 2021-08-08 ENCOUNTER — Other Ambulatory Visit: Payer: Self-pay

## 2021-08-08 ENCOUNTER — Telehealth: Payer: Self-pay

## 2021-08-08 MED ORDER — PAXLOVID (300/100) 20 X 150 MG & 10 X 100MG PO TBPK
ORAL_TABLET | ORAL | 0 refills | Status: DC
  Filled 2021-08-08: qty 30, 5d supply, fill #0

## 2021-08-08 NOTE — Telephone Encounter (Signed)
Outpatient Pharmacy Oral COVID Treatment Note  I connected with Betty Rodriguez on 08/08/2021/3:00 PM by telephone and verified that I am speaking with the correct person using two identifiers.  I discussed the limitations, risks, security, and privacy concerns of performing an evaluation and management service by telephone and the availability of in person appointments via referral to a physician. The patient expressed understanding and agreed to proceed.  Pharmacy location: [facility]  Diagnosis: COVID-19 infection  Purpose of visit: Discussion of potential use of Paxlovid, a new treatment for mild to moderate COVID-19 viral infection in non-hospitalized patients.  Subjective/Objective: Patient is a 50 y.o. female who is presenting with COVID 19 viral infection.  COVID 19 viral infection. Their symptoms began on 08/04/21 with cold symptoms.  The patient has confirmed COVID-19 via a home test on 08/06/21   Past Medical History:  Diagnosis Date   Allergy    Anxiety    Chicken pox    History of cold sores    Kidney stones    SVT (supraventricular tachycardia) ([place])    a. diagnosed 03/2017 in setting of hypokalemia   Vaginal Pap smear, abnormal      Allergies  Allergen Reactions   Amlodipine Hives   Sulfa Antibiotics Hives   Zoloft [Sertraline]     SVT     Current Outpatient Medications:    albuterol (VENTOLIN HFA) 108 (90 Base) MCG/ACT inhaler, Inhale 2 puffs into the lungs every 4 (four) hours as needed for wheezing or shortness of breath., Disp: 18 g, Rfl: 0   BLISOVI FE 1/20 1-20 MG-MCG tablet, TAKE 1 TABLET BY MOUTH EVERY DAY FOR 4 PACKS THEN BREAK FOR 1 WEEK, Disp: 112 tablet, Rfl: 1   diltiazem (CARDIZEM) 30 MG tablet, Take 1 tablet (30 mg total) by mouth every 6 (six) hours as needed (palpitations)., Disp: 60 tablet, Rfl: 3   fluticasone (FLONASE) 50 MCG/ACT nasal spray, Place 2 sprays into both nostrils daily., Disp: 16 g, Rfl: 6   gabapentin (NEURONTIN) 300  MG capsule, Take 1 capsule (300 mg total) by mouth 3 (three) times daily as needed., Disp: 90 capsule, Rfl: 1   ibuprofen (ADVIL) 800 MG tablet, Take 1 tablet (800 mg total) by mouth every 8 (eight) hours as needed., Disp: 60 tablet, Rfl: 1   JUNEL FE 1/20 1-20 MG-MCG tablet, Take 1 tablet by mouth daily. Take 4 packs continuously and then break for 1 week, Disp: 112 tablet, Rfl: 0   meclizine (ANTIVERT) 12.5 MG tablet, Take 1 tablet (12.5 mg total) by mouth 3 (three) times daily as needed for dizziness., Disp: 30 tablet, Rfl: 0   metoprolol succinate (TOPROL-XL) 25 MG 24 hr tablet, Take 0.5 tablet (12.5 mg) by mouth once daily, you may take an extra 0.5 tablet (12.5 mg) once daily as needed for breakthrough tachycardia, Disp: 90 tablet, Rfl: 3   molnupiravir EUA 200 mg CAPS, Take 4 capsules (800 mg total) by mouth 2 (two) times daily for 5 days., Disp: 40 capsule, Rfl: 0   nirmatrelvir & ritonavir (PAXLOVID, 300/100,) 20 x 150 MG & 10 x 100MG TBPK, Take 2 nirmatrelvir (150 mg) tablets and take 1 ritonavir (100 mg) tablet (3 tablets total) by mouth two times daily for 5 days, Disp: 30 tablet, Rfl: 0   valACYclovir (VALTREX) 1000 MG tablet, Take 1 tablet (1,000 mg total) by mouth 2 (two) times daily. for 3 days as needed, Disp: 20 tablet, Rfl: 2  Lab Monitoring: eGFR 78 on 06/11/21  Drug Interactions Noted: Potential interaction with birth control. Patient instructed to use barrier method of contraception while taking Paxlovid and for 28 days after completing course.   Plan:  This patient is a 50 y.o. female that meets the criteria for Emergency Use Authorization of Paxlovid. After reviewing the emergency use authorization with the patient, the patient agrees to receive Paxlovid.  Through FDA guidance and current [place] standing order Paxlovid will be prescribed to the patient.   Patient contacted for counseling on 08/08/21 and verbalized understanding.   Delivery or Pick-Up Date:  08/08/21  Follow up instructions:    Take prescription BID x 5 days as directed Counseling was provided by pharmacist. Reach out to pharmacist with follow up questions For concerns regarding further COVID symptoms please follow up with your PCP or urgent care For urgent or life-threatening issues, seek care at your local emergency department   Carolynne Edouard 08/08/2021, 3:00 PM [place] Pharmacist Phone# [phone]

## 2021-08-26 IMAGING — CR DG ABDOMEN 1V
1 series · 2 of 2 positions shown · non-contrast
Comparison: Radiograph 10/10/2020, CT 10/14/2020

CLINICAL DATA: abdominal pain

EXAM:
ABDOMEN - 1 VIEW

[Series 1: dg abd 1 view · 0.14mm/px · 2 of 2 slices shown]
[im 1/2]
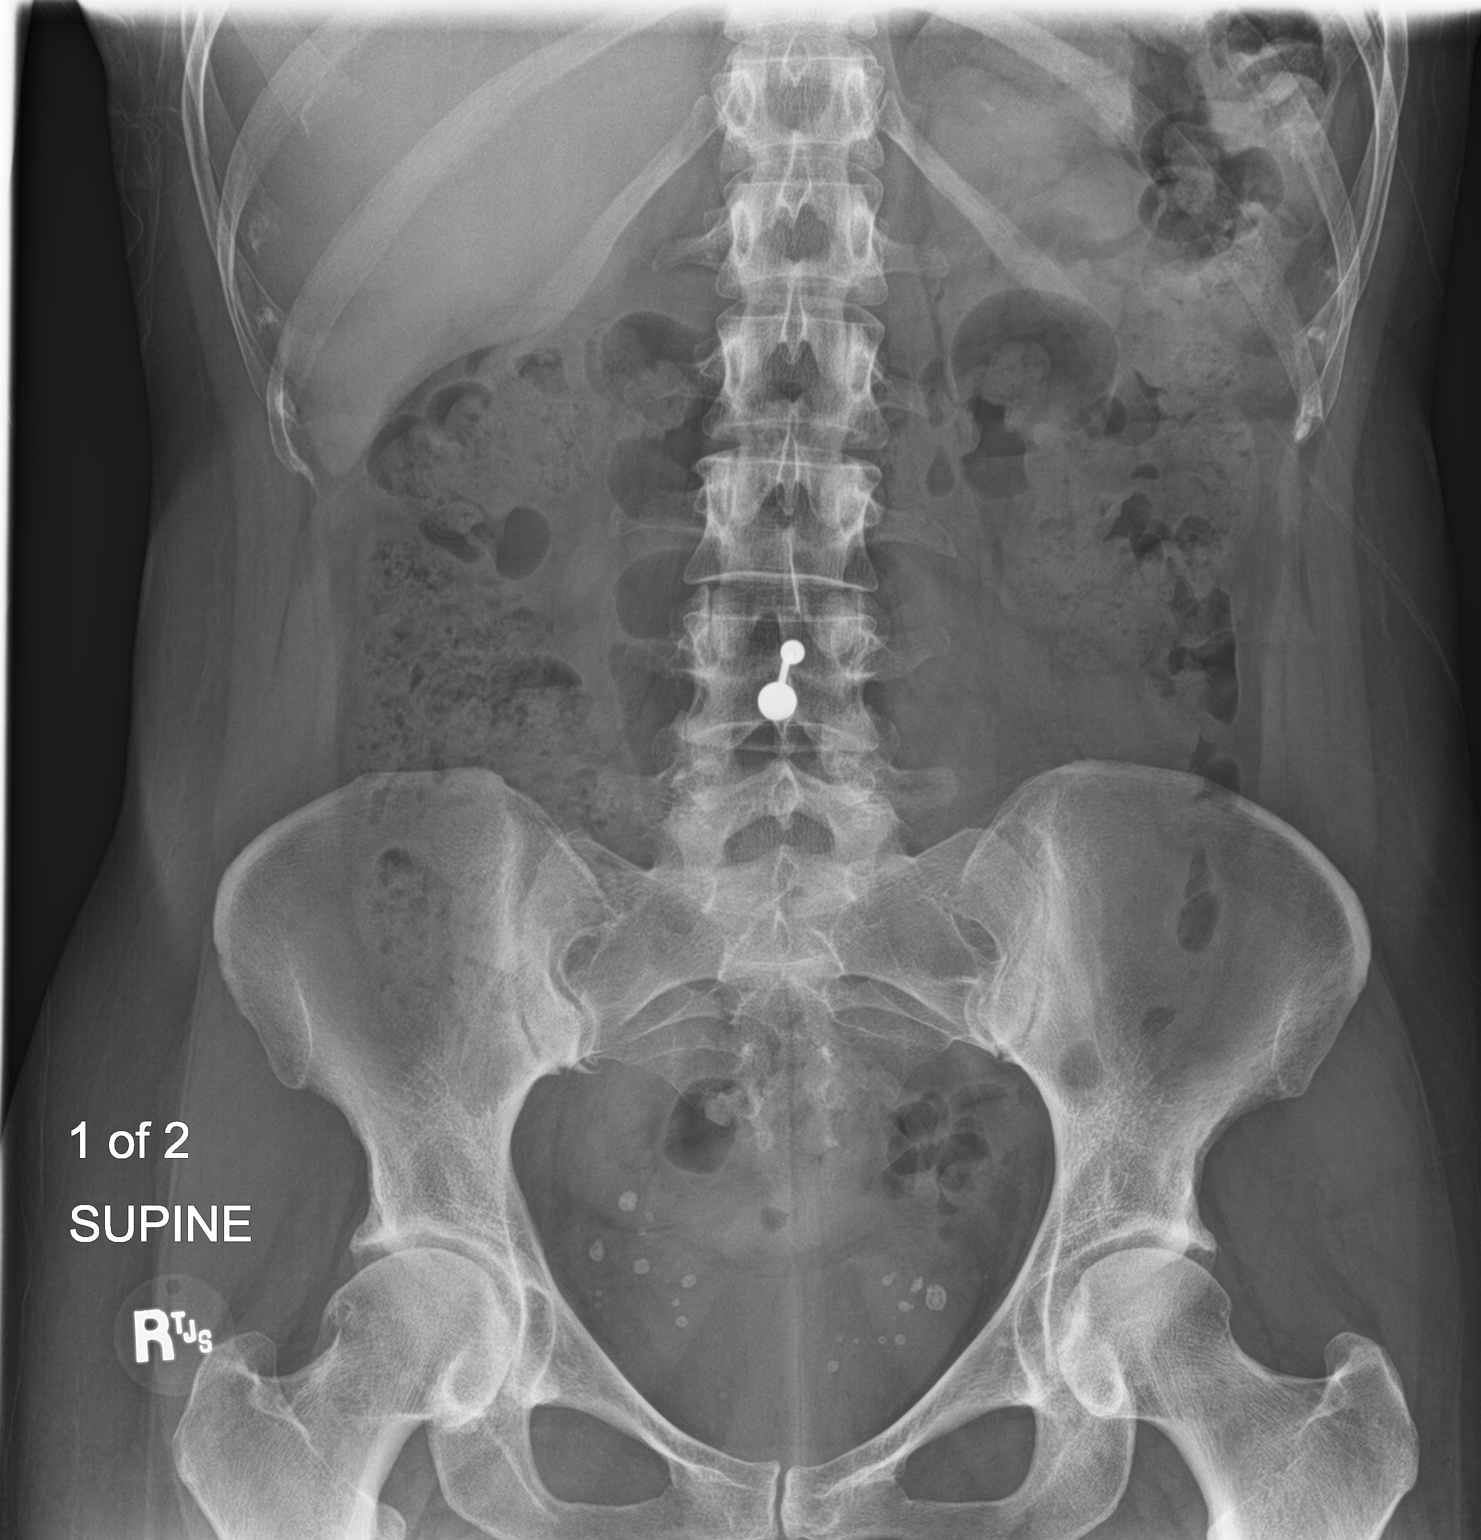
[im 2/2]
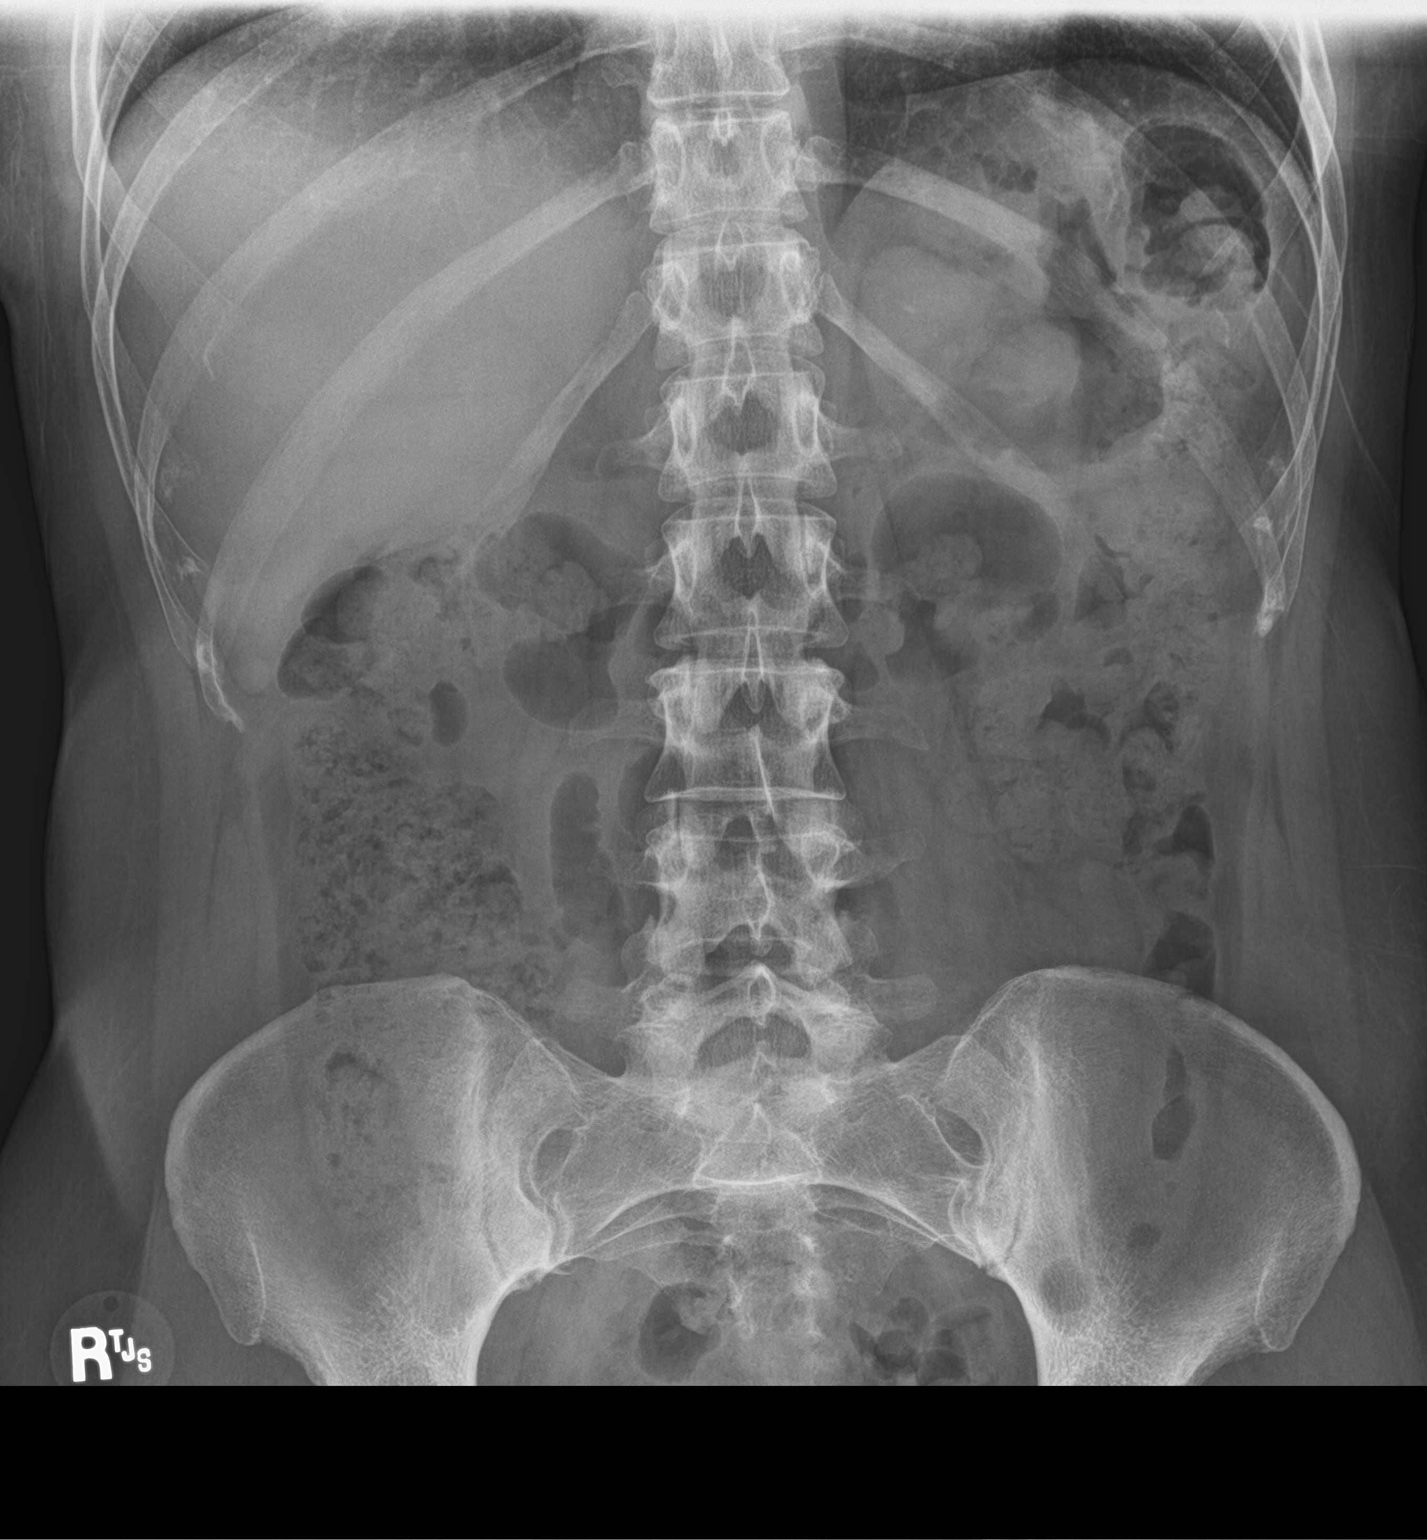

[2 of 2 positions shown; findings below may reference images not displayed]

FINDINGS: No evidence of bowel obstruction. Moderate stool burden throughout
the colon. Multiple pelvic phleboliths. Body piercing overlies the
mid abdomen. There is no acute osseous abnormality. There is a 4 mm
calcification overlying the upper pole of the left renal shadow.
IMPRESSION: No evidence of bowel obstruction. Moderate stool burden throughout
the colon.

4 mm calcification overlying the upper pole of the left renal
shadow, possibly a renal calculus.

## 2021-08-28 ENCOUNTER — Other Ambulatory Visit: Payer: Self-pay

## 2021-08-28 ENCOUNTER — Encounter: Payer: Self-pay | Admitting: Emergency Medicine

## 2021-08-28 ENCOUNTER — Ambulatory Visit
Admission: EM | Admit: 2021-08-28 | Discharge: 2021-08-28 | Disposition: A | Discharge status: Home / Self Care | Payer: Managed Care, Other (non HMO) | Attending: Family | Admitting: Family

## 2021-08-28 DIAGNOSIS — R059 Cough, unspecified: Secondary | ICD-10-CM

## 2021-08-28 DIAGNOSIS — J0140 Acute pansinusitis, unspecified: Secondary | ICD-10-CM

## 2021-08-28 MED ORDER — FLUCONAZOLE 150 MG PO TABS: 150.0000 mg | ORAL_TABLET | Freq: Once | ORAL | 0 refills | Status: AC

## 2021-08-28 MED ORDER — AMOXICILLIN-POT CLAVULANATE 875-125 MG PO TABS: 1.0000 | ORAL_TABLET | Freq: Two times a day (BID) | ORAL | 0 refills | Status: AC

## 2021-08-28 NOTE — ED Triage Notes (Signed)
Pt here 1 month post covid with lingering cough and congestion.

## 2021-08-28 NOTE — Discharge Instructions (Addendum)
Recommend start Augmentin 861m twice a day as directed. May take Diflucan 1576mon 3rd day of antibiotic and then again on last day of antibiotic to help prevent vaginal yeast infection. Continue to push fluids to help loosen up mucus in chest. May continue Tylenol [id number] Ibuprofen 600m58mery 8 hours as needed for headache/pain. Follow-up with your PCP in 4 to 5 days if not improving.

## 2021-08-28 NOTE — ED Provider Notes (Signed)
[name]    CSN: [id number] Arrival date & time: 08/28/21  1151      History   Chief Complaint Chief Complaint  Patient presents with   Cough   Nasal Congestion    HPI Betty Rodriguez is a 50 y.o. female.   50 year old female presents with continued nasal congestion, sinus pressure, headache and dry cough for over 1 month.  Was diagnosed with COVID-19 on 08/04/2021 and was prescribed Paxlovid.  No longer having a fever but ears feel full and having continued postnasal drainage and sore throat.  Denies any GI symptoms.  Has taken Alka-Seltzer and Mucinex with minimal relief.  Other chronic health issues include HTN, irregular menses and anxiety.  Currently on Toprol XL and Junel Fe daily and Valtrex prn.   The history is provided by the patient.   Past Medical History:  Diagnosis Date   Allergy    Anxiety    Chicken pox    History of cold sores    Kidney stones    SVT (supraventricular tachycardia) ([place])    a. 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 Authorizing Provider  amoxicillin-clavulanate (AUGMENTIN) 875-125 MG tablet Take 1 tablet by mouth every 12 (twelve) hours for 7 days. 08/28/21 09/04/21 Yes AmyotNicholes Stairs, NP  JUNEL FE 1/20 1-20 MG-MCG tablet Take 1 tablet by mouth daily. Take 4 packs continuously and then break for 1 week 06/10/21  Yes Bacigalupo, Dionne Bucy, MD  metoprolol succinate (TOPROL-XL) 25 MG 24 hr tablet Take 0.5 tablet (12.5 mg) by mouth once daily, you may take an extra 0.5 tablet (12.5 mg) once daily as needed for breakthrough tachycardia 08/13/20  Yes Gollan, Kathlene November, MD  valACYclovir (VALTREX) 1000 MG tablet Take 1 tablet (1,000 mg total) by mouth 2 (two) times daily. for 3 days as needed 05/10/21   Birdie Sons, MD    Family History Family History  Problem Relation Age of Onset   Healthy Mother    Hyperlipidemia Father    Hypertension Father    Diabetes Maternal Grandmother    Hypertension Maternal Grandmother    Heart disease Maternal Grandmother    Stroke Maternal Grandmother 70   Diabetes  Maternal Grandfather    Hypertension Maternal Grandfather    Heart disease Maternal Grandfather    Multiple sclerosis Paternal Grandmother    Breast cancer Paternal Aunt    Colon cancer Neg Hx    Ovarian cancer Neg Hx     Social History Social History   Tobacco Use   Smoking status: Never   Smokeless tobacco: Never  Vaping Use   Vaping Use: Never used  Substance Use Topics   Alcohol use: Yes    Alcohol/week: 0.0 standard drinks    Comment: Rare    Drug use: No     Allergies   Amlodipine, Sulfa antibiotics, and Zoloft [sertraline]   Review of Systems Review of Systems  Constitutional:  Positive for activity change and fatigue. Negative for appetite change, chills, diaphoresis and fever.  HENT:  Positive for congestion, ear pain, postnasal drip, rhinorrhea, sinus pressure, sinus pain and sore throat. Negative for ear  discharge, facial swelling, mouth sores, nosebleeds and trouble swallowing.   Eyes:  Negative for pain, discharge, redness and itching.  Respiratory:  Positive for cough. Negative for chest tightness, shortness of breath and wheezing.   Gastrointestinal:  Negative for diarrhea, nausea and vomiting.  Musculoskeletal:  Negative for arthralgias, myalgias, neck pain and neck stiffness.  Skin:  Negative for color change and rash.  Allergic/Immunologic: Negative for environmental allergies, food allergies and immunocompromised state.  Neurological:  Positive for headaches. Negative for dizziness, tremors, seizures, syncope, weakness and numbness.  Hematological:  Negative for adenopathy. Does not bruise/bleed easily.    Physical Exam Triage Vital Signs ED Triage Vitals  Enc Vitals Group     BP 08/28/21 1329 (!) 168/89     Pulse Rate 08/28/21 1329 (!) 101     Resp 08/28/21 1329 18     Temp 08/28/21 1329 98.3 F (36.8 C)     Temp Source 08/28/21 1329 Oral     SpO2 08/28/21 1329 98 %     Weight --      Height --      Head Circumference --      Peak Flow --      Pain Score 08/28/21 1337 0     Pain Loc --      Pain Edu? --      Excl. in [place]? --    No data found.  Updated Vital Signs BP (!) 168/89 (BP Location: Left Arm)   Pulse (!) 101   Temp 98.3 F (36.8 C) (Oral)   Resp 18   LMP  (LMP Unknown)   SpO2 98%   Visual Acuity Right Eye Distance:   Left Eye Distance:   Bilateral Distance:    Right Eye Near:   Left Eye Near:    Bilateral Near:     Physical Exam Vitals and nursing note reviewed.  Constitutional:      General: She is awake. She is not in acute distress.    Appearance: She is well-developed and well-groomed. She is ill-appearing.     Comments: She is sitting on the exam table in no acute distress but appears tired and ill.   HENT:     Head: Normocephalic and atraumatic.     Right Ear: Hearing, ear canal and external ear normal. Tympanic membrane is bulging.  Tympanic membrane is not injected or retracted.     Left Ear: Hearing, ear canal and external ear normal. Tympanic membrane is bulging. Tympanic membrane is not injected or retracted.     Nose: Congestion  present.     Right Sinus: Maxillary sinus tenderness and frontal sinus tenderness present.     Left Sinus: Maxillary sinus tenderness and frontal sinus tenderness present.     Mouth/Throat:     Lips: Pink.     Mouth: Mucous membranes are moist.     Pharynx: Uvula midline. Oropharyngeal exudate and posterior oropharyngeal erythema present. No pharyngeal swelling or uvula swelling.     Comments: Yellowish post nasal drainage present.  Eyes:     Extraocular Movements: Extraocular movements intact.     Conjunctiva/sclera: Conjunctivae normal.  Cardiovascular:     Rate and Rhythm: Regular rhythm. Tachycardia present.     Heart sounds: Normal heart sounds. No murmur heard. Pulmonary:     Effort: Pulmonary effort is normal. No respiratory distress.     Breath sounds: Normal breath sounds and air entry. No decreased air movement. No decreased breath sounds, wheezing, rhonchi or rales.  Musculoskeletal:     Cervical back: Normal range of motion and neck supple.  Lymphadenopathy:     Cervical: No cervical adenopathy.  Skin:    General: Skin is warm and dry.     Capillary Refill: Capillary refill takes less than 2 seconds.     Findings: No rash.  Neurological:     General: No focal deficit present.     Mental Status: She is alert and oriented to person, place, and time.  Psychiatric:        Mood and Affect: Mood normal.        Behavior: Behavior normal. Behavior is cooperative.        Thought Content: Thought content normal.        Judgment: Judgment normal.     UC Treatments / Results  Labs (all labs ordered are listed, but only abnormal results are displayed) Labs Reviewed - No data to display  EKG   Radiology No results found.  Procedures Procedures (including critical care  time)  Medications Ordered in UC Medications - No data to display  Initial Impression / Assessment and Plan / UC Course  I have reviewed the triage vital signs and the nursing notes.  Pertinent labs & imaging results that were available during my care of the patient were reviewed by me and considered in my medical decision making (see chart for details).    Reviewed with patient that she appears to have developed a sinus infection after a viral illness. Will start Augmentin 858m twice a day as directed. Since she has a history of antibiotic-induced yeast infections, will prescribe Diflucan 1555m- take 1 pill on 3rd day of antibiotic and last pill on last day of antibiotic use. Continue to push fluids to help loosen up mucus in sinuses and chest.  May continue Tylenol 1000 mg or ibuprofen 600 mg every 8 hours as needed for headache/pain.  Recommend follow-up with her PCP in 4 to 5 days if not improving. Final Clinical Impressions(s) / UC Diagnoses   Final diagnoses:  Acute non-recurrent pansinusitis  Cough     Discharge Instructions      Recommend start Augmentin 87543mwice a day as directed. May take Diflucan 150m24m 3rd day of antibiotic and then again on last day of antibiotic to help prevent vaginal yeast infection. Continue to push fluids to help loosen up mucus in chest. May continue Tylenol 1000mg49mIbuprofen 600mg 67my 8 hours as needed for headache/pain. Follow-up with your PCP in 4 to 5 days if not improving.  ED Prescriptions     Medication Sig Dispense Auth. Provider   amoxicillin-clavulanate (AUGMENTIN) 875-125 MG tablet Take 1 tablet by mouth every 12 (twelve) hours for 7 days. 14 tablet Katy Apo, NP   fluconazole (DIFLUCAN) 150 MG tablet Take 1 tablet (150 mg total) by mouth once for 1 dose. May repeat 1 tablet at end of antibiotic use if needed. 2 tablet Jawann Urbani, Nicholes Stairs, NP      PDMP not reviewed this encounter.   Katy Apo, NP 08/29/21  [phone]

## 2021-10-01 ENCOUNTER — Ambulatory Visit (INDEPENDENT_AMBULATORY_CARE_PROVIDER_SITE_OTHER): Payer: Managed Care, Other (non HMO)

## 2021-10-01 ENCOUNTER — Other Ambulatory Visit: Payer: Self-pay

## 2021-10-01 DIAGNOSIS — Z23 Encounter for immunization: Secondary | ICD-10-CM

## 2021-10-19 ENCOUNTER — Other Ambulatory Visit: Payer: Self-pay | Admitting: Cardiovascular Disease

## 2021-10-20 NOTE — Telephone Encounter (Signed)
Please schedule overdue F/U appointment for refills. Thank you! 

## 2021-11-05 ENCOUNTER — Encounter: Payer: Self-pay | Admitting: Family Medicine

## 2021-11-05 DIAGNOSIS — Z1231 Encounter for screening mammogram for malignant neoplasm of breast: Secondary | ICD-10-CM

## 2021-11-06 NOTE — Telephone Encounter (Signed)
Ok to place screening mammo order. Make sure she is scheduled for next CPE.

## 2021-12-17 ENCOUNTER — Telehealth: Payer: Self-pay | Admitting: Cardiovascular Disease

## 2021-12-17 NOTE — Telephone Encounter (Signed)
Please schedule for overdue 1 yr F/U appt for refills. Last seen 05/2020. Thank you!

## 2021-12-17 NOTE — Telephone Encounter (Signed)
°*  STAT* If patient is at the pharmacy, call can be transferred to refill team.   1. Which medications need to be refilled? (please list name of each medication and dose if known) metoprolol   2. Which pharmacy/location (including street and city if local pharmacy) is medication to be sent to?[facility] [address]   3. Do they need a 30 day or 90 day supply? [place]

## 2021-12-18 NOTE — Telephone Encounter (Signed)
Pt requesting 90 day refill. Pt hasn't been seen since 05/2020 and overdue for 12 month f/u. Pt scheduled future appointment 01/23/2022. Please advise if ok to refill for 30 days?

## 2021-12-18 NOTE — Telephone Encounter (Signed)
Scheduled

## 2021-12-18 NOTE — Telephone Encounter (Signed)
Attempted to schedule.  LMOV to call office.  ° °

## 2021-12-19 NOTE — Telephone Encounter (Signed)
I spoke with pt and she mentioned that no one else has been filling her Metoprolol and that she has only been 0.5 mg tablet like Dr. Rockey Situ told her she could do. I told pt that she could reach out to her PCP to have medication filled being that we haven't seen her in in over a year. She was made aware of our refill protocol and that you have to be seen yearly to continue receiving refills. Pt was adamant that PCP would not fill the medication bc she didn't prescribe Metoprolol and that her PCP is very peculiar on filling medication. She request that we fill her medication and if we could get her in sooner to see Dr. Rockey Situ that would be great. She mentioned that she doesn't have a problem needing to be seen but that she needs her Metoprolol refilled being that she wasn't able to be schedule sooner.

## 2021-12-24 MED ORDER — METOPROLOL SUCCINATE ER 25 MG PO TB24: ORAL_TABLET | ORAL | 0 refills | Status: DC

## 2021-12-24 NOTE — Telephone Encounter (Signed)
Reviewed message in Betty Rodriguez's in basket. Patient has an appt scheduled on 2/24 for follow up. Will send in a 30 day RX for metoprolol succinate 25 mg- 0.5 tablet (12.5 mg) once daily

## 2022-01-23 ENCOUNTER — Other Ambulatory Visit: Payer: Self-pay

## 2022-01-23 ENCOUNTER — Ambulatory Visit: Payer: Managed Care, Other (non HMO) | Admitting: Cardiovascular Disease

## 2022-01-23 ENCOUNTER — Encounter: Payer: Self-pay | Admitting: Cardiovascular Disease

## 2022-01-23 VITALS — BP 120/80 | HR 77 | Ht 63.0 in | Wt 127.0 lb

## 2022-01-23 DIAGNOSIS — I10 Essential (primary) hypertension: Secondary | ICD-10-CM | POA: Diagnosis not present

## 2022-01-23 DIAGNOSIS — R0602 Shortness of breath: Secondary | ICD-10-CM | POA: Diagnosis not present

## 2022-01-23 DIAGNOSIS — I471 Supraventricular tachycardia: Secondary | ICD-10-CM

## 2022-01-23 DIAGNOSIS — F411 Generalized anxiety disorder: Secondary | ICD-10-CM | POA: Diagnosis not present

## 2022-01-23 DIAGNOSIS — Z79899 Other long term (current) drug therapy: Secondary | ICD-10-CM

## 2022-01-23 MED ORDER — METOPROLOL SUCCINATE ER 25 MG PO TB24: ORAL_TABLET | ORAL | 3 refills | Status: DC

## 2022-01-23 NOTE — Progress Notes (Signed)
Evaluation Performed:  Follow-up visit  Date:  01/23/2022   ID:  Betty Rodriguez, DOB 1971-09-11, MRN [id number]  Patient Location:  [address] [place] [address]   Provider location:   [facility] [place], [place] office  PCP:  Virginia Crews, MD  Cardiologist:  Patsy Baltimore   Chief Complaint  Patient presents with   12 month follow up     "Doing well." Medications reviewed by the patient verbally.     History of Present Illness:    Betty Rodriguez is a 51 y.o. female with history of SVT in 03/2017,  HTN,  anxiety  admission to [facility] from 5/23-5/24/18 for allergic reaction (after shot, benadryl) and palpations noted to have SVT Who presents for follow-up of her arrhythmia/SVT   LOV 7/21 Feels well, no SVT Active, works at [employer]  Total cholesterol elevated 230, LDL 140  CT abdomen pelvis pulled up and reviewed, no aortic atherosclerosis, no iliac artery atherosclerosis  Discussed family history Grandfather died early in life, late 24s Father with CAD, stents, smoker  EKG personally reviewed by myself on todays visit Normal sinus rhythm rate 77 bpm short PR interval no significant ST-T wave changes  Past medical history reviewed Seen in August 2019  second SVT episode  Got overheated,    episode of tachycardia November 2018 acute onset of tachycardia rate 160 bpm,  was watching TV, ate pizza,, 2:30 p.m.  acute tachycardia Rate 165,  Took diltiazem metoprolol xl 25   Echocardiogram performed on 6/13 showed normal LV systolic function with EF of 55-60%, normal wall motion, normal LV diastolic function parameters.   Past Medical History:  Diagnosis Date   Allergy    Anxiety    Chicken pox    History of cold sores    Kidney stones    SVT (supraventricular tachycardia) ([place])    a. diagnosed 03/2017 in setting of hypokalemia   Vaginal Pap smear, abnormal    Past Surgical History:  Procedure Laterality Date   BREAST  BIOPSY Right 07/06/2016   us/ core/neg-BENIGN BREAST TISSUE    TONSILLECTOMY  1983     Allergies:   Amlodipine, Sertraline, and Sulfa antibiotics   Social History   Tobacco Use   Smoking status: Never   Smokeless tobacco: Never  Vaping Use   Vaping Use: Never used  Substance Use Topics   Alcohol use: Yes    Alcohol/week: 0.0 standard drinks    Comment: Rare    Drug use: No     Current Outpatient Medications on File Prior to Visit  Medication Sig Dispense Refill   JUNEL FE 1/20 1-20 MG-MCG tablet Take 1 tablet by mouth daily. Take 4 packs continuously and then break for 1 week 112 tablet 0   metoprolol succinate (TOPROL-XL) 25 MG 24 hr tablet Take 0.5 tablet (12.5 mg) by mouth once daily, you may take an extra 0.5 tablet (12.5 mg) once daily as needed for breakthrough tachycardia 15 tablet 0   valACYclovir (VALTREX) 1000 MG tablet Take 1 tablet (1,000 mg total) by mouth 2 (two) times daily. for 3 days as needed 20 tablet 2   No current facility-administered medications on file prior to visit.     Family Hx: The patient's family history includes Breast cancer in her paternal aunt; Diabetes in her maternal grandfather and maternal grandmother; Healthy in her mother; Heart disease in her maternal grandfather and maternal grandmother; Hyperlipidemia in her father; Hypertension in  her father, maternal grandfather, and maternal grandmother; Multiple sclerosis in her paternal grandmother; Stroke (age of onset: 80) in her maternal grandmother. There is no history of Colon cancer or Ovarian cancer.  ROS:   Please see the history of present illness.    Review of Systems  Constitutional: Negative.   HENT: Negative.    Respiratory: Negative.    Cardiovascular: Negative.   Gastrointestinal: Negative.   Musculoskeletal: Negative.   Neurological: Negative.   Psychiatric/Behavioral: Negative.    All other systems reviewed and are negative.  Labs/Other Tests and Data Reviewed:    Recent  Labs: No results found for requested labs within last 8760 hours.   Recent Lipid Panel Lab Results  Component Value Date/Time   CHOL 232 (H) 09/04/2020 01:52 PM   TRIG 103 09/04/2020 01:52 PM   HDL 74 09/04/2020 01:52 PM   CHOLHDL 3.1 09/04/2020 01:52 PM   LDLCALC 140 (H) 09/04/2020 01:52 PM    Wt Readings from Last 3 Encounters:  01/23/22 127 lb (57.6 kg)  05/27/21 128 lb (58.1 kg)  10/15/20 130 lb 9.6 oz (59.2 kg)     Exam:    Vital Signs: Vital signs may also be detailed in the HPI BP 120/80 (BP Location: Left Arm, Patient Position: Sitting, Cuff Size: Normal)    Pulse 77    Ht 5' 3"  (1.6 m)    Wt 127 lb (57.6 kg)    SpO2 98%    BMI 22.50 kg/m   Constitutional:  oriented to person, place, and time. No distress.  HENT:  Head: Grossly normal Eyes:  no discharge. No scleral icterus.  Neck: No JVD, no carotid bruits  Cardiovascular: Regular rate and rhythm, no murmurs appreciated Pulmonary/Chest: Clear to auscultation bilaterally, no wheezes or rails Abdominal: Soft.  no distension.  no tenderness.  Musculoskeletal: Normal range of motion Neurological:  normal muscle tone. Coordination normal. No atrophy Skin: Skin warm and dry Psychiatric: normal affect, pleasant   ASSESSMENT & PLAN:    Problem List Items Addressed This Visit       Cardiology Problems   Hypertension   SVT (supraventricular tachycardia) ([facility]) - Primary   Atrial tachycardia ([facility])     Other   Generalized anxiety disorder   Other Visit Diagnoses     SOB (shortness of breath)       Essential hypertension          Atrial tachycardia/SVT Recommend she continue metoprolol succinate 12.5 mg daily, extra half dose metoprolol as needed for breakthrough palpitations No recent episodes of paroxysmal tachycardia  Hyperlipidemia No aortic atherosclerosis noted on CT scan 2021 Strong family history father, grandfather We have laced an order for repeat lipid panel through [facility] Depending on his  numbers, potentially could try low-dose Crestor or Zetia Discussed other screening such as calcium scoring    Total encounter time more than 30 minutes  Greater than 50% was spent in counseling and coordination of care with the patient    Signed, Ida Rogue, [place] [address], [place], [place] [address]

## 2022-01-23 NOTE — Patient Instructions (Addendum)
Medication Instructions:  No changes  But think about low dose crestor or Zetia (non-statin option)  If you need a refill on your cardiac medications before your next appointment, please call your pharmacy.   Lab work: [facility]: lipid panel  Testing/Procedures: No new testing needed  Follow-Up: At [facility], you and your health needs are our priority.  As part of our continuing mission to provide you with exceptional heart care, we have created designated Provider Care Teams.  These Care Teams include your primary Cardiologist (physician) and Advanced Practice Providers (APPs -  Physician Assistants and Nurse Practitioners) who all work together to provide you with the care you need, when you need it.  You will need a follow up appointment in 12 months  Providers on your designated Care Team:   Murray Hodgkins, NP Christell Faith, PA-C Cadence Kathlen Mody, [place]  COVID-19 Vaccine Information can be found at: [email or web address].uk For questions related to vaccine distribution or appointments, please email [email or web address] or call 9595412618.

## 2022-01-26 ENCOUNTER — Other Ambulatory Visit: Payer: Self-pay | Admitting: *Deleted

## 2022-01-26 DIAGNOSIS — E782 Mixed hyperlipidemia: Secondary | ICD-10-CM

## 2022-01-26 DIAGNOSIS — Z8249 Family history of ischemic heart disease and other diseases of the circulatory system: Secondary | ICD-10-CM

## 2022-02-05 ENCOUNTER — Other Ambulatory Visit: Payer: Self-pay

## 2022-02-05 ENCOUNTER — Ambulatory Visit
Admission: RE | Admit: 2022-02-05 | Discharge: 2022-02-05 | Disposition: A | Discharge status: Home / Self Care | Payer: Managed Care, Other (non HMO) | Source: Ambulatory Visit | Attending: Family Medicine | Admitting: Family Medicine

## 2022-02-05 DIAGNOSIS — Z1231 Encounter for screening mammogram for malignant neoplasm of breast: Secondary | ICD-10-CM | POA: Diagnosis not present

## 2022-02-11 ENCOUNTER — Other Ambulatory Visit: Payer: Self-pay | Admitting: Cardiovascular Disease

## 2022-02-12 LAB — LIPID PANEL
Chol/HDL Ratio: 3.4 ratio (ref 0.0–4.4)
Cholesterol, Total: 191 mg/dL (ref 100–199)
HDL: 57 mg/dL (ref >39)
LDL Chol Calc (NIH): 114 mg/dL — ABNORMAL HIGH (ref 0–99)
Triglycerides: 114 mg/dL (ref 0–149)
VLDL Cholesterol Cal: 20 mg/dL (ref 5–40)

## 2022-04-10 ENCOUNTER — Other Ambulatory Visit: Payer: Self-pay | Admitting: Family Medicine

## 2022-04-10 DIAGNOSIS — N926 Irregular menstruation, unspecified: Secondary | ICD-10-CM

## 2022-05-07 IMAGING — MG MM DIGITAL SCREENING BILAT W/ TOMO AND CAD
6 of 10 series · 6 of 30 positions shown · non-contrast
Comparison: Previous exam(s).

CLINICAL DATA: Screening.

EXAM:
DIGITAL SCREENING BILATERAL MAMMOGRAM WITH TOMOSYNTHESIS AND CAD
TECHNIQUE: Bilateral screening digital craniocaudal and mediolateral oblique
mammograms were obtained. Bilateral screening digital breast
tomosynthesis was performed. The images were evaluated with
computer-aided detection.

[L CC synth-2D (1 of 2)]
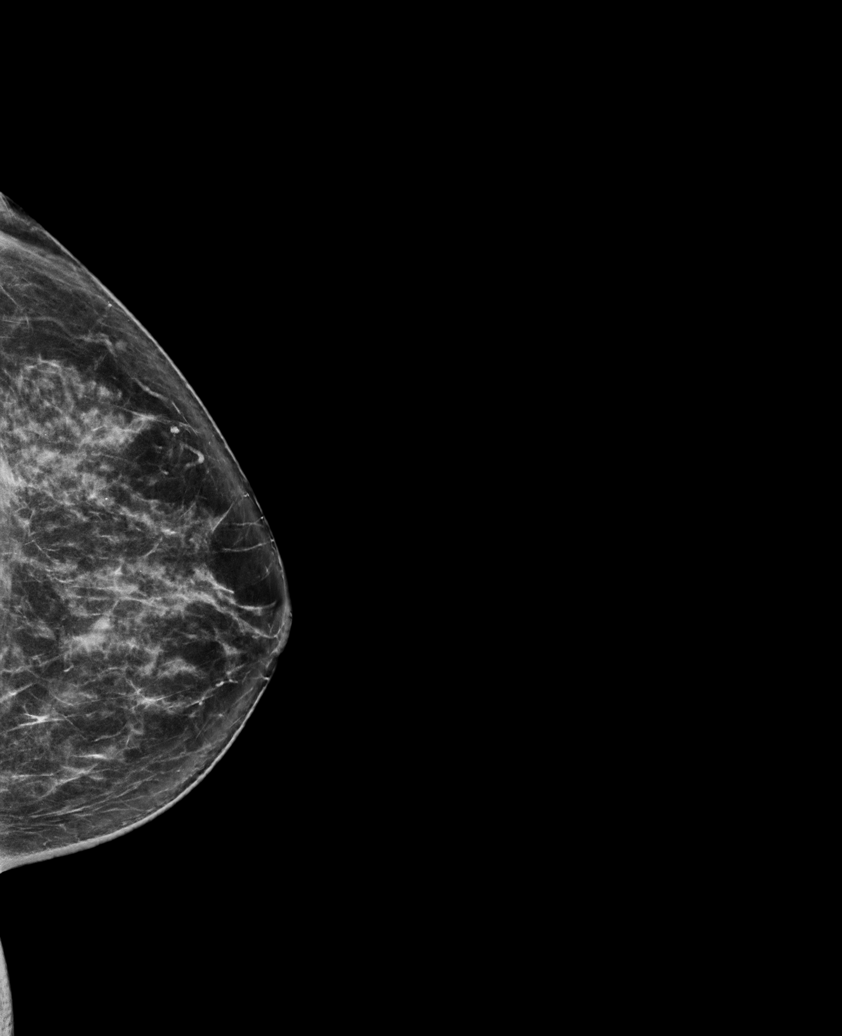

[R MLO synth-2D]
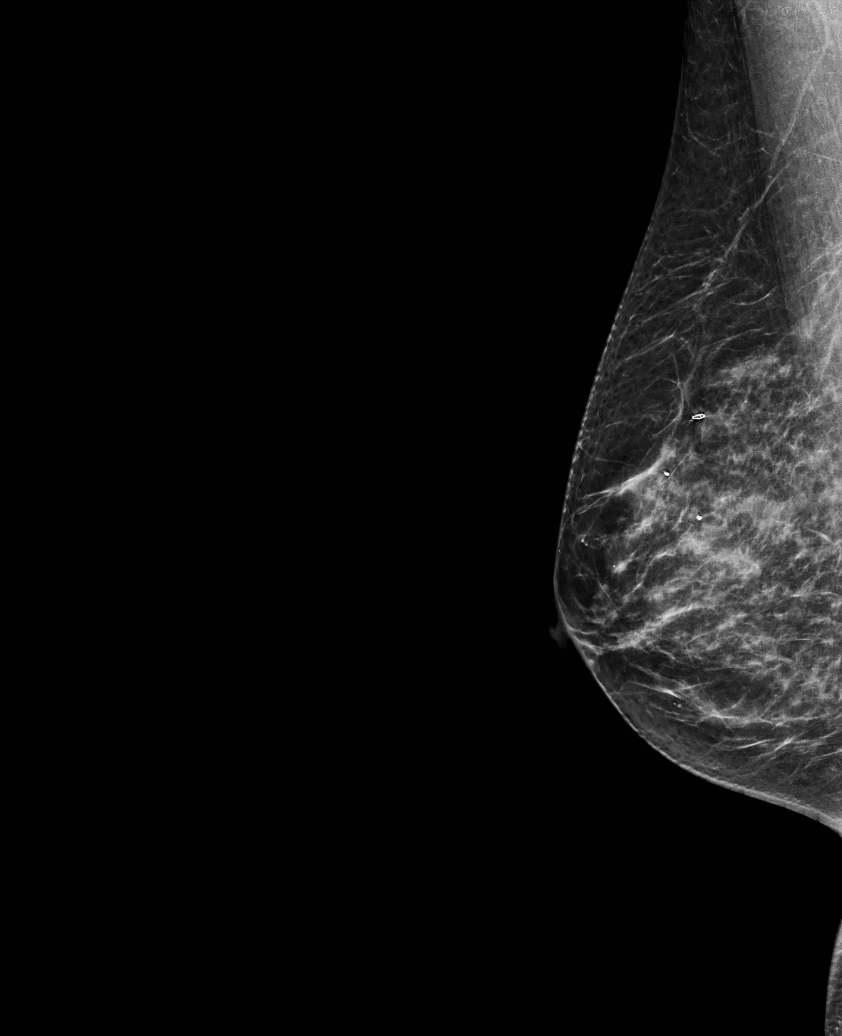

[R CC synth-2D]
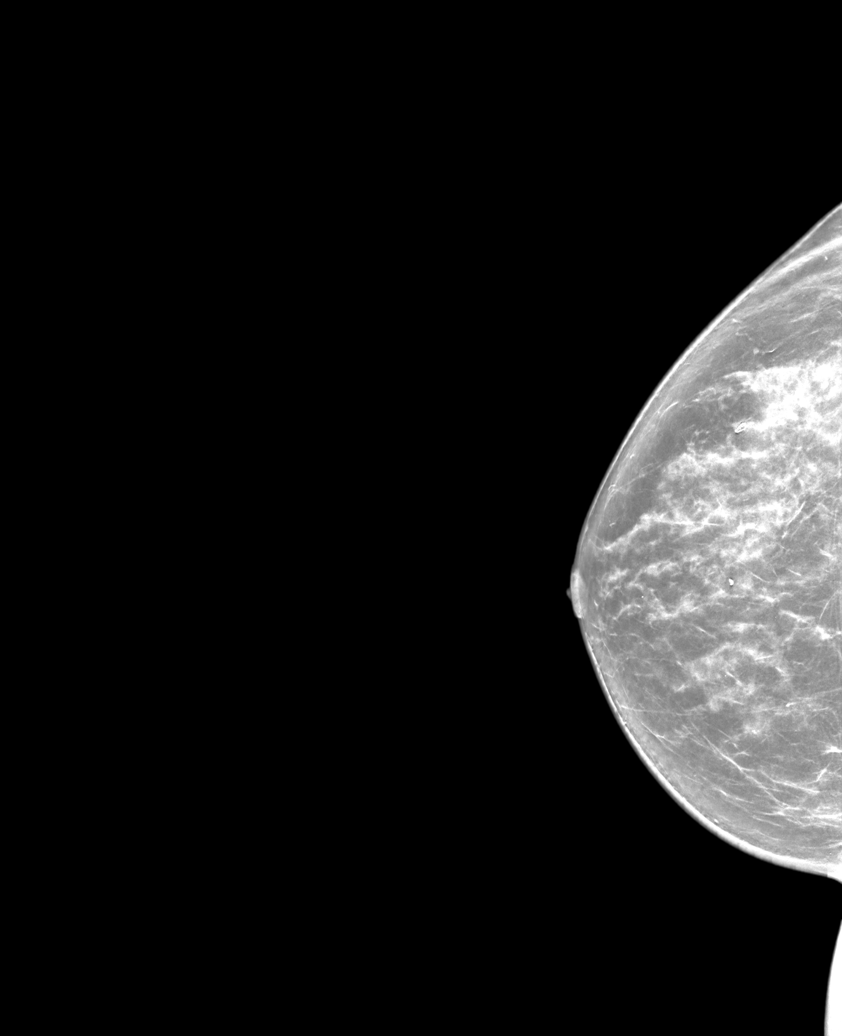

[L CC synth-2D (2 of 2)]
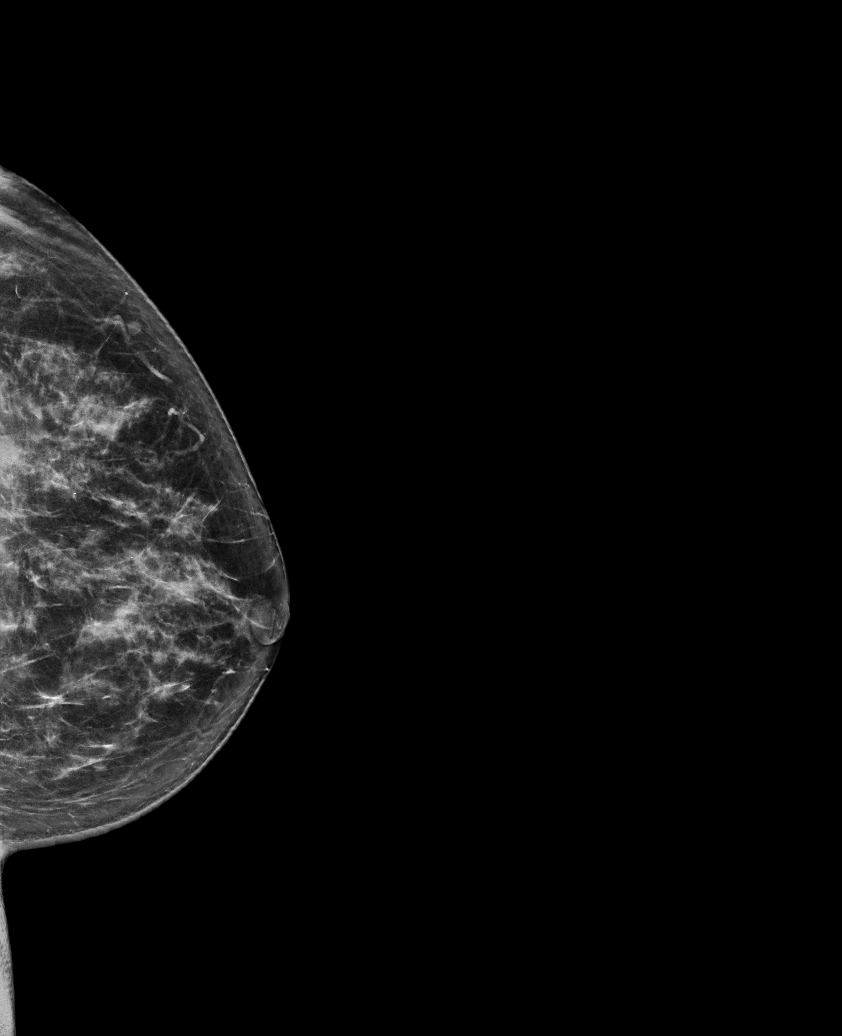

[L MLO synth-2D]
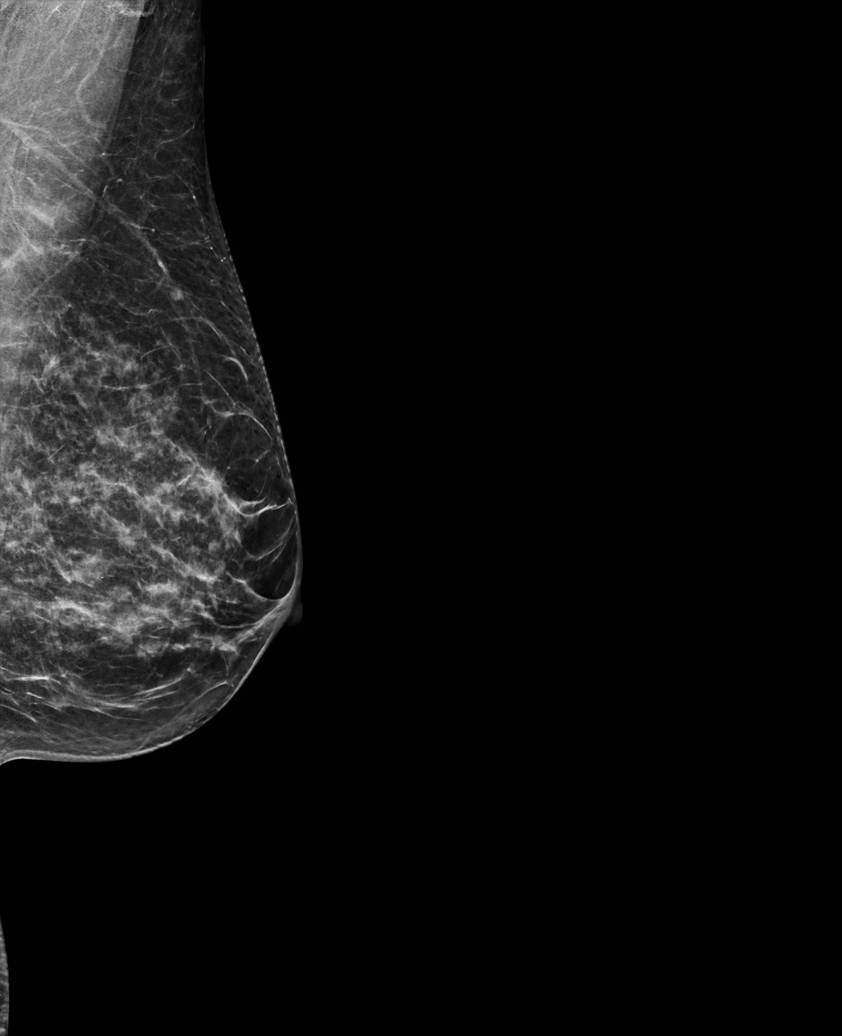

[R MLO tomo · tomo slice 33/64.0]
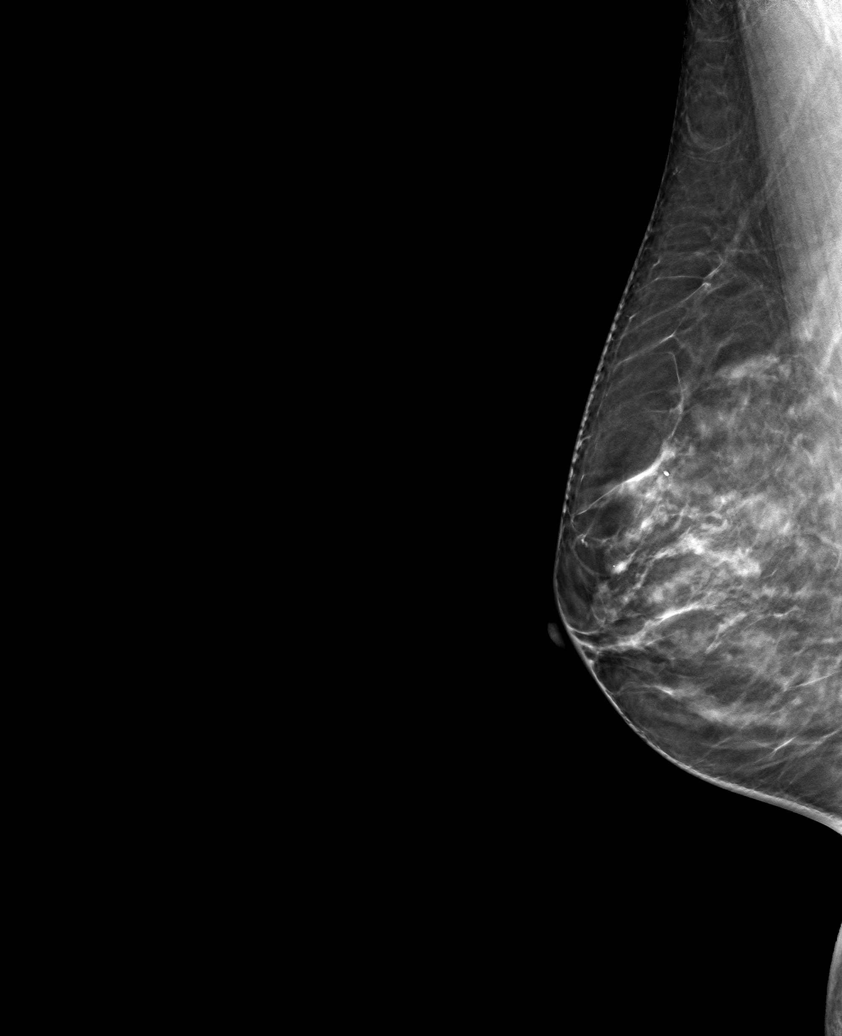

[6 of 30 positions shown; findings below may reference images not displayed]

ACR Breast Density Category c: The breast tissue is heterogeneously
dense, which may obscure small masses.
FINDINGS: There are no findings suspicious for malignancy.
IMPRESSION: No mammographic evidence of malignancy. A result letter of this
screening mammogram will be mailed directly to the patient.

RECOMMENDATION:
Screening mammogram in one year. (Code:Q3-W-BC3)

BI-RADS CATEGORY  1: Negative.

## 2022-05-12 NOTE — Progress Notes (Signed)
Betty Rodriguez,acting as a Education administrator for Betty Paganini, MD.,have documented all relevant documentation on the behalf of Betty Paganini, MD,as directed by  Betty Paganini, MD while in the presence of Betty Paganini, MD.   Complete physical exam   Patient: Modelle Rodriguez   DOB: 14-Mar-1971   51 y.o. Female  MRN: [id number] Visit Date: 05/14/2022  Today's healthcare provider: Lavon Paganini, MD   Chief Complaint  Patient presents with   Annual Exam   Subjective    Betty Rodriguez is a 51 y.o. female who presents today for a complete physical exam.  She reports consuming a general diet.  Walking over 10,000 steps daily.  She generally feels well. She reports sleeping poorly. She does have additional problems to discuss today.  HPI  Followed by GYN - h/o abnormal pap with negative Colpo  Working 3rd shift for a long time and experiencing more anxiety and irritability lately.  Having hot flashes and night sweats.  She is on OCPs, but not bleeding much between packs. She wonders if she is menopausal.  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  Past Medical History:  Diagnosis Date   Allergy    Anxiety    Chicken pox    History of cold sores    Kidney stones    SVT (supraventricular tachycardia) ([place])    a. diagnosed 03/2017 in setting of hypokalemia   Vaginal Pap smear, abnormal    Past Surgical History:  Procedure Laterality Date   BREAST BIOPSY Right 07/06/2016   us/ core/neg-BENIGN BREAST TISSUE    TONSILLECTOMY  1983   US GUIDED CYST ASP BREAST R ([place] [facility]) Right 12/09/2020   cyst asp   Social History   Socioeconomic History   Marital status: Divorced    Spouse name: Not on file   Number of children: 3   Years of education: Not on file   Highest education level: Not on file  Occupational History   Occupation: Microbiologist  Tobacco Use   Smoking status: Never   Smokeless tobacco: Never   Vaping Use   Vaping Use: Never used  Substance and Sexual Activity   Alcohol use: Yes    Alcohol/week: 0.0 standard drinks of alcohol    Comment: Rare    Drug use: No   Sexual activity: Yes    Partners: Male    Birth control/protection: OCP    Comment: 1 partner   Other Topics Concern   Not on file  Social History Narrative   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    Caffeine- soda diet 1 can, water, no coffee/tea   Works 3rd shift    2 sons and a daughter (25, 47, 22- daughter)       Social Determinants of Radio broadcast assistant Strain: Not on Art therapist Insecurity: Not on file  Transportation Needs: Not on file  Physical Activity: Not on file  Stress: Not on file  Social Connections: Not on file  Intimate Partner Violence: Not on file   Family Status  Relation Name Status   Mother  Alive   Father  Alive   Brother  Alive   MGM  (Not Specified)   MGF  (Not Specified)   PGM  (Not Specified)   Betty Rodriguez  (Not Specified)   Neg Hx  (Not Specified)   Family History  Problem Relation Age of Onset  Healthy Mother    Hyperlipidemia Father    Hypertension Father    Diabetes Maternal Grandmother    Hypertension Maternal Grandmother    Heart disease Maternal Grandmother    Stroke Maternal Grandmother 70   Diabetes Maternal Grandfather    Hypertension Maternal Grandfather    Heart disease Maternal Grandfather    Multiple sclerosis Paternal Grandmother    Breast cancer Paternal Aunt    Colon cancer Neg Hx    Ovarian cancer Neg Hx    Allergies  Allergen Reactions   Amlodipine Hives and Shortness Of Breath   Sertraline Hives    SVT SVT   Sulfa Antibiotics Hives and Shortness Of Breath    Patient Care Team: Betty Crews, MD as PCP - General (Family Medicine)   Medications: Outpatient Medications Prior to Visit  Medication Sig   BLISOVI FE 1/20 1-20 MG-MCG tablet TAKE 1 TABLET BY MOUTH EVERY DAY CONTINUOUSLY    metoprolol succinate (TOPROL-XL) 25 MG 24 hr tablet Take 0.5 tablet (12.5 mg) by mouth once daily, you may take an extra 0.5 tablet (12.5 mg) once daily as needed for breakthrough tachycardia   valACYclovir (VALTREX) 1000 MG tablet Take 1 tablet (1,000 mg total) by mouth 2 (two) times daily. for 3 days as needed   No facility-administered medications prior to visit.    Review of Systems  Constitutional:  Positive for diaphoresis and fatigue.  HENT:  Positive for ear pain.   Gastrointestinal:  Positive for constipation and diarrhea.  Endocrine: Positive for heat intolerance.  Neurological:  Positive for dizziness.  Psychiatric/Behavioral:  The patient is nervous/anxious.   All other systems reviewed and are negative.   Last CBC Lab Results  Component Value Date   WBC 9.5 10/04/2020   HGB 12.3 10/04/2020   HCT 36.5 10/04/2020   MCV 90 10/04/2020   MCH 30.4 10/04/2020   RDW 12.1 10/04/2020   PLT 269 67/10/4579   Last metabolic panel Lab Results  Component Value Date   GLUCOSE 93 10/04/2020   NA 141 10/04/2020   K 4.0 10/04/2020   CL 103 10/04/2020   CO2 21 10/04/2020   BUN 8 10/04/2020   CREATININE 0.77 10/04/2020   GFRNONAA 92 10/04/2020   CALCIUM 9.1 10/04/2020   PROT 6.8 10/04/2020   ALBUMIN 4.2 10/04/2020   LABGLOB 2.6 10/04/2020   AGRATIO 1.6 10/04/2020   BILITOT 0.2 10/04/2020   ALKPHOS 42 (L) 10/04/2020   AST 17 10/04/2020   ALT 14 10/04/2020   ANIONGAP 10 04/20/2017   Last lipids Lab Results  Component Value Date   CHOL 191 02/11/2022   HDL 57 02/11/2022   LDLCALC 114 (H) 02/11/2022   TRIG 114 02/11/2022   CHOLHDL 3.4 02/11/2022   Last hemoglobin A1c Lab Results  Component Value Date   HGBA1C 5.5 09/04/2020   Last thyroid functions Lab Results  Component Value Date   TSH 2.720 09/04/2020   Last vitamin D Lab Results  Component Value Date   VD25OH 28.5 (L) 07/11/2018      Objective     BP 136/78 (BP Location: Left Arm, Patient Position:  Sitting, Cuff Size: Normal)   Pulse 78   Temp 98.4 F (36.9 C)   Resp 16   Ht 5' 3"  (1.6 m)   Wt 119 lb 12.8 oz (54.3 kg)   BMI 21.22 kg/m  BP Readings from Last 3 Encounters:  05/14/22 136/78  01/23/22 120/80  08/28/21 (!) 168/89   Wt Readings from Last  3 Encounters:  05/14/22 119 lb 12.8 oz (54.3 kg)  01/23/22 127 lb (57.6 kg)  05/27/21 128 lb (58.1 kg)      05/14/2022    3:06 PM 07/24/2020    2:42 PM 07/11/2019    9:49 AM  PHQ 2/9 Scores  PHQ - 2 Score 0 0 0  PHQ- 9 Score 0  5   Last fall risk screening    05/14/2022    3:05 PM  Fall Risk   Falls in the past year? 0  Number falls in past yr: 0  Injury with Fall? 0  Risk for fall due to : No Fall Risks  Follow up Falls evaluation completed   Last Audit-C alcohol use screening    05/14/2022    3:06 PM  Alcohol Use Disorder Test (AUDIT)  1. How often do you have a drink containing alcohol? 1  2. How many drinks containing alcohol do you have on a typical day when you are drinking? 0  3. How often do you have six or more drinks on one occasion? 0  AUDIT-C Score 1   A score of 3 or more in women, and 4 or more in men indicates increased risk for alcohol abuse, EXCEPT if all of the points are from question 1   Physical Exam Vitals reviewed.  Constitutional:      General: She is not in acute distress.    Appearance: Normal appearance. She is well-developed. She is not diaphoretic.  HENT:     Head: Normocephalic and atraumatic.     Right Ear: Tympanic membrane, ear canal and external ear normal.     Left Ear: Tympanic membrane, ear canal and external ear normal.     Nose: Nose normal.     Mouth/Throat:     Mouth: Mucous membranes are moist.     Pharynx: Oropharynx is clear. No oropharyngeal exudate.  Eyes:     General: No scleral icterus.    Conjunctiva/sclera: Conjunctivae normal.     Pupils: Pupils are equal, round, and reactive to light.  Neck:     Thyroid: No thyromegaly.  Cardiovascular:     Rate and  Rhythm: Normal rate and regular rhythm.     Pulses: Normal pulses.     Heart sounds: Normal heart sounds. No murmur heard. Pulmonary:     Effort: Pulmonary effort is normal. No respiratory distress.     Breath sounds: Normal breath sounds. No wheezing or rales.  Abdominal:     General: There is no distension.     Palpations: Abdomen is soft.     Tenderness: There is no abdominal tenderness.  Musculoskeletal:        General: No deformity.     Cervical back: Neck supple.     Right lower leg: No edema.     Left lower leg: No edema.  Lymphadenopathy:     Cervical: No cervical adenopathy.  Skin:    General: Skin is warm and dry.     Findings: No rash.  Neurological:     Mental Status: She is alert and oriented to person, place, and time. Mental status is at baseline.     Sensory: No sensory deficit.     Motor: No weakness.     Gait: Gait normal.  Psychiatric:        Mood and Affect: Mood normal.        Behavior: Behavior normal.        Thought Content: Thought content normal.  No results found for any visits on 05/14/22.  Assessment & Plan    Routine Health Maintenance and Physical Exam  Exercise Activities and Dietary recommendations  Goals   None     Immunization History  Administered Date(s) Administered   Influenza,inj,Quad PF,6+ Mos 08/30/2019, 07/24/2020, 10/01/2021   Influenza-Unspecified 10/15/2017, 09/09/2018   PFIZER(Purple Top)SARS-COV-2 Vaccination 02/02/2020, 03/13/2020   Tdap 05/23/2018    Health Maintenance  Topic Date Due   Zoster Vaccines- Shingrix (1 of 2) Never done   COVID-19 Vaccine (3 - Pfizer risk series) 04/10/2020   INFLUENZA VACCINE  06/30/2022   PAP SMEAR-Modifier  09/05/2023   Fecal DNA (Cologuard)  09/28/2023   MAMMOGRAM  02/06/2024   TETANUS/TDAP  05/23/2028   Hepatitis C Screening  Completed   HIV Screening  Completed   HPV VACCINES  Aged Out    Discussed health benefits of physical activity, and encouraged her to engage  in regular exercise appropriate for her age and condition.  Problem List Items Addressed This Visit       Cardiovascular and Mediastinum   Hypertension    Well controlled Continue current medications Recheck metabolic panel      Relevant Orders   Comprehensive metabolic panel   Hot flashes    May be menopausal symptoms Discussed that we cannot check hormones accurately while on OCPs Also discussed that hormone replacement is the treatment, so she can continue OCPs for now If worsening can f/u with GYN        Other   Generalized anxiety disorder    Chronic and worsening recently Affecting sleep, more stress at work Previously on cymbalta and buspar at different times She does not remember why she stopped the buspar Resume Buspar at low dose 5 mg BID      Relevant Medications   busPIRone (BUSPAR) 5 MG tablet   Other Visit Diagnoses     Encounter for annual physical exam    -  Primary        Return in about 1 year (around 05/15/2023) for CPE.     I, Betty Paganini, MD, have reviewed all documentation for this visit. The documentation on 05/14/22 for the exam, diagnosis, procedures, and orders are all accurate and complete.   Marjorie Deprey, Dionne Bucy, MD, MPH [facility]

## 2022-05-14 ENCOUNTER — Encounter: Payer: Self-pay | Admitting: Family Medicine

## 2022-05-14 ENCOUNTER — Ambulatory Visit (INDEPENDENT_AMBULATORY_CARE_PROVIDER_SITE_OTHER): Payer: Managed Care, Other (non HMO) | Admitting: Family Medicine

## 2022-05-14 VITALS — BP 136/78 | HR 78 | Temp 98.4°F | Resp 16 | Ht 63.0 in | Wt 119.8 lb

## 2022-05-14 DIAGNOSIS — Z23 Encounter for immunization: Secondary | ICD-10-CM | POA: Diagnosis not present

## 2022-05-14 DIAGNOSIS — I10 Essential (primary) hypertension: Secondary | ICD-10-CM

## 2022-05-14 DIAGNOSIS — R232 Flushing: Secondary | ICD-10-CM

## 2022-05-14 DIAGNOSIS — Z0000 Encounter for general adult medical examination without abnormal findings: Secondary | ICD-10-CM | POA: Diagnosis not present

## 2022-05-14 DIAGNOSIS — F411 Generalized anxiety disorder: Secondary | ICD-10-CM | POA: Diagnosis not present

## 2022-05-14 MED ORDER — BUSPIRONE HCL 5 MG PO TABS: 5.0000 mg | ORAL_TABLET | Freq: Two times a day (BID) | ORAL | 3 refills | Status: DC

## 2022-05-14 NOTE — Assessment & Plan Note (Signed)
Well controlled Continue current medications Recheck metabolic panel

## 2022-05-14 NOTE — Assessment & Plan Note (Signed)
Chronic and worsening recently Affecting sleep, more stress at work Previously on cymbalta and buspar at different times She does not remember why she stopped the buspar Resume Buspar at low dose 5 mg BID

## 2022-05-14 NOTE — Addendum Note (Signed)
Addended by: Shawna Orleans on: 05/14/2022 03:50 PM   Modules accepted: Orders

## 2022-05-14 NOTE — Assessment & Plan Note (Signed)
May be menopausal symptoms Discussed that we cannot check hormones accurately while on OCPs Also discussed that hormone replacement is the treatment, so she can continue OCPs for now If worsening can f/u with GYN

## 2022-05-15 LAB — COMPREHENSIVE METABOLIC PANEL
ALT: 16 IU/L (ref 0–32)
AST: 15 IU/L (ref 0–40)
Albumin/Globulin Ratio: 1.8 (ref 1.2–2.2)
Albumin: 4.4 g/dL (ref 3.8–4.8)
Alkaline Phosphatase: 37 IU/L — ABNORMAL LOW (ref 44–121)
BUN/Creatinine Ratio: 16 (ref 9–23)
BUN: 13 mg/dL (ref 6–24)
Bilirubin Total: 0.3 mg/dL (ref 0.0–1.2)
CO2: 22 mmol/L (ref 20–29)
Calcium: 9.3 mg/dL (ref 8.7–10.2)
Chloride: 103 mmol/L (ref 96–106)
Creatinine, Ser: 0.82 mg/dL (ref 0.57–1.00)
Globulin, Total: 2.5 g/dL (ref 1.5–4.5)
Glucose: 103 mg/dL — ABNORMAL HIGH (ref 70–99)
Potassium: 4.4 mmol/L (ref 3.5–5.2)
Sodium: 142 mmol/L (ref 134–144)
Total Protein: 6.9 g/dL (ref 6.0–8.5)
eGFR: 87 mL/min/{1.73_m2} (ref >59)

## 2022-05-29 ENCOUNTER — Other Ambulatory Visit: Payer: Self-pay | Admitting: Family Medicine

## 2022-05-29 DIAGNOSIS — B009 Herpesviral infection, unspecified: Secondary | ICD-10-CM

## 2022-05-29 NOTE — Telephone Encounter (Signed)
Requested Prescriptions  Pending Prescriptions Disp Refills  . valACYclovir (VALTREX) 1000 MG tablet [Pharmacy Med Name: VALACYCLOVIR 1GM TABLETS] 20 tablet 2    Sig: TAKE 1 TABLET(1000 MG) BY MOUTH TWICE DAILY FOR 3 DAYS AS NEEDED     Antimicrobials:  Antiviral Agents - Anti-Herpetic Passed - 05/29/2022  5:36 PM      Passed - Valid encounter within last 12 months    Recent Outpatient Visits          2 weeks ago Encounter for annual physical exam   [facility], Dionne Bucy, MD   9 months ago COVID-19 virus infection   [facility], Vickki Muff, PA-C   1 year ago LLQ pain   [facility], Dionne Bucy, MD   1 year ago LLQ pain   [facility], Kelby Aline, FNP   1 year ago Cervicitis- nonspecific    [facility], Kelby Aline, FNP

## 2022-05-31 ENCOUNTER — Other Ambulatory Visit: Payer: Self-pay | Admitting: Family Medicine

## 2022-05-31 DIAGNOSIS — N926 Irregular menstruation, unspecified: Secondary | ICD-10-CM

## 2022-07-30 ENCOUNTER — Ambulatory Visit: Payer: Managed Care, Other (non HMO) | Admitting: Family Medicine

## 2022-07-30 DIAGNOSIS — Z23 Encounter for immunization: Secondary | ICD-10-CM

## 2022-07-30 NOTE — Progress Notes (Signed)
Not seen by NP. Consented; VIS made available; no immediate side effects following administration  Jacky Kindle, FNP  [facility] [address] [place], [place] [address] [phone] (phone) [phone] (fax) [facility]

## 2022-09-29 ENCOUNTER — Telehealth: Payer: Self-pay | Admitting: Family Medicine

## 2022-09-29 DIAGNOSIS — N926 Irregular menstruation, unspecified: Secondary | ICD-10-CM

## 2022-09-29 MED ORDER — BLISOVI FE 1/20 1-20 MG-MCG PO TABS: ORAL_TABLET | ORAL | 0 refills | Status: DC

## 2022-09-29 NOTE — Telephone Encounter (Signed)
[facility] faxed refill request for the following medications:  BLISOVI FE 1/20 1-20 MG-MCG tablet    Please advise.

## 2023-01-12 ENCOUNTER — Ambulatory Visit: Payer: Managed Care, Other (non HMO) | Admitting: Family Medicine

## 2023-01-12 ENCOUNTER — Encounter: Payer: Self-pay | Admitting: Family Medicine

## 2023-01-12 VITALS — BP 126/76 | HR 63 | Temp 98.1°F | Resp 20 | Ht 63.0 in | Wt 121.0 lb

## 2023-01-12 DIAGNOSIS — F40248 Other situational type phobia: Secondary | ICD-10-CM | POA: Insufficient documentation

## 2023-01-12 DIAGNOSIS — Z1231 Encounter for screening mammogram for malignant neoplasm of breast: Secondary | ICD-10-CM

## 2023-01-12 DIAGNOSIS — F411 Generalized anxiety disorder: Secondary | ICD-10-CM

## 2023-01-12 DIAGNOSIS — Z91018 Allergy to other foods: Secondary | ICD-10-CM | POA: Diagnosis not present

## 2023-01-12 MED ORDER — DULOXETINE HCL 20 MG PO CPEP: 20.0000 mg | ORAL_CAPSULE | Freq: Every day | ORAL | 3 refills | Status: DC

## 2023-01-12 MED ORDER — PROPRANOLOL HCL 10 MG PO TABS: 10.0000 mg | ORAL_TABLET | Freq: Two times a day (BID) | ORAL | 1 refills | Status: DC | PRN

## 2023-01-12 NOTE — Assessment & Plan Note (Signed)
Recent reaction with facial swelling and hives after eating a deli sandwich overall think she is eaten previously Benadryl worked immediately Will refer to allergy for possible allergy testing

## 2023-01-12 NOTE — Progress Notes (Signed)
I,Sulibeya S Dimas,acting as a Education administrator for Lavon Paganini, MD.,have documented all relevant documentation on the behalf of Lavon Paganini, MD,as directed by  Lavon Paganini, MD while in the presence of Lavon Paganini, MD.     Established patient visit   Patient: Betty Rodriguez   DOB: 11/02/1971   52 y.o. Female  MRN: [id number] Visit Date: 01/12/2023  Today's healthcare provider: Lavon Paganini, MD   No chief complaint on file.  Subjective    HPI   Anxiety is high Feels like she has always had it and occasional panic symptoms Has been able to avoid triggers and keep it at [place] - has gottent worse though More work stress and presentations. Heart pounding, sweating, and had to leave work early t [employer] racing, hard to sleep  Predictable - social situations, presentations, public speaking  Buspar didn't seem to help Tried zoloft in 2018 - had a reaction, but may have been from amlodipine - broke out in hives  Had reaction with hives and facial swelling to eating deli sandwiches last week. Benadryl mad it better, but now scared about what to eat.  Medications: Outpatient Medications Prior to Visit  Medication Sig   BLISOVI FE 1/20 1-20 MG-MCG tablet TAKE 1 TABLET BY MOUTH EVERY DAY CONTINUOUSLY   metoprolol succinate (TOPROL-XL) 25 MG 24 hr tablet Take 0.5 tablet (12.5 mg) by mouth once daily, you may take an extra 0.5 tablet (12.5 mg) once daily as needed for breakthrough tachycardia   valACYclovir (VALTREX) 1000 MG tablet TAKE 1 TABLET(1000 MG) BY MOUTH TWICE DAILY FOR 3 DAYS AS NEEDED   [DISCONTINUED] busPIRone (BUSPAR) 5 MG tablet Take 1 tablet (5 mg total) by mouth 2 (two) times daily.   No facility-administered medications prior to visit.    Review of Systems per HPI     Objective    BP 126/76 (BP Location: Right Arm, Patient Position: Sitting, Cuff Size: Normal)   Pulse 63   Temp 98.1 F (36.7 C) (Temporal)   Resp 20    Ht 5' 3"$  (1.6 m)   Wt 121 lb (54.9 kg)   BMI 21.43 kg/m    Physical Exam Vitals reviewed.  Constitutional:      General: She is not in acute distress.    Appearance: Normal appearance. She is well-developed. She is not diaphoretic.  HENT:     Head: Normocephalic and atraumatic.  Eyes:     General: No scleral icterus.    Conjunctiva/sclera: Conjunctivae normal.  Neck:     Thyroid: No thyromegaly.  Cardiovascular:     Rate and Rhythm: Normal rate and regular rhythm.     Heart sounds: Normal heart sounds. No murmur heard. Pulmonary:     Effort: Pulmonary effort is normal. No respiratory distress.     Breath sounds: Normal breath sounds. No wheezing, rhonchi or rales.  Musculoskeletal:     Cervical back: Neck supple.     Right lower leg: No edema.     Left lower leg: No edema.  Lymphadenopathy:     Cervical: No cervical adenopathy.  Skin:    General: Skin is warm and dry.     Findings: No rash.  Neurological:     Mental Status: She is alert and oriented to person, place, and time. Mental status is at baseline.  Psychiatric:        Mood and Affect: Mood is anxious.      No results found for any visits on 01/12/23.  Assessment &  Plan     Problem List Items Addressed This Visit       Other   Generalized anxiety disorder - Primary    Longstanding and uncontrolled Failed BuSpar Had a reaction previously possibly to Zoloft, so avoiding SSRIs at this time Will Start Cymbalta 20 mg daily Discussed potential side effects Discussed that it can take 6-8 weeks to reach full efficacy Contracted for safety - no SI/HI Discussed synergistic effects of medications and therapy       Relevant Medications   DULoxetine (CYMBALTA) 20 MG capsule   Food allergy    Recent reaction with facial swelling and hives after eating a deli sandwich overall think she is eaten previously Benadryl worked immediately Will refer to allergy for possible allergy testing      Relevant Orders    Ambulatory referral to Allergy   Fear of public speaking    Underlying GAD, but exacerbated in the setting of public speaking/giving presentations Cymbalta as above Okay to use low-dose propranolol as needed for predicted times of heightened anxiety such as giving a presentation Okay to use this even though she is on low-dose metoprolol daily      Other Visit Diagnoses     Breast cancer screening by mammogram       Relevant Orders   MM 3D SCREEN BREAST BILATERAL        Return in about 6 weeks (around 02/23/2023) for MDD/GAD f/u, virtual ok.      I, Lavon Paganini, MD, have reviewed all documentation for this visit. The documentation on 01/12/23 for the exam, diagnosis, procedures, and orders are all accurate and complete.   Damoni Causby, Dionne Bucy, MD, MPH [facility]

## 2023-01-12 NOTE — Assessment & Plan Note (Signed)
Longstanding and uncontrolled Failed BuSpar Had a reaction previously possibly to Zoloft, so avoiding SSRIs at this time Will Start Cymbalta 20 mg daily Discussed potential side effects Discussed that it can take 6-8 weeks to reach full efficacy Contracted for safety - no SI/HI Discussed synergistic effects of medications and therapy

## 2023-01-12 NOTE — Assessment & Plan Note (Signed)
Underlying GAD, but exacerbated in the setting of public speaking/giving presentations Cymbalta as above Okay to use low-dose propranolol as needed for predicted times of heightened anxiety such as giving a presentation Okay to use this even though she is on low-dose metoprolol daily

## 2023-02-04 ENCOUNTER — Telehealth: Payer: Managed Care, Other (non HMO) | Admitting: Family Medicine

## 2023-02-04 DIAGNOSIS — U071 COVID-19: Secondary | ICD-10-CM

## 2023-02-04 MED ORDER — MOLNUPIRAVIR EUA 200MG CAPSULE: 4.0000 | ORAL_CAPSULE | Freq: Two times a day (BID) | ORAL | 0 refills | Status: AC

## 2023-02-04 NOTE — Progress Notes (Signed)
Virtual Visit via Video Note  I connected with Betty Rodriguez on 02/04/23 at  1:20 PM EST by a video enabled telemedicine application and verified that I am speaking with the correct person using two identifiers.  Location:  Patient location: home Provider location: [facility] Persons involved in the visit: patient, provider    I discussed the limitations of evaluation and management by telemedicine and the availability of in person appointments. The patient expressed understanding and agreed to proceed.  History of Present Illness:  Started with scratchy throat 2 days ago - worse yesterday - congestion, myalgias, headache Home test positive last night  BF sick with COVID also and antiviral is helping him tremendously   Observations/Objective:  Physical Exam Constitutional:      General: She is not in acute distress.    Appearance: Normal appearance.  HENT:     Head: Normocephalic.  Pulmonary:     Effort: Pulmonary effort is normal. No respiratory distress.  Neurological:     Mental Status: She is alert and oriented to person, place, and time. Mental status is at baseline.      Assessment and Plan:  1. COVID-19 - symptoms and exam c/w viral URI - no evidence of strep pharyngitis, CAP, AOM, bacterial sinusitis, or other bacterial infection -  positive for COVID19 - discussed natural course, symptomatic management, and return precautions - Good candidate for antiviral therapy - Rx'd today  Meds ordered this encounter  Medications   molnupiravir EUA (LAGEVRIO) 200 mg CAPS capsule    Sig: Take 4 capsules (800 mg total) by mouth 2 (two) times daily for 5 days.    Dispense:  40 capsule    Refill:  0      Follow Up Instructions:    I discussed the assessment and treatment plan with the patient. The patient was provided an opportunity to ask questions and all were answered. The patient agreed with the plan and demonstrated an understanding of the  instructions.   The patient was advised to call back or seek an in-person evaluation if the symptoms worsen or if the condition fails to improve as anticipated.  Braelen Sproule, Dionne Bucy, MD, MPH [facility]

## 2023-02-25 ENCOUNTER — Telehealth: Payer: Managed Care, Other (non HMO) | Admitting: Family Medicine

## 2023-02-25 ENCOUNTER — Encounter: Payer: Self-pay | Admitting: Family Medicine

## 2023-02-25 DIAGNOSIS — F411 Generalized anxiety disorder: Secondary | ICD-10-CM | POA: Diagnosis not present

## 2023-02-25 DIAGNOSIS — F40248 Other situational type phobia: Secondary | ICD-10-CM

## 2023-02-25 MED ORDER — DULOXETINE HCL 40 MG PO CPEP: 40.0000 mg | ORAL_CAPSULE | Freq: Every day | ORAL | 1 refills | Status: DC

## 2023-02-25 NOTE — Assessment & Plan Note (Signed)
Longstanding and uncontrolled Cymbalta was helping, but not anymore No SEs from meds Increase Cymbalta to 40mg  daily

## 2023-02-25 NOTE — Assessment & Plan Note (Signed)
Great improvement with propranolol - continue prn

## 2023-02-25 NOTE — Progress Notes (Signed)
I,Sulibeya S Dimas,acting as a Education administrator for Lavon Paganini, MD.,have documented all relevant documentation on the behalf of Lavon Paganini, MD,as directed by  Lavon Paganini, MD while in the presence of Lavon Paganini, MD.   MyChart Video Visit    Virtual Visit via Video Note   This format is felt to be most appropriate for this patient at this time. Physical exam was limited by quality of the video and audio technology used for the visit.    Patient location: home Provider location: [place] involved in the visit: patient, provider   I discussed the limitations of evaluation and management by telemedicine and the availability of in person appointments. The patient expressed understanding and agreed to proceed.  Patient: Betty Rodriguez   DOB: [birth year]   52 y.o. Female  MRN: [id number] Visit Date: 02/25/2023  Today's healthcare provider: Lavon Paganini, MD   Chief Complaint  Patient presents with   Anxiety   Subjective     Anxiety, Follow-up  She was last seen for anxiety 6 weeks ago. Changes made at last visit include start Cymbalta 20 mg daily.   She reports excellent compliance with treatment.  She reports excellent tolerance of treatment. She is not having side effects.   She feels her anxiety is mild and  improved for the first few weeks, now medication is not working  for the last week or so (questions whether she has actually really taken it b/c it doesn't feel like it and is back on edge).  GAD-7 Results    02/25/2023    1:47 PM  GAD-7 Generalized Anxiety Disorder Screening Tool  1. Feeling Nervous, Anxious, or on Edge 1  2. Not Being Able to Stop or Control Worrying 1  3. Worrying Too Much About Different Things 1  4. Trouble Relaxing 1  5. Being So Restless it's Hard To Sit Still 0  6. Becoming Easily Annoyed or Irritable 1  7. Feeling Afraid As If Something Awful Might Happen 0  Total GAD-7 Score 5   Difficulty At Work, Home, or Getting  Along With Others? Somewhat difficult    PHQ-9 Scores    02/25/2023    1:45 PM 01/12/2023    2:29 PM 05/14/2022    3:06 PM  PHQ9 SCORE ONLY  PHQ-9 Total Score 2 2 0    ---------------------------------------------------------------------------------------------------  Medications: Outpatient Medications Prior to Visit  Medication Sig   BLISOVI FE 1/20 1-20 MG-MCG tablet TAKE 1 TABLET BY MOUTH EVERY DAY CONTINUOUSLY   metoprolol succinate (TOPROL-XL) 25 MG 24 hr tablet Take 0.5 tablet (12.5 mg) by mouth once daily, you may take an extra 0.5 tablet (12.5 mg) once daily as needed for breakthrough tachycardia   propranolol (INDERAL) 10 MG tablet Take 1 tablet (10 mg total) by mouth 2 (two) times daily as needed.   valACYclovir (VALTREX) 1000 MG tablet TAKE 1 TABLET(1000 MG) BY MOUTH TWICE DAILY FOR 3 DAYS AS NEEDED   [DISCONTINUED] DULoxetine (CYMBALTA) 20 MG capsule Take 1 capsule (20 mg total) by mouth daily.   No facility-administered medications prior to visit.    Review of Systems  Constitutional:  Negative for appetite change, fatigue and unexpected weight change.  Respiratory:  Negative for cough, chest tightness and shortness of breath.   Cardiovascular:  Negative for chest pain.  Gastrointestinal:  Negative for abdominal pain, nausea and vomiting.  Psychiatric/Behavioral:  Positive for sleep disturbance. The patient is nervous/anxious.        Objective  There were no vitals taken for this visit.     Physical Exam Constitutional:      General: She is not in acute distress.    Appearance: Normal appearance.  HENT:     Head: Normocephalic.  Pulmonary:     Effort: Pulmonary effort is normal. No respiratory distress.  Neurological:     Mental Status: She is alert and oriented to person, place, and time. Mental status is at baseline.        Assessment & Plan     Problem List Items Addressed This Visit       Other    Generalized anxiety disorder - Primary    Longstanding and uncontrolled Cymbalta was helping, but not anymore No SEs from meds Increase Cymbalta to 40mg  daily      Relevant Medications   DULoxetine 40 MG CPEP   Fear of public speaking    Great improvement with propranolol - continue prn        Return in about 2 months (around 04/27/2023) for MDD/GAD f/u, virtual ok.     I discussed the assessment and treatment plan with the patient. The patient was provided an opportunity to ask questions and all were answered. The patient agreed with the plan and demonstrated an understanding of the instructions.   The patient was advised to call back or seek an in-person evaluation if the symptoms worsen or if the condition fails to improve as anticipated.  I, Lavon Paganini, MD, have reviewed all documentation for this visit. The documentation on 02/25/23 for the exam, diagnosis, procedures, and orders are all accurate and complete.   Miguel Christiana, Dionne Bucy, MD, MPH [facility]

## 2023-04-13 ENCOUNTER — Ambulatory Visit
Admission: RE | Admit: 2023-04-13 | Discharge: 2023-04-13 | Disposition: A | Discharge status: Home / Self Care | Payer: Managed Care, Other (non HMO) | Source: Ambulatory Visit | Attending: Family Medicine | Admitting: Family Medicine

## 2023-04-13 ENCOUNTER — Other Ambulatory Visit: Payer: Self-pay | Admitting: Family Medicine

## 2023-04-13 DIAGNOSIS — N926 Irregular menstruation, unspecified: Secondary | ICD-10-CM

## 2023-04-13 DIAGNOSIS — Z1231 Encounter for screening mammogram for malignant neoplasm of breast: Secondary | ICD-10-CM

## 2023-04-13 MED ORDER — BLISOVI FE 1/20 1-20 MG-MCG PO TABS: ORAL_TABLET | ORAL | 0 refills | Status: DC

## 2023-04-16 ENCOUNTER — Telehealth: Payer: Self-pay | Admitting: Cardiovascular Disease

## 2023-04-16 MED ORDER — METOPROLOL SUCCINATE ER 25 MG PO TB24: ORAL_TABLET | ORAL | 1 refills | Status: DC

## 2023-04-16 NOTE — Telephone Encounter (Signed)
Requested Prescriptions   Signed Prescriptions Disp Refills   metoprolol succinate (TOPROL-XL) 25 MG 24 hr tablet 60 tablet 1    Sig: Take 0.5 tablet (12.5 mg) by mouth once daily, you may take an extra 0.5 tablet (12.5 mg) once daily as needed for breakthrough tachycardia    Authorizing Provider: Antonieta Iba    Ordering User: Thayer Headings, Marlow Berenguer L

## 2023-04-16 NOTE — Telephone Encounter (Signed)
*  STAT* If patient is at the pharmacy, call can be transferred to refill team.   1. Which medications need to be refilled? (please list name of each medication and dose if known) Metoprolol  2. Which pharmacy/location (including street and city if local pharmacy) is medication to be sent to?  [facility] [place] and [address], [place]  3. Do they need a 30 day or 90 day supply? Enough her appointment on 06-07-23

## 2023-04-28 NOTE — Progress Notes (Unsigned)
      Established patient visit   Patient: Betty Rodriguez   DOB: 07-Mar-1971   52 y.o. Female  MRN: [id number] Visit Date: 04/29/2023  Today's healthcare provider: Shirlee Latch, MD   No chief complaint on file.  Subjective    Cough   ***  Medications: Outpatient Medications Prior to Visit  Medication Sig  . BLISOVI FE 1/20 1-20 MG-MCG tablet TAKE 1 TABLET BY MOUTH EVERY DAY CONTINUOUSLY  . DULoxetine 40 MG CPEP Take 1 capsule (40 mg total) by mouth daily.  . metoprolol succinate (TOPROL-XL) 25 MG 24 hr tablet Take 0.5 tablet (12.5 mg) by mouth once daily, you may take an extra 0.5 tablet (12.5 mg) once daily as needed for breakthrough tachycardia  . propranolol (INDERAL) 10 MG tablet Take 1 tablet (10 mg total) by mouth 2 (two) times daily as needed.  . valACYclovir (VALTREX) 1000 MG tablet TAKE 1 TABLET(1000 MG) BY MOUTH TWICE DAILY FOR 3 DAYS AS NEEDED   No facility-administered medications prior to visit.    Review of Systems  Respiratory:  Positive for cough.    {Labs  Heme  Chem  Endocrine  Serology  Results Review (optional):23779}   Objective    There were no vitals taken for this visit. {Show previous vital signs (optional):23777}  Physical Exam  ***  No results found for any visits on 04/29/23.  Assessment & Plan     ***  No follow-ups on file.      {provider attestation***:1}   Shirlee Latch, MD  [facility] [phone] (phone) [phone] (fax)  [facility]

## 2023-04-29 ENCOUNTER — Telehealth: Payer: Managed Care, Other (non HMO) | Admitting: Family Medicine

## 2023-04-29 ENCOUNTER — Encounter: Payer: Self-pay | Admitting: Family Medicine

## 2023-04-29 ENCOUNTER — Ambulatory Visit (INDEPENDENT_AMBULATORY_CARE_PROVIDER_SITE_OTHER): Payer: Managed Care, Other (non HMO) | Admitting: Family Medicine

## 2023-04-29 VITALS — BP 156/98 | HR 93 | Resp 16 | Wt 116.3 lb

## 2023-04-29 DIAGNOSIS — J40 Bronchitis, not specified as acute or chronic: Secondary | ICD-10-CM

## 2023-04-29 DIAGNOSIS — F411 Generalized anxiety disorder: Secondary | ICD-10-CM | POA: Diagnosis not present

## 2023-04-29 MED ORDER — DULOXETINE HCL 40 MG PO CPEP: 40.0000 mg | ORAL_CAPSULE | Freq: Every day | ORAL | 1 refills | Status: DC

## 2023-04-29 MED ORDER — PREDNISONE 20 MG PO TABS: 40.0000 mg | ORAL_TABLET | Freq: Every day | ORAL | 0 refills | Status: AC

## 2023-04-29 MED ORDER — ALBUTEROL SULFATE HFA 108 (90 BASE) MCG/ACT IN AERS: 2.0000 | INHALATION_SPRAY | Freq: Four times a day (QID) | RESPIRATORY_TRACT | 0 refills | Status: DC | PRN

## 2023-04-29 NOTE — Assessment & Plan Note (Signed)
Well controlled at today's visit.  Continue Cymbalta 40 mg daily.

## 2023-04-29 NOTE — Progress Notes (Signed)
Acute Office Visit  Subjective:     Patient ID: Betty Rodriguez, female    DOB: 01-10-1971, 52 y.o.   MRN: [id number]  Chief Complaint  Patient presents with   Cough    HPI Cough Patient is in today for 3 week history of non-productive cough. Cough is worse at night, works 3rd shift and there is poor ventilation at work. Has developed chest tightness and discomfort. Partner recently had a viral respiratory illness. Partner has recovered.   Anxiety Increased Cymbalta to 40 mg in March. Patient reports improvement in symptoms.    Review of Systems  Constitutional:  Positive for malaise/fatigue. Negative for chills and fever.  HENT:  Negative for congestion, ear discharge, sinus pain and sore throat.   Respiratory:  Positive for cough and shortness of breath. Negative for hemoptysis and wheezing.   Cardiovascular:  Positive for chest pain. Negative for palpitations and leg swelling.  Neurological:  Negative for dizziness and headaches.        Objective:    BP (!) 156/98 (BP Location: Right Arm, Patient Position: Sitting, Cuff Size: Normal)   Pulse 93   Resp 16   Wt 116 lb 4.8 oz (52.8 kg)   SpO2 96%   BMI 20.60 kg/m    Physical Exam Constitutional:      General: She is not in acute distress. Cardiovascular:     Pulses: Normal pulses.     Heart sounds: Normal heart sounds.  Pulmonary:     Effort: Pulmonary effort is normal.     Breath sounds: Wheezing present.  Musculoskeletal:     Cervical back: Normal range of motion.  Skin:    General: Skin is warm and dry.     Capillary Refill: Capillary refill takes less than 2 seconds.  Neurological:     Mental Status: She is alert.     No results found for any visits on 04/29/23.      Assessment & Plan:   Problem List Items Addressed This Visit     Generalized anxiety disorder    Well controlled at today's visit.  Continue Cymbalta 40 mg daily.       Relevant Medications   DULoxetine HCl 40 MG CPEP    Other Visit Diagnoses     Bronchitis    -  Primary  History and physical exam c/w with bronchitis.  Given extent of shortness of breath and wheezing on exam, start prednisone 40 mg daily for 7 days.  Use albuterol inhaler as needed.  Patient advised to return if symptoms worsen or fail to improve.      Meds ordered this encounter  Medications   albuterol (VENTOLIN HFA) 108 (90 Base) MCG/ACT inhaler    Sig: Inhale 2 puffs into the lungs every 6 (six) hours as needed for wheezing or shortness of breath.    Dispense:  8 g    Refill:  0   predniSONE (DELTASONE) 20 MG tablet    Sig: Take 2 tablets (40 mg total) by mouth daily with breakfast for 7 days.    Dispense:  14 tablet    Refill:  0   DULoxetine HCl 40 MG CPEP    Sig: Take 1 capsule (40 mg total) by mouth daily.    Dispense:  90 capsule    Refill:  1    Return in about 6 weeks (around 06/10/2023) for as scheduled, CPE.  Gilmer Mor, Medical Student  Patient seen along with MS3 student Betty Rodriguez.  I personally evaluated this patient along with the student, and verified all aspects of the history, physical exam, and medical decision making as documented by the student. I agree with the student's documentation and have made all necessary edits.  Thomas Rhude, Marzella Schlein, MD, MPH [facility]

## 2023-05-19 ENCOUNTER — Other Ambulatory Visit: Payer: Self-pay | Admitting: Family Medicine

## 2023-05-19 NOTE — Telephone Encounter (Signed)
Requested Prescriptions  Pending Prescriptions Disp Refills   albuterol (VENTOLIN HFA) 108 (90 Base) MCG/ACT inhaler [Pharmacy Med Name: ALBUTEROL HFA INH (200 PUFFS) 6.7GM] 8 g 1    Sig: INHALE 2 PUFFS INTO THE LUNGS EVERY 6 HOURS AS NEEDED FOR WHEEZING OR SHORTNESS OF BREATH     Pulmonology:  Beta Agonists 2 Failed - 05/19/2023  8:29 AM      Failed - Last BP in normal range    BP Readings from Last 1 Encounters:  04/29/23 (!) 156/98         Passed - Last Heart Rate in normal range    Pulse Readings from Last 1 Encounters:  04/29/23 93         Passed - Valid encounter within last 12 months    Recent Outpatient Visits           2 weeks ago Bronchitis   [place] [facility] [place], Marzella Schlein, MD   2 months ago Generalized anxiety disorder   [place] [facility] [place], Marzella Schlein, MD   3 months ago COVID-19   [facility], Marzella Schlein, MD   4 months ago Generalized anxiety disorder   [place] [facility] [place], Marzella Schlein, MD   1 year ago Encounter for annual physical exam    [facility] [place], Marzella Schlein, MD       Future Appointments             In 2 weeks Gollan, Tollie Pizza, MD  [facility]   In 3 weeks Bacigalupo, Marzella Schlein, MD [facility]

## 2023-06-06 NOTE — Progress Notes (Unsigned)
Evaluation Performed:  Follow-up visit  Date:  06/07/2023   ID:  Betty Rodriguez, DOB January 27, 1971, MRN [id number]  Patient Location:  [address] [place] [address]   Provider location:   [facility], [place] office  PCP:  Erasmo Downer, MD  Cardiologist:  [facility]   Chief Complaint  Patient presents with   12 month follow up     "Doing well." Medications reviewed the patient verbally.     History of Present Illness:    Betty Rodriguez is a 52 y.o. female with history of SVT in 03/2017,  HTN,  anxiety  admission to [facility] from 5/23-5/24/18 for allergic reaction (after shot, benadryl) and palpations noted to have SVT Recurrent SVT August 2019 Who presents for follow-up of her arrhythmia/SVT   Seen by myself in clinic February 2023  Covid end of march 2024, on inhaler Treated with antivirals and prednisone Has made full recovery  Denies any recent tachycardia concerning for SVT Tolerating metoprolol succinate 12.5 daily  Supervisor, works at [employer] cholesterol elevated 230, LDL 140 Repeat check total cholesterol down to 190, more in line with her baseline over the past 5 years  CT abdomen pelvis  no aortic atherosclerosis, no iliac artery atherosclerosis  family history Grandfather died early in life, late 57s Father with CAD, stents, smoker  EKG personally reviewed by myself on todays visit EKG Interpretation Date/Time:  Monday June 07 2023 15:25:27 EDT Ventricular Rate:  84 PR Interval:  112 QRS Duration:  70 QT Interval:  386 QTC Calculation: 456 R Axis:   49  Text Interpretation: Normal sinus rhythm Normal ECG When compared with ECG of 21-Apr-2017 01:56, PREVIOUS ECG IS PRESENT Confirmed by Julien Nordmann ([id number]) on 06/07/2023 3:57:59 PM   Past medical history reviewed Seen in August 2019  second SVT episode  Got overheated,    episode of tachycardia November 2018 acute onset of  tachycardia rate 160 bpm,  was watching TV, ate pizza,, 2:30 p.m.  acute tachycardia Rate 165,  Took diltiazem metoprolol xl 25   Echocardiogram performed on 6/13 showed normal LV systolic function with EF of 55-60%, normal wall motion, normal LV diastolic function parameters.   Past Medical History:  Diagnosis Date   Allergy    Anxiety    Chicken pox    History of cold sores    Kidney stones    SVT (supraventricular tachycardia)    a. diagnosed 03/2017 in setting of hypokalemia   Vaginal Pap smear, abnormal    Past Surgical History:  Procedure Laterality Date   BREAST BIOPSY Right 07/06/2016   us/ core/neg-BENIGN BREAST TISSUE    TONSILLECTOMY  1983   US GUIDED CYST ASP BREAST R ([facility] HX) Right 12/09/2020   cyst asp     Allergies:   Amlodipine, Sertraline, and Sulfa antibiotics   Social History   Tobacco Use   Smoking status: Never   Smokeless tobacco: Never  Vaping Use   Vaping Use: Never used  Substance Use Topics   Alcohol use: Yes    Alcohol/week: 0.0 standard drinks of alcohol    Comment: Rare    Drug use: No     Current Outpatient Medications on File Prior to Visit  Medication Sig Dispense Refill   albuterol (VENTOLIN HFA) 108 (90 Base) MCG/ACT inhaler INHALE 2 PUFFS INTO THE LUNGS EVERY 6 HOURS AS NEEDED FOR WHEEZING OR SHORTNESS OF BREATH 8 g 1  DULoxetine HCl 40 MG CPEP Take 1 capsule (40 mg total) by mouth daily. 90 capsule 1   metoprolol succinate (TOPROL-XL) 25 MG 24 hr tablet Take 0.5 tablet (12.5 mg) by mouth once daily, you may take an extra 0.5 tablet (12.5 mg) once daily as needed for breakthrough tachycardia 60 tablet 1   propranolol (INDERAL) 10 MG tablet Take 1 tablet (10 mg total) by mouth 2 (two) times daily as needed. 30 tablet 1   valACYclovir (VALTREX) 1000 MG tablet TAKE 1 TABLET(1000 MG) BY MOUTH TWICE DAILY FOR 3 DAYS AS NEEDED 20 tablet 2   BLISOVI FE 1/20 1-20 MG-MCG tablet TAKE 1 TABLET BY MOUTH EVERY DAY CONTINUOUSLY (Patient not  taking: Reported on 06/07/2023) 112 tablet 0   No current facility-administered medications on file prior to visit.     Family Hx: The patient's family history includes Breast cancer in her paternal aunt; Diabetes in her maternal grandfather and maternal grandmother; Healthy in her mother; Heart disease in her maternal grandfather and maternal grandmother; Hyperlipidemia in her father; Hypertension in her father, maternal grandfather, and maternal grandmother; Multiple sclerosis in her paternal grandmother; Stroke (age of onset: 49) in her maternal grandmother. There is no history of Colon cancer or Ovarian cancer.  ROS:   Please see the history of present illness.    Review of Systems  Constitutional: Negative.   HENT: Negative.    Respiratory: Negative.    Cardiovascular: Negative.   Gastrointestinal: Negative.   Musculoskeletal: Negative.   Neurological: Negative.   Psychiatric/Behavioral: Negative.    All other systems reviewed and are negative.   Labs/Other Tests and Data Reviewed:    Recent Labs: No results found for requested labs within last 365 days.   Recent Lipid Panel Lab Results  Component Value Date/Time   CHOL 191 02/11/2022 11:29 AM   TRIG 114 02/11/2022 11:29 AM   HDL 57 02/11/2022 11:29 AM   CHOLHDL 3.4 02/11/2022 11:29 AM   LDLCALC 114 (H) 02/11/2022 11:29 AM    Wt Readings from Last 3 Encounters:  06/07/23 120 lb 2 oz (54.5 kg)  04/29/23 116 lb 4.8 oz (52.8 kg)  01/12/23 121 lb (54.9 kg)     Exam:    Vital Signs: Vital signs may also be detailed in the HPI BP 118/70 (BP Location: Left Arm, Patient Position: Sitting, Cuff Size: Normal)   Pulse 84   Ht 5\' 3"  (1.6 m)   Wt 120 lb 2 oz (54.5 kg)   SpO2 98%   BMI 21.28 kg/m   Constitutional:  oriented to person, place, and time. No distress.  HENT:  Head: Grossly normal Eyes:  no discharge. No scleral icterus.  Neck: No JVD, no carotid bruits  Cardiovascular: Regular rate and rhythm, no murmurs  appreciated Pulmonary/Chest: Clear to auscultation bilaterally, no wheezes or rails Abdominal: Soft.  no distension.  no tenderness.  Musculoskeletal: Normal range of motion Neurological:  normal muscle tone. Coordination normal. No atrophy Skin: Skin warm and dry Psychiatric: normal affect, pleasant   ASSESSMENT & PLAN:    Problem List Items Addressed This Visit       Cardiology Problems   Hypertension   Relevant Orders   EKG 12-Lead (Completed)   SVT (supraventricular tachycardia) - Primary   Relevant Orders   EKG 12-Lead (Completed)   Atrial tachycardia   Relevant Orders   EKG 12-Lead (Completed)     Other   Generalized anxiety disorder   Other Visit Diagnoses  SOB (shortness of breath)       Essential hypertension       Relevant Orders   EKG 12-Lead (Completed)   Mixed hyperlipidemia         Atrial tachycardia/SVT Continue metoprolol succinate 12.5 daily Propranolol 10 as needed For recurrent episodes of SVT as detailed back in 2018, consider taking propranolol, carotid sinus massage, Valsalva technique, if it does not break could call EMTs for adenosine  Hyperlipidemia No aortic atherosclerosis noted on CT scan 2021 Strong family history father, grandfather Cholesterol numbers improved on recheck down to 190    Total encounter time more than 30 minutes  Greater than 50% was spent in counseling and coordination of care with the patient    Signed, Julien Nordmann, MD  [facility] [address], [place], [place] [address]

## 2023-06-07 ENCOUNTER — Encounter: Payer: Self-pay | Admitting: Cardiovascular Disease

## 2023-06-07 ENCOUNTER — Ambulatory Visit: Payer: Managed Care, Other (non HMO) | Attending: Cardiovascular Disease | Admitting: Cardiovascular Disease

## 2023-06-07 VITALS — BP 118/70 | HR 84 | Ht 63.0 in | Wt 120.1 lb

## 2023-06-07 DIAGNOSIS — E782 Mixed hyperlipidemia: Secondary | ICD-10-CM

## 2023-06-07 DIAGNOSIS — I4719 Other supraventricular tachycardia: Secondary | ICD-10-CM

## 2023-06-07 DIAGNOSIS — I471 Supraventricular tachycardia, unspecified: Secondary | ICD-10-CM

## 2023-06-07 DIAGNOSIS — R0602 Shortness of breath: Secondary | ICD-10-CM | POA: Diagnosis not present

## 2023-06-07 DIAGNOSIS — I10 Essential (primary) hypertension: Secondary | ICD-10-CM | POA: Diagnosis not present

## 2023-06-07 DIAGNOSIS — F411 Generalized anxiety disorder: Secondary | ICD-10-CM

## 2023-06-07 MED ORDER — METOPROLOL SUCCINATE ER 25 MG PO TB24: ORAL_TABLET | ORAL | 3 refills | Status: DC

## 2023-06-07 NOTE — Patient Instructions (Signed)
Medication Instructions:  No changes  If you need a refill on your cardiac medications before your next appointment, please call your pharmacy.   Lab work: No new labs needed  Testing/Procedures: No new testing needed  Follow-Up: At [facility], you and your health needs are our priority.  As part of our continuing mission to provide you with exceptional heart care, we have created designated Provider Care Teams.  These Care Teams include your primary Cardiologist (physician) and Advanced Practice Providers (APPs -  Physician Assistants and Nurse Practitioners) who all work together to provide you with the care you need, when you need it.  You will need a follow up appointment as needed  Providers on your designated Care Team:   Christopher Berge, NP Ryan Dunn, PA-C Cadence Furth, PA-C  COVID-19 Vaccine Information can be found at: [email or web address] For questions related to vaccine distribution or appointments, please email [email or web address] or call [phone].    

## 2023-06-14 ENCOUNTER — Ambulatory Visit (INDEPENDENT_AMBULATORY_CARE_PROVIDER_SITE_OTHER): Payer: Managed Care, Other (non HMO) | Admitting: Family Medicine

## 2023-06-14 ENCOUNTER — Encounter: Payer: Self-pay | Admitting: Family Medicine

## 2023-06-14 VITALS — BP 119/79 | HR 80 | Temp 98.2°F | Resp 14 | Ht 63.0 in | Wt 118.4 lb

## 2023-06-14 DIAGNOSIS — I10 Essential (primary) hypertension: Secondary | ICD-10-CM

## 2023-06-14 DIAGNOSIS — Z9889 Other specified postprocedural states: Secondary | ICD-10-CM

## 2023-06-14 DIAGNOSIS — R739 Hyperglycemia, unspecified: Secondary | ICD-10-CM

## 2023-06-14 DIAGNOSIS — Z124 Encounter for screening for malignant neoplasm of cervix: Secondary | ICD-10-CM | POA: Diagnosis not present

## 2023-06-14 DIAGNOSIS — Z0000 Encounter for general adult medical examination without abnormal findings: Secondary | ICD-10-CM

## 2023-06-14 NOTE — Progress Notes (Signed)
Complete physical exam   Patient: Betty Rodriguez   DOB: Oct 09, 1971   52 y.o. Female  MRN: [id number] Visit Date: 06/14/2023  Today's healthcare provider: Shirlee Latch, MD   Chief Complaint  Patient presents with   Annual Exam   Subjective    Betty Rodriguez is a 52 y.o. female who presents today for a complete physical exam.  She reports consuming a general diet.  She generally feels well. She reports sleeping well. She does not have additional problems to discuss today.  HPI   Discussed the use of AI scribe software for clinical note transcription with the patient, who gave verbal consent to proceed.  History of Present Illness   The patient presents for an annual physical and wellness check. They report no new health concerns. The patient had an abnormal Pap smear in 2021, which was followed by a benign biopsy. They have not had a repeat Pap smear since the abnormal result. The patient is due for colon cancer screening in October and has already ordered a kit through [facility]. They had a mammogram in May and are up to date with this screening. The patient is also due for cholesterol labs, which have not been done in over a year. They report that propranolol is effective for public speaking anxiety.       Past Medical History:  Diagnosis Date   Allergy    Anxiety    Chicken pox    History of cold sores    Kidney stones    SVT (supraventricular tachycardia)    a. diagnosed 03/2017 in setting of hypokalemia   Vaginal Pap smear, abnormal    Past Surgical History:  Procedure Laterality Date   BREAST BIOPSY Right 07/06/2016   us/ core/neg-BENIGN BREAST TISSUE    TONSILLECTOMY  1983   US GUIDED CYST ASP BREAST R ([facility] HX) Right 12/09/2020   cyst asp   Social History   Socioeconomic History   Marital status: Divorced    Spouse name: Not on file   Number of children: 3   Years of education: Not on file   Highest education level: Not on file  Occupational  History   Occupation: Microbiologist  Tobacco Use   Smoking status: Never   Smokeless tobacco: Never  Vaping Use   Vaping status: Never Used  Substance and Sexual Activity   Alcohol use: Yes    Alcohol/week: 0.0 standard drinks of alcohol    Comment: Rare    Drug use: No   Sexual activity: Yes    Partners: Male    Birth control/protection: OCP    Comment: 1 partner   Other Topics Concern   Not on file  Social History Narrative   Employed at [employer] as a Psychologist, 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    Caffeine- soda diet 1 can, water, no coffee/tea   Works 3rd shift    2 sons and a daughter (25, 35, 57- daughter)       Social Determinants of Corporate investment banker Strain: Not on Ship broker Insecurity: Not on file  Transportation Needs: Not on file  Physical Activity: Not on file  Stress: Not on file  Social Connections: Not on file  Intimate Partner Violence: Not on file   Family Status  Relation Name Status   Mother  Alive   Father  Alive   Brother  Alive   MGM  (Not Specified)   MGF  (Not Specified)  PGM  (Not Specified)   Emelda Brothers  (Not Specified)   Neg Hx  (Not Specified)  No partnership data on file   Family History  Problem Relation Age of Onset   Healthy Mother    Hyperlipidemia Father    Hypertension Father    Diabetes Maternal Grandmother    Hypertension Maternal Grandmother    Heart disease Maternal Grandmother    Stroke Maternal Grandmother 74   Diabetes Maternal Grandfather    Hypertension Maternal Grandfather    Heart disease Maternal Grandfather    Multiple sclerosis Paternal Grandmother    Breast cancer Paternal Aunt    Colon cancer Neg Hx    Ovarian cancer Neg Hx    Allergies  Allergen Reactions   Amlodipine Hives and Shortness Of Breath   Sertraline Hives    SVT SVT   Sulfa Antibiotics Hives and Shortness Of Breath    Patient Care Team: Erasmo Downer, MD as PCP - General (Family Medicine)    Medications: Outpatient Medications Prior to Visit  Medication Sig   albuterol (VENTOLIN HFA) 108 (90 Base) MCG/ACT inhaler INHALE 2 PUFFS INTO THE LUNGS EVERY 6 HOURS AS NEEDED FOR WHEEZING OR SHORTNESS OF BREATH   DULoxetine HCl 40 MG CPEP Take 1 capsule (40 mg total) by mouth daily.   metoprolol succinate (TOPROL-XL) 25 MG 24 hr tablet Take 0.5 tablet (12.5 mg) by mouth once daily, you may take an extra 0.5 tablet (12.5 mg) once daily as needed for breakthrough tachycardia   propranolol (INDERAL) 10 MG tablet Take 1 tablet (10 mg total) by mouth 2 (two) times daily as needed.   valACYclovir (VALTREX) 1000 MG tablet TAKE 1 TABLET(1000 MG) BY MOUTH TWICE DAILY FOR 3 DAYS AS NEEDED   BLISOVI FE 1/20 1-20 MG-MCG tablet TAKE 1 TABLET BY MOUTH EVERY DAY CONTINUOUSLY (Patient not taking: Reported on 06/14/2023)   No facility-administered medications prior to visit.    Review of Systems per HPI      Objective    BP 119/79 (BP Location: Left Arm, Patient Position: Sitting, Cuff Size: Normal)   Pulse 80   Temp 98.2 F (36.8 C) (Oral)   Resp 14   Ht 5\' 3"  (1.6 m)   Wt 118 lb 6.4 oz (53.7 kg)   SpO2 99%   BMI 20.97 kg/m       Physical Exam Vitals reviewed.  Constitutional:      General: She is not in acute distress.    Appearance: Normal appearance. She is well-developed. She is not diaphoretic.  HENT:     Head: Normocephalic and atraumatic.     Right Ear: Tympanic membrane, ear canal and external ear normal.     Left Ear: Tympanic membrane, ear canal and external ear normal.     Nose: Nose normal.     Mouth/Throat:     Mouth: Mucous membranes are moist.     Pharynx: Oropharynx is clear. No oropharyngeal exudate.  Eyes:     General: No scleral icterus.    Conjunctiva/sclera: Conjunctivae normal.     Pupils: Pupils are equal, round, and reactive to light.  Neck:     Thyroid: No thyromegaly.  Cardiovascular:     Rate and Rhythm: Normal rate and regular rhythm.      Heart sounds: Normal heart sounds. No murmur heard. Pulmonary:     Effort: Pulmonary effort is normal. No respiratory distress.     Breath sounds: Normal breath sounds. No wheezing or rales.  Abdominal:  General: There is no distension.     Palpations: Abdomen is soft.     Tenderness: There is no abdominal tenderness.  Genitourinary:    Comments: GYN:  External genitalia within normal limits.  Vaginal mucosa pink, moist, normal rugae.  Nonfriable cervix without lesions, no discharge or bleeding noted on speculum exam.   Musculoskeletal:        General: No deformity.     Cervical back: Neck supple.     Right lower leg: No edema.     Left lower leg: No edema.  Lymphadenopathy:     Cervical: No cervical adenopathy.  Skin:    General: Skin is warm and dry.     Findings: No rash.  Neurological:     Mental Status: She is alert and oriented to person, place, and time. Mental status is at baseline.     Gait: Gait normal.  Psychiatric:        Mood and Affect: Mood normal.        Behavior: Behavior normal.        Thought Content: Thought content normal.       Last depression screening scores    06/14/2023    2:36 PM 04/29/2023    2:04 PM 02/25/2023    1:45 PM  PHQ 2/9 Scores  PHQ - 2 Score 0 0 0  PHQ- 9 Score   2   Last fall risk screening    04/29/2023    2:04 PM  Fall Risk   Falls in the past year? 0  Number falls in past yr: 0  Injury with Fall? 0  Risk for fall due to : No Fall Risks   Last Audit-C alcohol use screening    01/12/2023    2:29 PM  Alcohol Use Disorder Test (AUDIT)  1. How often do you have a drink containing alcohol? 1  2. How many drinks containing alcohol do you have on a typical day when you are drinking? 0  3. How often do you have six or more drinks on one occasion? 0  AUDIT-C Score 1   A score of 3 or more in women, and 4 or more in men indicates increased risk for alcohol abuse, EXCEPT if all of the points are from question 1   No results  found for any visits on 06/14/23.  Assessment & Plan    Routine Health Maintenance and Physical Exam  Exercise Activities and Dietary recommendations  Goals   None     Immunization History  Administered Date(s) Administered   Influenza,inj,Quad PF,6+ Mos 08/30/2019, 07/24/2020, 10/01/2021   Influenza-Unspecified 10/15/2017, 09/09/2018, 10/16/2022   PFIZER(Purple Top)SARS-COV-2 Vaccination 02/02/2020, 03/13/2020   Tdap 05/23/2018   Zoster Recombinant(Shingrix) 05/14/2022, 07/30/2022    Health Maintenance  Topic Date Due   COVID-19 Vaccine (3 - Pfizer risk series) 04/10/2020   INFLUENZA VACCINE  07/01/2023   PAP SMEAR-Modifier  09/05/2023   Fecal DNA (Cologuard)  09/28/2023   MAMMOGRAM  04/12/2025   DTaP/Tdap/Td (2 - Td or Tdap) 05/23/2028   Hepatitis C Screening  Completed   HIV Screening  Completed   Zoster Vaccines- Shingrix  Completed   HPV VACCINES  Aged Out   Colonoscopy  Discontinued    Discussed health benefits of physical activity, and encouraged her to engage in regular exercise appropriate for her age and condition.  Problem List Items Addressed This Visit       Cardiovascular and Mediastinum   Hypertension   Relevant Orders   Comprehensive  metabolic panel   Lipid panel     Other   History of colposcopy with cervical biopsy   Other Visit Diagnoses     Encounter for annual physical exam    -  Primary   Relevant Orders   Comprehensive metabolic panel   Lipid panel   Hemoglobin A1c   Cytology - PAP   Cervical cancer screening       Relevant Orders   Cytology - PAP   Hyperglycemia       Relevant Orders   Hemoglobin A1c          Abnormal Pap Smear: Previous abnormal Pap smear (LSIL, HPV negative) in 2021 with benign biopsy results. No follow-up Pap smear in 2022. -Perform Pap smear today with HPV testing.  Colon Cancer Screening: Last Cologuard test in 2021. Patient has ordered a free test from [facility]. -Advise patient to forward results to  the office via MyChart.  Breast Cancer Screening: Last mammogram in May 2024. -Continue annual mammograms.  Vaccinations: Up-to-date on tetanus. Flu shot and COVID booster available in the fall. -Advise patient to get flu shot and COVID booster in the fall.  Labs: Last labs over a year ago. -Order labs today.  Follow-up: Medications well-tolerated, no new issues. -Plan for annual follow-up unless issues arise.        Return in about 1 year (around 06/13/2024) for CPE.     I, Shirlee Latch, MD, have reviewed all documentation for this visit. The documentation on 06/14/23 for the exam, diagnosis, procedures, and orders are all accurate and complete.   Betty Rodriguez, Marzella Schlein, MD, MPH [facility]

## 2023-06-21 LAB — CYTOLOGY - PAP
Comment: NEGATIVE
Diagnosis: NEGATIVE
Diagnosis: REACTIVE
High risk HPV: NEGATIVE

## 2023-06-25 ENCOUNTER — Encounter: Payer: Self-pay | Admitting: Family Medicine

## 2023-07-14 ENCOUNTER — Other Ambulatory Visit: Payer: Self-pay | Admitting: Family Medicine

## 2023-07-14 DIAGNOSIS — N926 Irregular menstruation, unspecified: Secondary | ICD-10-CM

## 2023-09-29 ENCOUNTER — Other Ambulatory Visit: Payer: Self-pay | Admitting: Family Medicine

## 2023-09-29 ENCOUNTER — Ambulatory Visit (INDEPENDENT_AMBULATORY_CARE_PROVIDER_SITE_OTHER): Payer: Managed Care, Other (non HMO)

## 2023-09-29 DIAGNOSIS — N926 Irregular menstruation, unspecified: Secondary | ICD-10-CM

## 2023-09-29 DIAGNOSIS — Z23 Encounter for immunization: Secondary | ICD-10-CM | POA: Diagnosis not present

## 2023-09-29 NOTE — Progress Notes (Signed)
Patient tolerated vaccine well 

## 2023-09-30 MED ORDER — BLISOVI FE 1/20 1-20 MG-MCG PO TABS: ORAL_TABLET | ORAL | 0 refills | Status: DC

## 2023-10-21 ENCOUNTER — Ambulatory Visit: Payer: Self-pay | Admitting: *Deleted

## 2023-10-21 ENCOUNTER — Ambulatory Visit: Payer: Managed Care, Other (non HMO) | Admitting: Family Medicine

## 2023-10-21 NOTE — Telephone Encounter (Signed)
Chief Complaint: low abdominal pain / joint pain Symptoms: low left side abdominal pain comes and goes. None now. Sharp pain at times . Reports "eating clean for a while". Afraid to eat due to abdominal pain. C/o joint pain /body aches on and off. Affects sleep. Taking tylenol with no reports that effective for pain relief.  Frequency: less than a month greater than couple of weeks. Pertinent Negatives: Patient denies fever no severe pain , denies pain now  Disposition: [] ED /[] Urgent Care (no appt availability in office) / [x] Appointment(In office/virtual)/ []  [facility]/ [] Home Care/ [] Refused Recommended Disposition /[] [facility]/ []  Follow-up with PCP Additional Notes:   Appt with PCP canceled today. Patient would prefer to see PCP than other provider. Did schedule appt with Dr. Roxan Hockey for 11/03/23. Patient on wait list for Dr. B. Recommended if pain persists  or worsens go to UC or ED.  Patient reports if PCP has other suggestions can send message via My Chart.    Reason for Disposition  Abdominal pain is a chronic symptom (recurrent or ongoing AND present > 4 weeks)  Answer Assessment - Initial Assessment Questions 1. LOCATION: "Where does it hurt?"      Low left side abdominal pain  2. RADIATION: "Does the pain shoot anywhere else?" (e.g., chest, back)     Joint pains comes and goes  3. ONSET: "When did the pain begin?" (e.g., minutes, hours or days ago)      Approx more than 2 weeks joint pain  4. SUDDEN: "Gradual or sudden onset?"     Sudden with abdominal pain  5. PATTERN "Does the pain come and go, or is it constant?"    - If it comes and goes: "How long does it last?" "Do you have pain now?"     (Note: Comes and goes means the pain is intermittent. It goes away completely between bouts.)    - If constant: "Is it getting better, staying the same, or getting worse?"      (Note: Constant means the pain never goes away completely; most serious pain is  constant and gets worse.)      Comes and goes  6. SEVERITY: "How bad is the pain?"  (e.g., Scale 1-10; mild, moderate, or severe)    - MILD (1-3): Doesn't interfere with normal activities, abdomen soft and not tender to touch.     - MODERATE (4-7): Interferes with normal activities or awakens from sleep, abdomen tender to touch.     - SEVERE (8-10): Excruciating pain, doubled over, unable to do any normal activities.       Sharp pain low abdomen at times . Disrupted sleep with body aches and joint pain .  7. RECURRENT SYMPTOM: "Have you ever had this type of stomach pain before?" If Yes, ask: "When was the last time?" and "What happened that time?"      na 8. CAUSE: "What do you think is causing the stomach pain?"     Not sure  9. RELIEVING/AGGRAVATING FACTORS: "What makes it better or worse?" (e.g., antacids, bending or twisting motion, bowel movement)     Tylenol  10. OTHER SYMPTOMS: "Do you have any other symptoms?" (e.g., back pain, diarrhea, fever, urination pain, vomiting)       Body aches x 1 week. Low left abdominal pain joints stiff  11. PREGNANCY: "Is there any chance you are pregnant?" "When was your last menstrual period?"       na  Protocols used:  Abdominal Pain - Female-A-AH

## 2023-11-02 ENCOUNTER — Ambulatory Visit: Payer: Managed Care, Other (non HMO) | Admitting: Family Medicine

## 2023-11-02 NOTE — Progress Notes (Unsigned)
      Established patient visit   Patient: Betty Rodriguez   DOB: [birth year]   52 y.o. Female  MRN: [id number] Visit Date: 11/03/2023  Today's healthcare provider: Ronnald Ramp, MD   No chief complaint on file.  Subjective       Discussed the use of AI scribe software for clinical note transcription with the patient, who gave verbal consent to proceed.  History of Present Illness             Past Medical History:  Diagnosis Date   Allergy    Anxiety    Chicken pox    History of cold sores    Kidney stones    SVT (supraventricular tachycardia)    a. diagnosed 03/2017 in setting of hypokalemia   Vaginal Pap smear, abnormal     Medications: Outpatient Medications Prior to Visit  Medication Sig   albuterol (VENTOLIN HFA) 108 (90 Base) MCG/ACT inhaler INHALE 2 PUFFS INTO THE LUNGS EVERY 6 HOURS AS NEEDED FOR WHEEZING OR SHORTNESS OF BREATH   BLISOVI FE 1/20 1-20 MG-MCG tablet TAKE 1 TABLET BY MOUTH EVERY DAY CONTINUOUSLY   DULoxetine HCl 40 MG CPEP Take 1 capsule (40 mg total) by mouth daily.   metoprolol succinate (TOPROL-XL) 25 MG 24 hr tablet Take 0.5 tablet (12.5 mg) by mouth once daily, you may take an extra 0.5 tablet (12.5 mg) once daily as needed for breakthrough tachycardia   propranolol (INDERAL) 10 MG tablet Take 1 tablet (10 mg total) by mouth 2 (two) times daily as needed.   valACYclovir (VALTREX) 1000 MG tablet TAKE 1 TABLET(1000 MG) BY MOUTH TWICE DAILY FOR 3 DAYS AS NEEDED   No facility-administered medications prior to visit.    Review of Systems  {Insert previous labs (optional):23779} {See past labs  Heme  Chem  Endocrine  Serology  Results Review (optional):1}   Objective    There were no vitals taken for this visit. {Insert last BP/Wt (optional):23777}{See vitals history (optional):1}    Physical Exam  ***  No results found for any visits on 11/03/23.  Assessment & Plan     Problem List Items Addressed This Visit    None   Assessment and Plan              No follow-ups on file.         Ronnald Ramp, MD  [facility] [phone] (phone) [phone] (fax)  [facility]

## 2023-11-03 ENCOUNTER — Ambulatory Visit: Payer: Managed Care, Other (non HMO) | Admitting: Family Medicine

## 2023-11-03 ENCOUNTER — Encounter: Payer: Self-pay | Admitting: Family Medicine

## 2023-11-03 VITALS — BP 134/90 | HR 80 | Resp 16 | Ht 63.0 in | Wt 116.5 lb

## 2023-11-03 DIAGNOSIS — M25541 Pain in joints of right hand: Secondary | ICD-10-CM

## 2023-11-03 DIAGNOSIS — M25512 Pain in left shoulder: Secondary | ICD-10-CM

## 2023-11-03 DIAGNOSIS — M25511 Pain in right shoulder: Secondary | ICD-10-CM | POA: Diagnosis not present

## 2023-11-03 DIAGNOSIS — R1084 Generalized abdominal pain: Secondary | ICD-10-CM | POA: Diagnosis not present

## 2023-11-03 DIAGNOSIS — M791 Myalgia, unspecified site: Secondary | ICD-10-CM

## 2023-11-03 DIAGNOSIS — M25542 Pain in joints of left hand: Secondary | ICD-10-CM

## 2023-11-05 ENCOUNTER — Telehealth: Payer: Self-pay | Admitting: Family Medicine

## 2023-11-05 LAB — CYCLIC CITRUL PEPTIDE ANTIBODY, IGG/IGA: Cyclic Citrullin Peptide Ab: 30 U — ABNORMAL HIGH (ref 0–19)

## 2023-11-05 LAB — CBC
Hematocrit: 37.1 % (ref 34.0–46.6)
Hemoglobin: 12.3 g/dL (ref 11.1–15.9)
MCH: 29.9 pg (ref 26.6–33.0)
MCHC: 33.2 g/dL (ref 31.5–35.7)
MCV: 90 fL (ref 79–97)
Platelets: 284 10*3/uL (ref 150–450)
RBC: 4.11 x10E6/uL (ref 3.77–5.28)
RDW: 12.6 % (ref 11.7–15.4)
WBC: 5.1 10*3/uL (ref 3.4–10.8)

## 2023-11-05 LAB — CMP14+EGFR
ALT: 22 [IU]/L (ref 0–32)
AST: 28 [IU]/L (ref 0–40)
Albumin: 4.8 g/dL (ref 3.8–4.9)
Alkaline Phosphatase: 58 [IU]/L (ref 44–121)
BUN/Creatinine Ratio: 14 (ref 9–23)
BUN: 11 mg/dL (ref 6–24)
Bilirubin Total: 0.7 mg/dL (ref 0.0–1.2)
CO2: 26 mmol/L (ref 20–29)
Calcium: 10.1 mg/dL (ref 8.7–10.2)
Chloride: 101 mmol/L (ref 96–106)
Creatinine, Ser: 0.8 mg/dL (ref 0.57–1.00)
Globulin, Total: 2.2 g/dL (ref 1.5–4.5)
Glucose: 103 mg/dL — ABNORMAL HIGH (ref 70–99)
Potassium: 4.3 mmol/L (ref 3.5–5.2)
Sodium: 141 mmol/L (ref 134–144)
Total Protein: 7 g/dL (ref 6.0–8.5)
eGFR: 89 mL/min/{1.73_m2} (ref >59)

## 2023-11-05 LAB — RHEUMATOID FACTOR: Rheumatoid fact SerPl-aCnc: 10.5 [IU]/mL (ref <14.0)

## 2023-11-05 LAB — TSH+T4F+T3FREE
Free T4: 1.17 ng/dL (ref 0.82–1.77)
T3, Free: 3.4 pg/mL (ref 2.0–4.4)
TSH: 2.11 u[IU]/mL (ref 0.450–4.500)

## 2023-11-05 LAB — HEPB+HEPC+HIV PANEL
HIV Screen 4th Generation wRfx: NONREACTIVE
Hep B C IgM: NEGATIVE
Hep B Core Total Ab: NEGATIVE
Hep B E Ab: NONREACTIVE
Hep B E Ag: NEGATIVE
Hep B Surface Ab, Qual: UNDETERMINED
Hep C Virus Ab: NONREACTIVE
Hepatitis B Surface Ag: NEGATIVE

## 2023-11-05 LAB — CK: Total CK: 112 U/L (ref 32–182)

## 2023-11-05 LAB — LIPASE: Lipase: 56 U/L (ref 14–72)

## 2023-11-05 LAB — PARVOVIRUS B19 ANTIBODY, IGG AND IGM
Parvovirus B19 IgG: 0.3 {index} (ref 0.0–0.8)
Parvovirus B19 IgM: 0.2 {index} (ref 0.0–0.8)

## 2023-11-05 LAB — HEMOGLOBIN A1C
Est. average glucose Bld gHb Est-mCnc: 126 mg/dL
Hgb A1c MFr Bld: 6 % — ABNORMAL HIGH (ref 4.8–5.6)

## 2023-11-05 LAB — ANA W/REFLEX IF POSITIVE: Anti Nuclear Antibody (ANA): NEGATIVE

## 2023-11-05 LAB — C-REACTIVE PROTEIN: CRP: <1 mg/L (ref 0–10)

## 2023-11-05 NOTE — Addendum Note (Signed)
Addended by: Bing Neighbors on: 11/05/2023 10:33 AM   Modules accepted: Orders

## 2023-11-05 NOTE — Progress Notes (Signed)
Antibodies for rheumatoid arthritis were weakly positive. I have placed a referral for rheumatology for further evaluation and discussion of treatment options if diagnosis is confirmed.   Other labs were within normal limits and discussed in detail below

## 2023-11-05 NOTE — Telephone Encounter (Signed)
Patient would like to see a rheumatology provider she prefers names Marily Memos MD [phone] [facility] Can you assist with this and reach out to the patient if any further questions

## 2023-11-08 NOTE — Telephone Encounter (Signed)
Pt is calling to report that Marily Memos MD. Is needing a fax to be sent for the referral. [phone] Scheduling into April 2026 Requesting a note to expedite pt to be seen.

## 2023-11-08 NOTE — Telephone Encounter (Signed)
Please review Maralyn Sago. Thanks.

## 2023-11-18 ENCOUNTER — Telehealth: Payer: Self-pay

## 2023-11-18 NOTE — Telephone Encounter (Signed)
Copied from CRM [id number]. Topic: General - Other >> Nov 18, 2023 11:21 AM Turkey B wrote: Reason for CRM: pt called in about form that was sent from rheumatology referral. She states they haven't received this and need it to make her appt. Please cb to discuss

## 2023-11-27 ENCOUNTER — Encounter: Payer: Self-pay | Admitting: Family Medicine

## 2023-11-27 DIAGNOSIS — B009 Herpesviral infection, unspecified: Secondary | ICD-10-CM

## 2023-11-29 MED ORDER — VALACYCLOVIR HCL 1 G PO TABS: 1000.0000 mg | ORAL_TABLET | Freq: Every day | ORAL | 2 refills | Status: DC

## 2023-11-29 NOTE — Telephone Encounter (Signed)
Pt called saying the form needs to be faxed back to rheumatology before they will schedule an appt,

## 2023-12-06 ENCOUNTER — Encounter: Payer: Self-pay | Admitting: Family Medicine

## 2023-12-06 ENCOUNTER — Ambulatory Visit: Payer: Managed Care, Other (non HMO) | Admitting: Family Medicine

## 2023-12-06 VITALS — BP 128/74 | HR 86 | Temp 97.9°F | Ht 63.0 in | Wt 115.0 lb

## 2023-12-06 DIAGNOSIS — M2560 Stiffness of unspecified joint, not elsewhere classified: Secondary | ICD-10-CM | POA: Diagnosis not present

## 2023-12-06 DIAGNOSIS — M2550 Pain in unspecified joint: Secondary | ICD-10-CM | POA: Diagnosis not present

## 2023-12-06 DIAGNOSIS — R768 Other specified abnormal immunological findings in serum: Secondary | ICD-10-CM

## 2023-12-06 NOTE — Progress Notes (Signed)
 Established patient visit   Patient: Betty Rodriguez   DOB: 1971-11-18   53 y.o. Female  MRN: [id number] Visit Date: 12/06/2023  Today's healthcare provider: Jon Eva, MD   Chief Complaint  Patient presents with   Arthralgias    Patient was last seen 3 weeks ago. She has inquired about referral to [facility]. She said that [facility] has a form that needs to be filled out and they faxed it over on 11/08/23.   She reports her pain is the same.  Her upper body joints hurt more than lower but lower is painful as well.    Subjective    HPI HPI     Arthralgias    Additional comments: Patient was last seen 3 weeks ago. She has inquired about referral to [facility]. She said that [facility] has a form that needs to be filled out and they faxed it over on 11/08/23.   She reports her pain is the same.  Her upper body joints hurt more than lower but lower is painful as well.       Last edited by Desanto, Elena D, CMA on 12/06/2023 11:04 AM.       Discussed the use of AI scribe software for clinical note transcription with the patient, who gave verbal consent to proceed.  History of Present Illness   The patient, with a history of multiple joint pain and stiffness, presents for a follow-up visit. The patient reports worsening pain in multiple joints, particularly in the elbows, shoulders, hands, and hips. The pain is associated with significant stiffness that lasts for more than 30 minutes after waking up or after periods of inactivity. The patient also reports fatigue and has been self-medicating with ibuprofen  and Tylenol . The patient has a weakly positive rheumatoid factor but other labs are negative. The patient also reports low blood pressure and has stopped taking metoprolol  due to this. The patient is seeking a referral to rheumatology for further evaluation and management.         Medications: Outpatient Medications Prior to Visit  Medication Sig   BLISOVI  FE 1/20 1-20 MG-MCG tablet TAKE  1 TABLET BY MOUTH EVERY DAY CONTINUOUSLY   metoprolol  succinate (TOPROL -XL) 25 MG 24 hr tablet Take 0.5 tablet (12.5 mg) by mouth once daily, you may take an extra 0.5 tablet (12.5 mg) once daily as needed for breakthrough tachycardia   Multiple Vitamin (MULTIVITAMIN) capsule Take 1 capsule by mouth daily.   propranolol  (INDERAL ) 10 MG tablet Take 1 tablet (10 mg total) by mouth 2 (two) times daily as needed.   valACYclovir  (VALTREX ) 1000 MG tablet Take 1 tablet (1,000 mg total) by mouth daily. For three days as needed.   [DISCONTINUED] albuterol  (VENTOLIN  HFA) 108 (90 Base) MCG/ACT inhaler INHALE 2 PUFFS INTO THE LUNGS EVERY 6 HOURS AS NEEDED FOR WHEEZING OR SHORTNESS OF BREATH   [DISCONTINUED] DULoxetine  HCl 40 MG CPEP Take 1 capsule (40 mg total) by mouth daily. (Patient not taking: Reported on 11/03/2023)   No facility-administered medications prior to visit.    Review of Systems     Objective    BP 128/74 (BP Location: Left Arm, Patient Position: Sitting, Cuff Size: Normal)   Pulse 86   Temp 97.9 F (36.6 C) (Oral)   Ht 5' 3 (1.6 m)   Wt 115 lb (52.2 kg)   SpO2 99%   BMI 20.37 kg/m    Physical Exam Vitals reviewed.  Constitutional:      General:  She is not in acute distress.    Appearance: Normal appearance. She is well-developed. She is not diaphoretic.  HENT:     Head: Normocephalic and atraumatic.  Eyes:     General: No scleral icterus.    Conjunctiva/sclera: Conjunctivae normal.  Neck:     Thyroid : No thyromegaly.  Cardiovascular:     Rate and Rhythm: Normal rate and regular rhythm.     Heart sounds: Normal heart sounds. No murmur heard. Pulmonary:     Effort: Pulmonary effort is normal. No respiratory distress.     Breath sounds: Normal breath sounds. No wheezing, rhonchi or rales.  Musculoskeletal:        General: Tenderness present. No swelling.     Cervical back: Neck supple.     Right lower leg: No edema.     Left lower leg: No edema.     Comments:  Normal ROM, +stiffness  Lymphadenopathy:     Cervical: No cervical adenopathy.  Skin:    General: Skin is warm and dry.     Findings: No rash.  Neurological:     Mental Status: She is alert and oriented to person, place, and time. Mental status is at baseline.  Psychiatric:        Mood and Affect: Mood normal.        Behavior: Behavior normal.      No results found for any visits on 12/06/23.  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 Symptoms are worse in the morning and improve with movement. Rheumatoid factor weakly positive, other labs negative. Differential includes rheumatoid arthritis, fibromyalgia, post-viral arthritis, and osteoarthritis. Discussed the importance of rheumatology evaluation. Ibuprofen  800 mg every 6 hours recommended; alternatives like meloxicam , naproxen, and Celebrex not chosen due to adverse effects and patient preference. - Complete and send referral to rheumatology - Continue ibuprofen  800 mg every 6 hours as needed - Continue Tylenol  1000 mg every 6 hours as needed  Hypotension Episodes of hypotension (90/60 mmHg) with lightheadedness and weakness. Metoprolol  held due to low readings. Advised to monitor hydration status and increase fluid intake to at least 40 ounces per day, especially when taking NSAIDs. - Hold metoprolol  - Increase fluid intake to at least 40 ounces per day  General Health Maintenance Discussion about pneumococcal vaccine for individuals 50 and older. Insurance coverage may not yet align with new recommendations. Explained that the vaccine is generally well-tolerated with minimal side effects and provides good protection. - Check with  pharmacy regarding insurance coverage for pneumococcal vaccine  Follow-up - Keep physical appointment in July.          Return if symptoms worsen or fail to improve.       Jon Eva, MD  [facility] [phone] (phone) [phone] (fax)  [facility]

## 2023-12-20 ENCOUNTER — Encounter: Payer: Self-pay | Admitting: Family Medicine

## 2023-12-20 DIAGNOSIS — M25512 Pain in left shoulder: Secondary | ICD-10-CM

## 2023-12-21 NOTE — Telephone Encounter (Signed)
Ok to order XRay of L shoulder for L shoulder pain at [facility]

## 2023-12-22 ENCOUNTER — Ambulatory Visit
Admission: RE | Admit: 2023-12-22 | Discharge: 2023-12-22 | Disposition: A | Discharge status: Home / Self Care | Payer: Managed Care, Other (non HMO) | Attending: Family Medicine | Admitting: Family Medicine

## 2023-12-22 ENCOUNTER — Ambulatory Visit
Admission: RE | Admit: 2023-12-22 | Discharge: 2023-12-22 | Disposition: A | Discharge status: Home / Self Care | Payer: Managed Care, Other (non HMO) | Source: Ambulatory Visit | Attending: Family Medicine

## 2023-12-22 DIAGNOSIS — M25512 Pain in left shoulder: Secondary | ICD-10-CM | POA: Diagnosis present

## 2023-12-23 ENCOUNTER — Encounter: Payer: Self-pay | Admitting: Family Medicine

## 2023-12-24 NOTE — Telephone Encounter (Signed)
Ok to offer referral to Ortho for further eval. They would determine if MRI is appropriate next step.

## 2024-03-13 ENCOUNTER — Encounter: Payer: Self-pay | Admitting: Cardiovascular Disease

## 2024-04-05 ENCOUNTER — Ambulatory Visit: Payer: Self-pay

## 2024-04-05 ENCOUNTER — Ambulatory Visit
Admission: RE | Admit: 2024-04-05 | Discharge: 2024-04-05 | Disposition: A | Discharge status: Home / Self Care | Source: Ambulatory Visit | Attending: Emergency Medicine | Admitting: Emergency Medicine

## 2024-04-05 VITALS — BP 132/80 | HR 81 | Temp 98.0°F | Resp 16

## 2024-04-05 DIAGNOSIS — J069 Acute upper respiratory infection, unspecified: Secondary | ICD-10-CM

## 2024-04-05 DIAGNOSIS — J029 Acute pharyngitis, unspecified: Secondary | ICD-10-CM

## 2024-04-05 LAB — POC SARS CORONAVIRUS 2 AG -  ED: SARS Coronavirus 2 Ag: NEGATIVE

## 2024-04-05 LAB — POCT RAPID STREP A (OFFICE): Rapid Strep A Screen: NEGATIVE

## 2024-04-05 NOTE — ED Triage Notes (Signed)
 Patient to Urgent Care with complaints of sore throat/ headaches/ fatigue. Denies any fevers.   Symptoms x4 days.  Meds: tylenol / cough drops.

## 2024-04-05 NOTE — Discharge Instructions (Addendum)
 The strep and COVID tests are negative.    Take Tylenol  or ibuprofen  as needed for fever or discomfort.    Follow-up with your primary care provider if your symptoms are not improving.

## 2024-04-05 NOTE — ED Provider Notes (Signed)
 Betty Rodriguez    CSN: [id number] Arrival date & time: 04/05/24  1558      History   Chief Complaint Chief Complaint  Patient presents with   Sore Throat    Entered by patient   Headache    HPI Betty Rodriguez is a 53 y.o. female.  Patient presents with 4-day history of fatigue, headache, sore throat.  She has been treating her symptoms with Tylenol .  She denies fever, chills, cough, shortness of breath, vomiting, diarrhea.    The history is provided by the patient and medical records.    Past Medical History:  Diagnosis Date   Allergy    Anxiety    Chicken pox    History of cold sores    Kidney stones    SVT (supraventricular tachycardia) (HCC)    a. 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 Authorizing Provider  BLISOVI  FE 1/20 1-20 MG-MCG tablet TAKE 1 TABLET BY MOUTH EVERY DAY  CONTINUOUSLY 09/30/23   Bacigalupo, Angela M, MD  metoprolol  succinate (TOPROL -XL) 25 MG 24 hr tablet Take 0.5 tablet (12.5 mg) by mouth once daily, you may take an extra 0.5 tablet (12.5 mg) once daily as needed for breakthrough tachycardia 06/07/23   Gollan, Timothy J, MD  Multiple Vitamin (MULTIVITAMIN) capsule Take 1 capsule by mouth daily.    [provider]  propranolol  (INDERAL ) 10 MG tablet Take 1 tablet (10 mg total) by mouth 2 (two) times daily as needed. 01/12/23   Mazie Speed, MD  valACYclovir  (VALTREX ) 1000 MG tablet Take 1 tablet (1,000 mg total) by mouth daily. For three days as needed. 11/29/23   Mazie Speed, MD    Family History Family History  Problem Relation Age of Onset   Healthy Mother    Hyperlipidemia Father    Hypertension Father    Diabetes Maternal Grandmother    Hypertension Maternal Grandmother    Heart disease Maternal Grandmother    Stroke Maternal Grandmother 53   Diabetes Maternal Grandfather    Hypertension Maternal Grandfather    Heart disease Maternal Grandfather  Multiple sclerosis Paternal Grandmother    Breast cancer Paternal Aunt    Colon cancer Neg Hx    Ovarian cancer Neg Hx     Social History Social History   Tobacco Use   Smoking status: Never   Smokeless tobacco: Never  Vaping Use   Vaping status: Never Used  Substance Use Topics   Alcohol use: Yes    Alcohol/week: 0.0 standard drinks of alcohol    Comment: Rare    Drug use: No     Allergies   Amlodipine , Sertraline , and Sulfa antibiotics   Review of Systems Review of Systems  Constitutional:  Positive for fatigue. Negative for chills and fever.  HENT:  Positive for sore throat. Negative for ear pain.   Respiratory:  Negative for cough and shortness of breath.   Gastrointestinal:  Negative for diarrhea and vomiting.  Neurological:  Positive for headaches.     Physical Exam Triage Vital Signs ED Triage Vitals  Encounter Vitals Group      BP 04/05/24 1601 132/80     Systolic BP Percentile --      Diastolic BP Percentile --      Pulse Rate 04/05/24 1601 81     Resp 04/05/24 1601 16     Temp 04/05/24 1601 98 F (36.7 C)     Temp src --      SpO2 04/05/24 1601 98 %     Weight --      Height --      Head Circumference --      Peak Flow --      Pain Score 04/05/24 1600 5     Pain Loc --      Pain Education --      Exclude from Growth Chart --    No data found.  Updated Vital Signs BP 132/80   Pulse 81   Temp 98 F (36.7 C)   Resp 16   LMP  (LMP Unknown)   SpO2 98%   Visual Acuity Right Eye Distance:   Left Eye Distance:   Bilateral Distance:    Right Eye Near:   Left Eye Near:    Bilateral Near:     Physical Exam Constitutional:      General: She is not in acute distress. HENT:     Right Ear: Tympanic membrane normal.     Left Ear: Tympanic membrane normal.     Nose: Nose normal.     Mouth/Throat:     Mouth: Mucous membranes are moist.     Pharynx: Posterior oropharyngeal erythema present.     Comments: Clear PND Cardiovascular:     Rate and Rhythm: Normal rate and regular rhythm.     Heart sounds: Normal heart sounds.  Pulmonary:     Effort: Pulmonary effort is normal. No respiratory distress.     Breath sounds: Normal breath sounds.  Neurological:     Mental Status: She is alert.      UC Treatments / Results  Labs (all labs ordered are listed, but only abnormal results are displayed) Labs Reviewed  POC SARS CORONAVIRUS 2 AG -  ED  POCT RAPID STREP A (OFFICE)    EKG   Radiology No results found.  Procedures Procedures (including critical care time)  Medications Ordered in UC Medications - No data to display  Initial Impression / Assessment and Plan / UC Course  I have reviewed the triage vital signs and the nursing notes.  Pertinent labs & imaging results  that were available during my care of the patient were reviewed by me and considered in my medical decision making  (see chart for details).    Viral pharyngitis, viral URI.  Afebrile and vital signs are stable.  Rapid strep negative.  COVID-negative.  Discussed symptomatic treatment including Tylenol  or ibuprofen  as needed.  Instructed patient to follow-up with her PCP if she is not improving.  She agrees to plan of care.  Final Clinical Impressions(s) / UC Diagnoses   Final diagnoses:  Viral pharyngitis  Viral URI     Discharge Instructions      The strep and COVID tests are negative.    Take Tylenol  or ibuprofen  as needed for fever or discomfort.    Follow-up with your primary care provider if your symptoms are not improving.         ED Prescriptions   None    PDMP not reviewed this encounter.   Wellington Half, NP 04/05/24 [phone]

## 2024-04-12 ENCOUNTER — Other Ambulatory Visit: Payer: Self-pay | Admitting: Family Medicine

## 2024-04-12 DIAGNOSIS — Z1231 Encounter for screening mammogram for malignant neoplasm of breast: Secondary | ICD-10-CM

## 2024-04-21 ENCOUNTER — Encounter

## 2024-05-22 ENCOUNTER — Ambulatory Visit
Admission: RE | Admit: 2024-05-22 | Discharge: 2024-05-22 | Disposition: A | Discharge status: Home / Self Care | Source: Ambulatory Visit | Attending: Family Medicine | Admitting: Family Medicine

## 2024-05-22 DIAGNOSIS — Z1231 Encounter for screening mammogram for malignant neoplasm of breast: Secondary | ICD-10-CM | POA: Insufficient documentation

## 2024-05-25 ENCOUNTER — Encounter: Payer: Self-pay | Admitting: Family Medicine

## 2024-05-29 ENCOUNTER — Ambulatory Visit: Payer: Self-pay | Admitting: Family Medicine

## 2024-05-29 NOTE — Progress Notes (Unsigned)
 Evaluation Performed:  Follow-up visit  Date:  05/30/2024   ID:  Betty Rodriguez, DOB 1971-02-06, MRN [id number]  Patient Location:  [address] [place] [address]   Provider location:   [facility], [place] office  PCP:  Myrla Jon HERO, MD  Cardiologist:  Perla MOCCASIN [facility]   Chief Complaint  Patient presents with   12 month follow up     Doing well.     History of Present Illness:    Betty Rodriguez is a 53 y.o. female with history of SVT in 03/2017,  HTN,  anxiety  admission to [facility] from 5/23-5/24/18 for allergic reaction (after shot, benadryl) and palpations noted to have SVT Recurrent SVT August 2019 Who presents for follow-up of her arrhythmia/SVT   Seen by myself in clinic 7/24 In follow-up she reports doing well Rare episodes of SVT Continues to take metoprolol  succinate 12.5 daily Rarely takes propranolol  10 mg for breakthrough tachycardia arrhythmia  Blood pressure running low, denies significant orthostasis symptoms  Reports having some cramping in her feet and legs at nighttime  History of COVID 2024 Continues to work at [employer]  Prior CT abdomen pelvis  no aortic atherosclerosis, no iliac artery atherosclerosis  family history Grandfather died early in life, late 60s Father with CAD, stents, smoker  EKG personally reviewed by myself on todays visit EKG Interpretation Date/Time:  Tuesday May 30 2024 16:38:45 EDT Ventricular Rate:  76 PR Interval:  94 QRS Duration:  82 QT Interval:  400 QTC Calculation: 450 R Axis:   63  Text Interpretation: Sinus rhythm with short PR When compared with ECG of 30-May-2024 16:37, No significant change was found Confirmed by Perla Lye [id number]) on 05/30/2024 4:45:18 PM   Past medical history reviewed Seen in August 2019  second SVT episode  Got overheated,    episode of tachycardia November 2018 acute onset of tachycardia rate 160 bpm,  was watching TV, ate  pizza,, 2:30 p.m.  acute tachycardia Rate 165,  Took diltiazem  metoprolol  xl 25   Echocardiogram performed on 6/13 showed normal LV systolic function with EF of 55-60%, normal wall motion, normal LV diastolic function parameters.   Past Medical History:  Diagnosis Date   Allergy    Anxiety    Chicken pox    History of cold sores    Kidney stones    SVT (supraventricular tachycardia) (HCC)    a. diagnosed 03/2017 in setting of hypokalemia   Vaginal Pap smear, abnormal    Past Surgical History:  Procedure Laterality Date   BREAST BIOPSY Right 07/06/2016   us / core/neg-BENIGN BREAST TISSUE    TONSILLECTOMY  1983   US  GUIDED CYST ASP BREAST R ([facility] HX) Right 12/09/2020   cyst asp     Allergies:   Amlodipine , Sertraline , and Sulfa antibiotics   Social History   Tobacco Use   Smoking status: Never   Smokeless tobacco: Never  Vaping Use   Vaping status: Never Used  Substance Use Topics   Alcohol use: Yes    Alcohol/week: 0.0 standard drinks of alcohol    Comment: Rare    Drug use: No     Current Outpatient Medications on File Prior to Visit  Medication Sig Dispense Refill   metoprolol  succinate (TOPROL -XL) 25 MG 24 hr tablet Take 0.5 tablet (12.5 mg) by mouth once daily, you may take an extra 0.5 tablet (12.5 mg) once daily as needed for breakthrough tachycardia 90 tablet 3  Multiple Vitamin (MULTIVITAMIN) capsule Take 1 capsule by mouth daily.     propranolol  (INDERAL ) 10 MG tablet Take 1 tablet (10 mg total) by mouth 2 (two) times daily as needed. 30 tablet 1   tretinoin (RETIN-A) 0.05 % cream Apply topically at bedtime.     valACYclovir  (VALTREX ) 1000 MG tablet Take 1 tablet (1,000 mg total) by mouth daily. For three days as needed. 20 tablet 2   No current facility-administered medications on file prior to visit.     Family Hx: The patient's family history includes Breast cancer in her paternal aunt; Diabetes in her maternal grandfather and maternal grandmother;  Healthy in her mother; Heart disease in her maternal grandfather and maternal grandmother; Hyperlipidemia in her father; Hypertension in her father, maternal grandfather, and maternal grandmother; Multiple sclerosis in her paternal grandmother; Stroke (age of onset: 27) in her maternal grandmother. There is no history of Colon cancer or Ovarian cancer.  ROS:   Please see the history of present illness.    Review of Systems  Constitutional: Negative.   HENT: Negative.    Respiratory: Negative.    Cardiovascular: Negative.   Gastrointestinal: Negative.   Musculoskeletal: Negative.   Neurological: Negative.   Psychiatric/Behavioral: Negative.    All other systems reviewed and are negative.   Labs/Other Tests and Data Reviewed:    Recent Labs: 11/03/2023: ALT 22; BUN 11; Creatinine, Ser 0.80; Hemoglobin 12.3; Platelets 284; Potassium 4.3; Sodium 141; TSH 2.110   Recent Lipid Panel Lab Results  Component Value Date/Time   CHOL 191 02/11/2022 11:29 AM   TRIG 114 02/11/2022 11:29 AM   HDL 57 02/11/2022 11:29 AM   CHOLHDL 3.4 02/11/2022 11:29 AM   LDLCALC 114 (H) 02/11/2022 11:29 AM    Wt Readings from Last 3 Encounters:  05/30/24 103 lb 8 oz (46.9 kg)  12/06/23 115 lb (52.2 kg)  11/03/23 116 lb 8 oz (52.8 kg)     Exam:    Vital Signs: Vital signs may also be detailed in the HPI BP 100/60 (BP Location: Left Arm, Patient Position: Sitting, Cuff Size: Normal)   Pulse 76   Ht 5' 3 (1.6 m)   Wt 103 lb 8 oz (46.9 kg)   LMP  (LMP Unknown)   SpO2 98%   BMI 18.33 kg/m   Constitutional:  oriented to person, place, and time. No distress.  HENT:  Head: Grossly normal Eyes:  no discharge. No scleral icterus.  Neck: No JVD, no carotid bruits  Cardiovascular: Regular rate and rhythm, no murmurs appreciated Pulmonary/Chest: Clear to auscultation bilaterally, no wheezes or rails Abdominal: Soft.  no distension.  no tenderness.  Musculoskeletal: Normal range of motion Neurological:   normal muscle tone. Coordination normal. No atrophy Skin: Skin warm and dry Psychiatric: normal affect, pleasant   ASSESSMENT & PLAN:    Problem List Items Addressed This Visit       Cardiology Problems   Hypertension   Relevant Orders   EKG 12-Lead (Completed)   SVT (supraventricular tachycardia) (HCC) - Primary   Relevant Orders   EKG 12-Lead (Completed)   Atrial tachycardia (HCC)   Relevant Orders   EKG 12-Lead (Completed)   Other Visit Diagnoses       SOB (shortness of breath)       Relevant Orders   EKG 12-Lead (Completed)     Mixed hyperlipidemia          Atrial tachycardia/SVT Continue metoprolol  succinate 12.5 daily Propranolol  10 as needed For persistent SVT,  consider carotid sinus massage, Valsalva technique, If symptoms persist may need EMTs for adenosine   Hyperlipidemia No aortic atherosclerosis noted on CT scan 2021 Strong family history father, grandfather Lipid panel ordered  Foot and leg cramping at nighttime Hydrate, consider supplemental electrolytes   Signed, Martia Dalby, MD  [facility] [address], [place], [place] [address]

## 2024-05-30 ENCOUNTER — Ambulatory Visit: Payer: Self-pay | Attending: Cardiovascular Disease | Admitting: Cardiovascular Disease

## 2024-05-30 ENCOUNTER — Encounter: Payer: Self-pay | Admitting: Cardiovascular Disease

## 2024-05-30 VITALS — BP 100/60 | HR 76 | Ht 63.0 in | Wt 103.5 lb

## 2024-05-30 DIAGNOSIS — Z79899 Other long term (current) drug therapy: Secondary | ICD-10-CM

## 2024-05-30 DIAGNOSIS — R0602 Shortness of breath: Secondary | ICD-10-CM | POA: Diagnosis not present

## 2024-05-30 DIAGNOSIS — I4719 Other supraventricular tachycardia: Secondary | ICD-10-CM | POA: Diagnosis not present

## 2024-05-30 DIAGNOSIS — I471 Supraventricular tachycardia, unspecified: Secondary | ICD-10-CM

## 2024-05-30 DIAGNOSIS — I10 Essential (primary) hypertension: Secondary | ICD-10-CM

## 2024-05-30 DIAGNOSIS — E782 Mixed hyperlipidemia: Secondary | ICD-10-CM

## 2024-05-30 NOTE — Patient Instructions (Addendum)
 Medication Instructions:   No changes  If you need a refill on your cardiac medications before your next appointment, please call your pharmacy.   Lab work:  Your provider would like for you to return to have the following labs drawn: A1C, Lipid Panel and CMP.   Please go to [facility] [address] ([facility] [address], [place] [address] You do not need an appointment.  They are open from 8 am- 4:30 pm.  Lunch from 1:00 pm- 2:00 pm You will need to be fasting.   Testing/Procedures: No new testing needed  Follow-Up: At [facility], you and your health needs are our priority.  As part of our continuing mission to provide you with exceptional heart care, we have created designated Provider Care Teams.  These Care Teams include your primary Cardiologist (physician) and Advanced Practice Providers (APPs -  Physician Assistants and Nurse Practitioners) who all work together to provide you with the care you need, when you need it.  You will need a follow up appointment in 12 months  Providers on your designated Care Team:   Lonni Meager, NP Bernardino Bring, PA-C Cadence [place], [place]  COVID-19 Vaccine Information can be found at: [email or web address] For questions related to vaccine distribution or appointments, please email [email or web address] or call [phone].

## 2024-06-10 ENCOUNTER — Other Ambulatory Visit: Payer: Self-pay | Admitting: Cardiovascular Disease

## 2024-06-13 ENCOUNTER — Ambulatory Visit: Payer: Self-pay | Admitting: Family Medicine

## 2024-06-13 ENCOUNTER — Encounter: Payer: Self-pay | Admitting: Family Medicine

## 2024-06-13 VITALS — BP 120/77 | HR 72 | Resp 16 | Ht 63.0 in | Wt 105.0 lb

## 2024-06-13 DIAGNOSIS — Z0001 Encounter for general adult medical examination with abnormal findings: Secondary | ICD-10-CM | POA: Diagnosis not present

## 2024-06-13 DIAGNOSIS — Z23 Encounter for immunization: Secondary | ICD-10-CM | POA: Diagnosis not present

## 2024-06-13 DIAGNOSIS — B009 Herpesviral infection, unspecified: Secondary | ICD-10-CM | POA: Diagnosis not present

## 2024-06-13 DIAGNOSIS — R7303 Prediabetes: Secondary | ICD-10-CM | POA: Diagnosis not present

## 2024-06-13 DIAGNOSIS — J4520 Mild intermittent asthma, uncomplicated: Secondary | ICD-10-CM

## 2024-06-13 DIAGNOSIS — Z0000 Encounter for general adult medical examination without abnormal findings: Secondary | ICD-10-CM

## 2024-06-13 DIAGNOSIS — I10 Essential (primary) hypertension: Secondary | ICD-10-CM

## 2024-06-13 MED ORDER — VALACYCLOVIR HCL 1 G PO TABS: 1000.0000 mg | ORAL_TABLET | Freq: Every day | ORAL | 2 refills | Status: AC

## 2024-06-13 MED ORDER — ALBUTEROL SULFATE HFA 108 (90 BASE) MCG/ACT IN AERS: 2.0000 | INHALATION_SPRAY | Freq: Four times a day (QID) | RESPIRATORY_TRACT | 2 refills | Status: AC | PRN

## 2024-06-13 MED ORDER — PROPRANOLOL HCL 10 MG PO TABS: 10.0000 mg | ORAL_TABLET | Freq: Two times a day (BID) | ORAL | 1 refills | Status: AC | PRN

## 2024-06-13 NOTE — Addendum Note (Signed)
 Addended by: Trenita Hulme E on: 06/13/2024 02:39 PM   Modules accepted: Orders

## 2024-06-13 NOTE — Progress Notes (Signed)
 Complete physical exam   Patient: Betty Rodriguez   DOB: [birth year]   53 y.o. Female  MRN: [id number] Visit Date: 06/13/2024  Today's healthcare provider: Jon Eva, MD   Chief Complaint  Patient presents with   Annual Exam    Patient doing well.She is exercising Sleeping fairly well.   Subjective    Betty Rodriguez is a 53 y.o. female who presents today for a complete physical exam.   Discussed the use of AI scribe software for clinical note transcription with the patient, who gave verbal consent to proceed.  History of Present Illness   Betty Rodriguez is a 53 year old female who presents for an annual physical exam and follow-up on her colonoscopy results.  Her colonoscopy in 2022 revealed a benign growth, and no further colonoscopy is needed until 2032. Her A1c level was 6, and she is willing to have labs done today. Blood pressure was 100/60 at the last visit, attributed to weight loss and healthy eating.  She experiences wheezing, which she associates with post-COVID effects. She has had COVID multiple times, with the first instance causing significant shortness of breath. She has been prescribed an inhaler for wheezing.  She takes valacyclovir  and propranolol  as needed. She has stopped taking birth control pills due to menopause, having not had a period in about two years.        Last depression screening scores    12/06/2023   11:05 AM 06/14/2023    2:36 PM 04/29/2023    2:04 PM  PHQ 2/9 Scores  PHQ - 2 Score 0 0 0  PHQ- 9 Score 4     Last fall risk screening    12/06/2023   11:05 AM  Fall Risk   Falls in the past year? 0  Number falls in past yr: 0  Injury with Fall? 0        Medications: Outpatient Medications Prior to Visit  Medication Sig   metoprolol  succinate (TOPROL -XL) 25 MG 24 hr tablet TAKE 1/2 TABLET BY MOUTH ONCE DAILY. MAY TAKE AN EXTRA 1/2 TABLET ONCE DAILY AS NEEDED FOR BREAKTHROUGH TACHYCARDIA   Multiple Vitamin  (MULTIVITAMIN) capsule Take 1 capsule by mouth daily.   tretinoin (RETIN-A) 0.05 % cream Apply topically at bedtime.   [DISCONTINUED] propranolol  (INDERAL ) 10 MG tablet Take 1 tablet (10 mg total) by mouth 2 (two) times daily as needed.   [DISCONTINUED] valACYclovir  (VALTREX ) 1000 MG tablet Take 1 tablet (1,000 mg total) by mouth daily. For three days as needed.   No facility-administered medications prior to visit.    Review of Systems    Objective    BP 120/77 (BP Location: Left Arm, Patient Position: Sitting, Cuff Size: Normal)   Pulse 72   Resp 16   Ht 5' 3 (1.6 m)   Wt 105 lb (47.6 kg)   LMP  (LMP Unknown)   SpO2 100%   BMI 18.60 kg/m    Physical Exam Vitals reviewed.  Constitutional:      General: She is not in acute distress.    Appearance: Normal appearance. She is well-developed. She is not diaphoretic.  HENT:     Head: Normocephalic and atraumatic.     Right Ear: Tympanic membrane, ear canal and external ear normal.     Left Ear: Tympanic membrane, ear canal and external ear normal.     Nose: Nose normal.     Mouth/Throat:     Mouth: Mucous membranes are moist.  Pharynx: Oropharynx is clear. No oropharyngeal exudate.  Eyes:     General: No scleral icterus.    Conjunctiva/sclera: Conjunctivae normal.     Pupils: Pupils are equal, round, and reactive to light.  Neck:     Thyroid : No thyromegaly.  Cardiovascular:     Rate and Rhythm: Normal rate and regular rhythm.     Heart sounds: Normal heart sounds. No murmur heard. Pulmonary:     Effort: Pulmonary effort is normal. No respiratory distress.     Breath sounds: Wheezing present. No rales.  Abdominal:     General: There is no distension.     Palpations: Abdomen is soft.     Tenderness: There is no abdominal tenderness.  Musculoskeletal:        General: No deformity.     Cervical back: Neck supple.     Right lower leg: No edema.     Left lower leg: No edema.  Lymphadenopathy:     Cervical: No  cervical adenopathy.  Skin:    General: Skin is warm and dry.     Findings: No rash.  Neurological:     Mental Status: She is alert and oriented to person, place, and time. Mental status is at baseline.     Gait: Gait normal.  Psychiatric:        Mood and Affect: Mood normal.        Behavior: Behavior normal.        Thought Content: Thought content normal.      No results found for any visits on 06/13/24.  Assessment & Plan    Routine Health Maintenance and Physical Exam  Exercise Activities and Dietary recommendations  Goals   None     Immunization History  Administered Date(s) Administered   Influenza, Seasonal, Injecte, Preservative Fre 09/29/2023   Influenza,inj,Quad PF,6+ Mos 08/30/2019, 07/24/2020, 10/01/2021   Influenza-Unspecified 10/15/2017, 09/09/2018, 10/16/2022   PFIZER(Purple Top)SARS-COV-2 Vaccination 02/02/2020, 03/13/2020   Tdap 05/23/2018   Zoster Recombinant(Shingrix ) 05/14/2022, 07/30/2022    Health Maintenance  Topic Date Due   Hepatitis B Vaccines (1 of 3 - 19+ 3-dose series) Never done   COVID-19 Vaccine (3 - Pfizer risk series) 04/10/2020   INFLUENZA VACCINE  06/30/2024   MAMMOGRAM  05/22/2026   DTaP/Tdap/Td (2 - Td or Tdap) 05/23/2028   Cervical Cancer Screening (HPV/Pap Cotest)  06/13/2028   Colonoscopy  07/23/2031   Hepatitis C Screening  Completed   HIV Screening  Completed   Zoster Vaccines- Shingrix   Completed   HPV VACCINES  Aged Out   Meningococcal B Vaccine  Aged Out   Fecal DNA (Cologuard)  Discontinued    Discussed health benefits of physical activity, and encouraged her to engage in regular exercise appropriate for her age and condition.  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 Possible asthma-like symptoms post-COVID. - Prescribe albuterol  inhaler for as-needed use - Send prescription to [facility]  Prediabetes A1c previously recorded at 6, indicating prediabetes. - Perform lab tests as ordered by cardiologist today  Adult Wellness Visit Routine adult wellness visit. Blood pressure is well-controlled. Weight loss and healthy eating noted. Up to date on Pap smear and mammogram. Colonoscopy in 2022 showed benign mucosal growth, considered normal. Hepatitis B vaccination records are complete. Shingles vaccine series completed. Tetanus vaccine due in 2029. - Administer pneumonia vaccine today - Schedule next physical in one year - Send after visit summary through MyChart  Menopause Menopause confirmed as no menstrual period for two years. - Discontinued birth control pills        Return in about 1 year (around 06/13/2025) for CPE.     Jon Eva, MD  [facility] [phone] (phone) [phone] (fax)  [facility]

## 2024-06-15 ENCOUNTER — Encounter: Payer: Self-pay | Admitting: Family Medicine

## 2024-06-20 LAB — COMPREHENSIVE METABOLIC PANEL WITH GFR
ALT: 23 IU/L (ref 0–32)
AST: 26 IU/L (ref 0–40)
Albumin: 4.4 g/dL (ref 3.8–4.9)
Alkaline Phosphatase: 61 IU/L (ref 44–121)
BUN/Creatinine Ratio: 15 (ref 9–23)
BUN: 12 mg/dL (ref 6–24)
Bilirubin Total: 0.4 mg/dL (ref 0.0–1.2)
CO2: 23 mmol/L (ref 20–29)
Calcium: 9.6 mg/dL (ref 8.7–10.2)
Chloride: 102 mmol/L (ref 96–106)
Creatinine, Ser: 0.81 mg/dL (ref 0.57–1.00)
Globulin, Total: 2.4 g/dL (ref 1.5–4.5)
Glucose: 96 mg/dL (ref 70–99)
Potassium: 5.2 mmol/L (ref 3.5–5.2)
Sodium: 138 mmol/L (ref 134–144)
Total Protein: 6.8 g/dL (ref 6.0–8.5)
eGFR: 87 mL/min/1.73 (ref >59)

## 2024-06-20 LAB — LIPID PANEL
Chol/HDL Ratio: 2.3 ratio (ref 0.0–4.4)
Cholesterol, Total: 194 mg/dL (ref 100–199)
HDL: 86 mg/dL (ref >39)
LDL Chol Calc (NIH): 94 mg/dL (ref 0–99)
Triglycerides: 75 mg/dL (ref 0–149)
VLDL Cholesterol Cal: 14 mg/dL (ref 5–40)

## 2024-06-20 LAB — HEMOGLOBIN A1C
Est. average glucose Bld gHb Est-mCnc: 117 mg/dL
Hgb A1c MFr Bld: 5.7 % — ABNORMAL HIGH (ref 4.8–5.6)

## 2024-06-24 ENCOUNTER — Ambulatory Visit: Payer: Self-pay | Admitting: Cardiovascular Disease

## 2024-07-03 ENCOUNTER — Encounter: Payer: Self-pay | Admitting: Cardiovascular Disease

## 2024-08-23 ENCOUNTER — Ambulatory Visit (INDEPENDENT_AMBULATORY_CARE_PROVIDER_SITE_OTHER): Admitting: Clinical

## 2024-08-23 DIAGNOSIS — F411 Generalized anxiety disorder: Secondary | ICD-10-CM | POA: Diagnosis not present
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
## 2024-08-23 NOTE — Progress Notes (Signed)
   Darice Seats, LCSW

## 2024-09-06 ENCOUNTER — Ambulatory Visit: Admitting: Clinical

## 2024-10-11 ENCOUNTER — Ambulatory Visit: Admitting: Clinical

## 2024-10-11 DIAGNOSIS — F411 Generalized anxiety disorder: Secondary | ICD-10-CM | POA: Diagnosis not present

## 2024-10-11 DIAGNOSIS — F410 Panic disorder [episodic paroxysmal anxiety] without agoraphobia: Secondary | ICD-10-CM | POA: Diagnosis not present
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
## 2024-10-17 ENCOUNTER — Other Ambulatory Visit: Payer: Self-pay | Admitting: Orthopedic Surgery

## 2024-10-30 ENCOUNTER — Encounter: Payer: Self-pay | Admitting: Orthopedic Surgery

## 2024-10-30 NOTE — Anesthesia Preprocedure Evaluation (Addendum)
 Anesthesia Evaluation  Patient identified by MRN, date of birth, ID band Patient awake    Reviewed: Allergy & Precautions, H&P , NPO status , Patient's Chart, lab work & pertinent test results  Airway Mallampati: II  TM Distance: <3 FB Neck ROM: Full    Dental no notable dental hx. (+) Caps No caps in front teeth:   Pulmonary neg pulmonary ROS, shortness of breath   Pulmonary exam normal breath sounds clear to auscultation       Cardiovascular hypertension, negative cardio ROS Normal cardiovascular exam Rhythm:Regular Rate:Normal  EKG Interpretation Date/Time:                  Tuesday May 30 2024 16:38:45 EDT Ventricular Rate:         76 PR Interval:                 94 QRS Duration:             82 QT Interval:                 400 QTC Calculation:450 R Axis:                         63   Text Interpretation:Sinus rhythm with short PR When compared with ECG of 30-May-2024 16:37, No significant change was found Confirmed by Perla Lye [phone]) on 05/30/2024 4:45:18 PM    Past medical history reviewed Seen in August 2019  second SVT episode  Got overheated,     episode of tachycardia November 2018 acute onset of tachycardia rate 160 bpm,  was watching TV, ate pizza,, 2:30 p.m.  acute tachycardia Rate 165,  Took diltiazem  metoprolol  xl 25    Echocardiogram performed on 6/13 showed normal LV systolic function with EF of 55-60%, normal wall motion, normal LV diastolic function parameters.  Atrial tachycardia/SVT Continue metoprolol  succinate 12.5 daily Propranolol  10 as needed For persistent SVT, consider carotid sinus massage, Valsalva technique, If symptoms persist may need EMTs for adenosine    Hyperlipidemia No aortic atherosclerosis noted on CT scan 2021 Strong family history father, grandfather       Neuro/Psych  PSYCHIATRIC DISORDERS Anxiety     negative neurological ROS  negative psych ROS    GI/Hepatic negative GI ROS, Neg liver ROS,GERD  ,,  Endo/Other  negative endocrine ROS    Renal/GU Renal diseasenegative Renal ROS  negative genitourinary   Musculoskeletal negative musculoskeletal ROS (+) Arthritis ,    Abdominal   Peds negative pediatric ROS (+)  Hematology negative hematology ROS (+)   Anesthesia Other Findings Medical History  Kidney stones  Allergy  Anxiety History of cold sores  Arthritis Generalized anxiety disorder  SVT (supraventricular tachycardia) Atrial tachycardia  Shortness of breath    Reproductive/Obstetrics negative OB ROS                              Anesthesia Physical Anesthesia Plan  ASA: 3  Anesthesia Plan: General   Post-op Pain Management:    Induction: Intravenous  PONV Risk Score and Plan:   Airway Management Planned: Natural Airway, Nasal Cannula and LMA  Additional Equipment:   Intra-op Plan:   Post-operative Plan: Extubation in OR  Informed Consent: I have reviewed the patients History and Physical, chart, labs and discussed the procedure including the risks, benefits and alternatives for the proposed anesthesia with the patient or authorized representative who has  indicated his/her understanding and acceptance.     Dental Advisory Given  Plan Discussed with: Anesthesiologist, CRNA and Surgeon  Anesthesia Plan Comments: (Patient consented for risks of anesthesia including but not limited to:  - adverse reactions to medications - risk of airway placement if required - damage to eyes, teeth, lips or other oral mucosa - nerve damage due to positioning  - sore throat or hoarseness - Damage to heart, brain, nerves, lungs, other parts of body or loss of life  Patient voiced understanding and assent.)         Anesthesia Quick Evaluation

## 2024-11-03 ENCOUNTER — Ambulatory Visit: Payer: Self-pay | Admitting: Anesthesiology

## 2024-11-03 ENCOUNTER — Encounter: Payer: Self-pay | Admitting: Orthopedic Surgery

## 2024-11-03 ENCOUNTER — Other Ambulatory Visit: Payer: Self-pay

## 2024-11-03 ENCOUNTER — Ambulatory Visit
Admission: RE | Admit: 2024-11-03 | Discharge: 2024-11-03 | Disposition: A | Payer: Self-pay | Attending: Orthopedic Surgery | Admitting: Orthopedic Surgery

## 2024-11-03 ENCOUNTER — Encounter: Admission: RE | Disposition: A | Payer: Self-pay | Source: Home / Self Care | Attending: Orthopedic Surgery

## 2024-11-03 HISTORY — PX: BICEPT TENODESIS: SHX5116

## 2024-11-03 HISTORY — DX: Unspecified osteoarthritis, unspecified site: M19.90

## 2024-11-03 HISTORY — PX: SUBACROMIAL DECOMPRESSION: SHX5174

## 2024-11-03 HISTORY — DX: Shortness of breath: R06.02

## 2024-11-03 HISTORY — PX: SHOULDER ARTHROSCOPY: SHX128

## 2024-11-03 HISTORY — DX: Other supraventricular tachycardia: I47.19

## 2024-11-03 HISTORY — DX: Generalized anxiety disorder: F41.1

## 2024-11-03 SURGERY — REPAIR, ROTATOR CUFF, ARTHROSCOPIC
Anesthesia: General | Site: Shoulder | Laterality: Left

## 2024-11-03 MED ORDER — OXYCODONE HCL 5 MG PO TABS: 5.0000 mg | ORAL_TABLET | ORAL | 0 refills | Status: AC | PRN

## 2024-11-03 MED ORDER — ASPIRIN 325 MG PO TBEC: 325.0000 mg | DELAYED_RELEASE_TABLET | Freq: Every day | ORAL | 0 refills | Status: AC

## 2024-11-03 MED ORDER — LACTATED RINGERS IV SOLN
INTRAVENOUS | Status: DC | PRN
  Administered 2024-11-03: 4 mL

## 2024-11-03 MED ORDER — PHENYLEPHRINE HCL (PRESSORS) 10 MG/ML IV SOLN
INTRAVENOUS | Status: DC | PRN
  Administered 2024-11-03 (×3): 80 ug via INTRAVENOUS

## 2024-11-03 MED ORDER — LIDOCAINE HCL (PF) 2 % IJ SOLN
INTRAMUSCULAR | Status: AC
  Filled 2024-11-03: qty 5

## 2024-11-03 MED ORDER — LACTATED RINGERS IR SOLN
Status: DC | PRN
  Administered 2024-11-03: 12000 mL

## 2024-11-03 MED ORDER — ONDANSETRON HCL 4 MG/2ML IJ SOLN
INTRAMUSCULAR | Status: AC
  Filled 2024-11-03: qty 2

## 2024-11-03 MED ORDER — FENTANYL CITRATE (PF) 100 MCG/2ML IJ SOLN
100.0000 ug | Freq: Once | INTRAMUSCULAR | Status: AC
  Administered 2024-11-03 (×2): 50 ug via INTRAVENOUS

## 2024-11-03 MED ORDER — BUPIVACAINE HCL (PF) 0.5 % IJ SOLN
INTRAMUSCULAR | Status: AC
  Filled 2024-11-03: qty 30

## 2024-11-03 MED ORDER — MIDAZOLAM HCL 2 MG/2ML IJ SOLN
INTRAMUSCULAR | Status: AC
  Filled 2024-11-03: qty 2

## 2024-11-03 MED ORDER — FENTANYL CITRATE (PF) 100 MCG/2ML IJ SOLN
INTRAMUSCULAR | Status: AC
  Filled 2024-11-03: qty 2

## 2024-11-03 MED ORDER — LIDOCAINE HCL (CARDIAC) PF 100 MG/5ML IV SOSY
PREFILLED_SYRINGE | INTRAVENOUS | Status: DC | PRN
  Administered 2024-11-03: 100 mg via INTRATRACHEAL

## 2024-11-03 MED ORDER — MIDAZOLAM HCL 5 MG/5ML IJ SOLN
INTRAMUSCULAR | Status: DC | PRN
  Administered 2024-11-03: 2 mg via INTRAVENOUS

## 2024-11-03 MED ORDER — DEXAMETHASONE SODIUM PHOSPHATE 4 MG/ML IJ SOLN
INTRAMUSCULAR | Status: DC | PRN
  Administered 2024-11-03: 4 mg via INTRAVENOUS

## 2024-11-03 MED ORDER — PROPOFOL 10 MG/ML IV BOLUS
INTRAVENOUS | Status: DC | PRN
  Administered 2024-11-03: 160 mg via INTRAVENOUS
  Administered 2024-11-03: 50 ug/kg/min via INTRAVENOUS

## 2024-11-03 MED ORDER — BUPIVACAINE LIPOSOME 1.3 % IJ SUSP
INTRAMUSCULAR | Status: AC
  Filled 2024-11-03: qty 10

## 2024-11-03 MED ORDER — ONDANSETRON 4 MG PO TBDP: 4.0000 mg | ORAL_TABLET | Freq: Three times a day (TID) | ORAL | 0 refills | Status: AC | PRN

## 2024-11-03 MED ORDER — LIDOCAINE HCL (PF) 2 % IJ SOLN
INTRAMUSCULAR | Status: AC
  Filled 2024-11-03: qty 2

## 2024-11-03 MED ORDER — SEVOFLURANE IN SOLN
RESPIRATORY_TRACT | Status: AC
  Filled 2024-11-03: qty 250

## 2024-11-03 MED ORDER — FENTANYL CITRATE (PF) 100 MCG/2ML IJ SOLN
INTRAMUSCULAR | Status: DC | PRN
  Administered 2024-11-03: 100 ug via INTRAVENOUS

## 2024-11-03 MED ORDER — LACTATED RINGERS IV SOLN: INTRAVENOUS | Status: DC

## 2024-11-03 MED ORDER — MIDAZOLAM HCL (PF) 2 MG/2ML IJ SOLN
2.0000 mg | INTRAMUSCULAR | Status: AC | PRN
  Administered 2024-11-03: 1 mg via INTRAVENOUS
  Administered 2024-11-03: 2 mg via INTRAVENOUS

## 2024-11-03 MED ORDER — ONDANSETRON HCL 4 MG/2ML IJ SOLN
INTRAMUSCULAR | Status: DC | PRN
  Administered 2024-11-03: 4 mg via INTRAVENOUS

## 2024-11-03 MED ORDER — ACETAMINOPHEN 500 MG PO TABS: 1000.0000 mg | ORAL_TABLET | Freq: Three times a day (TID) | ORAL | 2 refills | Status: AC

## 2024-11-03 MED ORDER — CEFAZOLIN SODIUM-DEXTROSE 2-3 GM-%(50ML) IV SOLR
INTRAVENOUS | Status: AC
  Filled 2024-11-03: qty 50

## 2024-11-03 MED ORDER — DEXAMETHASONE SODIUM PHOSPHATE 4 MG/ML IJ SOLN
INTRAMUSCULAR | Status: AC
  Filled 2024-11-03: qty 1

## 2024-11-03 MED ORDER — EPHEDRINE SULFATE (PRESSORS) 25 MG/5ML IV SOSY
PREFILLED_SYRINGE | INTRAVENOUS | Status: DC | PRN
  Administered 2024-11-03 (×3): 5 mg via INTRAVENOUS

## 2024-11-03 MED ORDER — CEFAZOLIN SODIUM-DEXTROSE 2-4 GM/100ML-% IV SOLN
2.0000 g | INTRAVENOUS | Status: AC
  Administered 2024-11-03: 2 g via INTRAVENOUS

## 2024-11-03 SURGICAL SUPPLY — 39 items
ANCHOR SWIVELOCK SP KL 4.75 (Anchor) IMPLANT
BLADE SHAVER 4.5X7 STR FR (MISCELLANEOUS) ×1 IMPLANT
BUR BR 5.5 WIDE MOUTH (BURR) ×1 IMPLANT
CANNULA PART THRD DISP 5.75X7 (CANNULA) ×1 IMPLANT
CANNULA TWIST IN 8.25X7CM (CANNULA) IMPLANT
CHLORAPREP W/TINT 26 (MISCELLANEOUS) ×1 IMPLANT
COOLER ICEMAN CLASSIC (MISCELLANEOUS) ×1 IMPLANT
COVER LIGHT HANDLE UNIVERSAL (MISCELLANEOUS) ×3 IMPLANT
CRADLE LAMINECT ARM (MISCELLANEOUS) ×1 IMPLANT
DERMABOND ADVANCED .7 DNX12 (GAUZE/BANDAGES/DRESSINGS) ×1 IMPLANT
DRAPE STERI 35X30 U-POUCH (DRAPES) ×1 IMPLANT
DRSG TEGADERM 4X4.75 (GAUZE/BANDAGES/DRESSINGS) ×3 IMPLANT
ELECTRODE REM PT RTRN 9FT ADLT (ELECTROSURGICAL) ×1 IMPLANT
GAUZE SPONGE 4X4 12PLY STRL (GAUZE/BANDAGES/DRESSINGS) ×1 IMPLANT
GAUZE XEROFORM 1X8 LF (GAUZE/BANDAGES/DRESSINGS) ×1 IMPLANT
GLOVE BIOGEL PI IND STRL 8 (GLOVE) ×2 IMPLANT
GLOVE SURG SS PI 7.5 STRL IVOR (GLOVE) ×3 IMPLANT
GLOVE SURG SYN 8.0 PF PI (GLOVE) ×1 IMPLANT
GOWN STRL REIN 2XL XLG LVL4 (GOWN DISPOSABLE) ×1 IMPLANT
GOWN STRL REUS W/ TWL LRG LVL3 (GOWN DISPOSABLE) ×3 IMPLANT
IV LR IRRIG 3000ML ARTHROMATIC (IV SOLUTION) ×4 IMPLANT
KIT STABILIZATION SHOULDER (MISCELLANEOUS) ×1 IMPLANT
KIT TURNOVER KIT A (KITS) ×1 IMPLANT
MANIFOLD NEPTUNE II (INSTRUMENTS) ×1 IMPLANT
MASK FACE SPIDER DISP (MASK) ×1 IMPLANT
MAT ABSORB FLUID 56X50 GRAY (MISCELLANEOUS) ×2 IMPLANT
PACK ARTHROSCOPY SHOULDER (MISCELLANEOUS) ×1 IMPLANT
PAD ABD DERMACEA PRESS 5X9 (GAUZE/BANDAGES/DRESSINGS) ×2 IMPLANT
PAD COLD SHLDR WRAP-ON (PAD) IMPLANT
PASSER SUT FIRSTPASS SELF (INSTRUMENTS) IMPLANT
SET Y ADAPTER MULIT-BAG IRRIG (MISCELLANEOUS) ×2 IMPLANT
SLING ULTRA II M (MISCELLANEOUS) IMPLANT
SUTURE EHLN 3-0 FS-10 30 BLK (SUTURE) IMPLANT
SUTURETAPE 1.3 40 W/NDL BLK/WH (SUTURE) IMPLANT
SYSTEM IMPL TENODESIS LNT 2.9 (Orthopedic Implant) IMPLANT
TUBE SET DOUBLEFLO INFLOW (TUBING) ×1 IMPLANT
TUBING CONNECTING 10 (TUBING) ×1 IMPLANT
TUBING OUTFLOW SET DBLFO PUMP (TUBING) ×1 IMPLANT
WAND WEREWOLF FLOW 90D (MISCELLANEOUS) ×1 IMPLANT

## 2024-11-03 NOTE — Discharge Instructions (Addendum)
 Post-Op Instructions - Rotator Cuff Repair  1. Bracing: You will wear a shoulder immobilizer or sling for 4 weeks.   2. Driving: No driving for 4 weeks post-op.  3. Activity: No active lifting for 2 months. Wrist, hand, and elbow motion only. Avoid lifting the upper arm away from the body except for hygiene. You are permitted to bend and straighten the elbow passively only (no active elbow motion). You may use your hand and wrist for typing, writing, and managing utensils (cutting food). Do not lift more than a coffee cup for 8 weeks.  When sleeping or resting, inclined positions (recliner chair or wedge pillow) and a pillow under the forearm for support may provide better comfort for up to 4 weeks.  Avoid long distance travel for 4 weeks.  Return to normal activities after rotator cuff repair repair normally takes 6 months on average. If rehab goes very well, may be able to do most activities at 4 months, except overhead or contact sports.  4. Physical Therapy: Begins 3-4 days after surgery, and proceed 1 time per week for the first 6 weeks, then 1-2 times per week from weeks 6-20 post-op.  5. Medications:  - You will be provided a prescription for narcotic pain medicine. After surgery, take 1-2 narcotic tablets every 4 hours if needed for severe pain.  - A prescription for anti-nausea medication will be provided in case the narcotic medicine causes nausea - take 1 tablet every 6 hours only if nauseated.   - Take tylenol  1000 mg (2 Extra Strength tablets or 3 regular strength) every 8 hours for pain.  May decrease or stop tylenol  5 days after surgery if you are having minimal pain. - Take ASA 325mg /day x 2 weeks to help prevent DVTs/PEs (blood clots).  - DO NOT take ANY nonsteroidal anti-inflammatory pain medications (Advil , Motrin , Ibuprofen , Aleve, Naproxen, or Naprosyn). These medicines can inhibit healing of your shoulder repair.    If you are taking prescription medication for anxiety,  depression, insomnia, muscle spasm, chronic pain, or for attention deficit disorder, you are advised that you are at a higher risk of adverse effects with use of narcotics post-op, including narcotic addiction/dependence, depressed breathing, death. If you use non-prescribed substances: alcohol, marijuana, cocaine, heroin, methamphetamines, etc., you are at a higher risk of adverse effects with use of narcotics post-op, including narcotic addiction/dependence, depressed breathing, death. You are advised that taking > 50 morphine milligram equivalents (MME) of narcotic pain medication per day results in twice the risk of overdose or death. For your prescription provided: oxycodone  5 mg - taking more than 6 tablets per day would result in > 50 morphine milligram equivalents (MME) of narcotic pain medication. Be advised that we will prescribe narcotics short-term, for acute post-operative pain only - 3 weeks for major operations such as shoulder repair/reconstruction surgeries.     6. Post-Op Appointment:  Your first post-op appointment will be 10-14 days post-op.  7. Work or School: For most, but not all procedures, we advise staying out of work or school for at least 1 to 2 weeks in order to recover from the stress of surgery and to allow time for healing.   If you need a work or school note this can be provided.   8. Smoking: If you are a smoker, you need to refrain from smoking in the postoperative period. The nicotine in cigarettes will inhibit healing of your shoulder repair and decrease the chance of successful repair. Similarly, nicotine containing products (  gum, patches) should be avoided.   Post-operative Brace: Apply and remove the brace you received as you were instructed to at the time of fitting and as described in detail as the brace's instructions for use indicate.  Wear the brace for the period of time prescribed by your physician.  The brace can be cleaned with soap and water and  allowed to air dry only.  Should the brace result in increased pain, decreased feeling (numbness/tingling), increased swelling or an overall worsening of your medical condition, please contact your doctor immediately.  If an emergency situation occurs as a result of wearing the brace after normal business hours, please dial 911 and seek immediate medical attention.  Let your doctor know if you have any further questions about the brace issued to you. Refer to the shoulder sling instructions for use if you have any questions regarding the correct fit of your shoulder sling.  [facility] Customer Care for Troubleshooting: [phone]  Video that illustrates how to properly use a shoulder sling: Instructions for Proper Use of an Orthopaedic Sling Http://bass.com/     Information for Discharge Teaching: EXPAREL  (bupivacaine  liposome injectable suspension)   Pain relief is important to your recovery. The goal is to control your pain so you can move easier and return to your normal activities as soon as possible after your procedure. Your physician may use several types of medicines to manage pain, swelling, and more.  Your surgeon or anesthesiologist gave you EXPAREL (bupivacaine ) to help control your pain after surgery.  EXPAREL  is a local anesthetic designed to release slowly over an extended period of time to provide pain relief by numbing the tissue around the surgical site. EXPAREL  is designed to release pain medication over time and can control pain for up to 72 hours. Depending on how you respond to EXPAREL , you may require less pain medication during your recovery. EXPAREL  can help reduce or eliminate the need for opioids during the first few days after surgery when pain relief is needed the most. EXPAREL  is not an opioid and is not addictive. It does not cause sleepiness or sedation.   Important! A teal colored band has been placed on your arm with the date, time and  amount of EXPAREL  you have received. Please leave this armband in place for the full 96 hours following administration, and then you may remove the band. If you return to the hospital for any reason within 96 hours following the administration of EXPAREL , the armband provides important information that your health care providers to know, and alerts them that you have received this anesthetic.    Possible side effects of EXPAREL : Temporary loss of sensation or ability to move in the area where medication was injected. Nausea, vomiting, constipation Rarely, numbness and tingling in your mouth or lips, lightheadedness, or anxiety may occur. Call your doctor right away if you think you may be experiencing any of these sensations, or if you have other questions regarding possible side effects.  Follow all other discharge instructions given to you by your surgeon or nurse. Eat a healthy diet and drink plenty of water or other fluids.Information for Discharge Teaching: EXPAREL  (bupivacaine  liposome injectable suspension)   Pain relief is important to your recovery. The goal is to control your pain so you can move easier and return to your normal activities as soon as possible after your procedure. Your physician may use several types of medicines to manage pain, swelling, and more.  Your surgeon or anesthesiologist gave you  EXPAREL (bupivacaine ) to help control your pain after surgery.  EXPAREL  is a local anesthetic designed to release slowly over an extended period of time to provide pain relief by numbing the tissue around the surgical site. EXPAREL  is designed to release pain medication over time and can control pain for up to 72 hours. Depending on how you respond to EXPAREL , you may require less pain medication during your recovery. EXPAREL  can help reduce or eliminate the need for opioids during the first few days after surgery when pain relief is needed the most. EXPAREL  is not an opioid and is not  addictive. It does not cause sleepiness or sedation.   Important! A teal colored band has been placed on your arm with the date, time and amount of EXPAREL  you have received. Please leave this armband in place for the full 96 hours following administration, and then you may remove the band. If you return to the hospital for any reason within 96 hours following the administration of EXPAREL , the armband provides important information that your health care providers to know, and alerts them that you have received this anesthetic.    Possible side effects of EXPAREL : Temporary loss of sensation or ability to move in the area where medication was injected. Nausea, vomiting, constipation Rarely, numbness and tingling in your mouth or lips, lightheadedness, or anxiety may occur. Call your doctor right away if you think you may be experiencing any of these sensations, or if you have other questions regarding possible side effects.  Follow all other discharge instructions given to you by your surgeon or nurse. Eat a healthy diet and drink plenty of water or other fluids.Information for Discharge Teaching: EXPAREL  (bupivacaine  liposome injectable suspension)   Pain relief is important to your recovery. The goal is to control your pain so you can move easier and return to your normal activities as soon as possible after your procedure. Your physician may use several types of medicines to manage pain, swelling, and more.  Your surgeon or anesthesiologist gave you EXPAREL (bupivacaine ) to help control your pain after surgery.  EXPAREL  is a local anesthetic designed to release slowly over an extended period of time to provide pain relief by numbing the tissue around the surgical site. EXPAREL  is designed to release pain medication over time and can control pain for up to 72 hours. Depending on how you respond to EXPAREL , you may require less pain medication during your recovery. EXPAREL  can help reduce or  eliminate the need for opioids during the first few days after surgery when pain relief is needed the most. EXPAREL  is not an opioid and is not addictive. It does not cause sleepiness or sedation.   Important! A teal colored band has been placed on your arm with the date, time and amount of EXPAREL  you have received. Please leave this armband in place for the full 96 hours following administration, and then you may remove the band. If you return to the hospital for any reason within 96 hours following the administration of EXPAREL , the armband provides important information that your health care providers to know, and alerts them that you have received this anesthetic.    Possible side effects of EXPAREL : Temporary loss of sensation or ability to move in the area where medication was injected. Nausea, vomiting, constipation Rarely, numbness and tingling in your mouth or lips, lightheadedness, or anxiety may occur. Call your doctor right away if you think you may be experiencing any of these sensations, or if you  have other questions regarding possible side effects.  Follow all other discharge instructions given to you by your surgeon or nurse. Eat a healthy diet and drink plenty of water or other fluids.Information for Discharge Teaching: EXPAREL  (bupivacaine  liposome injectable suspension)   Pain relief is important to your recovery. The goal is to control your pain so you can move easier and return to your normal activities as soon as possible after your procedure. Your physician may use several types of medicines to manage pain, swelling, and more.  Your surgeon or anesthesiologist gave you EXPAREL (bupivacaine ) to help control your pain after surgery.  EXPAREL  is a local anesthetic designed to release slowly over an extended period of time to provide pain relief by numbing the tissue around the surgical site. EXPAREL  is designed to release pain medication over time and can control pain for up  to 72 hours. Depending on how you respond to EXPAREL , you may require less pain medication during your recovery. EXPAREL  can help reduce or eliminate the need for opioids during the first few days after surgery when pain relief is needed the most. EXPAREL  is not an opioid and is not addictive. It does not cause sleepiness or sedation.   Important! A teal colored band has been placed on your arm with the date, time and amount of EXPAREL  you have received. Please leave this armband in place for the full 96 hours following administration, and then you may remove the band. If you return to the hospital for any reason within 96 hours following the administration of EXPAREL , the armband provides important information that your health care providers to know, and alerts them that you have received this anesthetic.    Possible side effects of EXPAREL : Temporary loss of sensation or ability to move in the area where medication was injected. Nausea, vomiting, constipation Rarely, numbness and tingling in your mouth or lips, lightheadedness, or anxiety may occur. Call your doctor right away if you think you may be experiencing any of these sensations, or if you have other questions regarding possible side effects.  Follow all other discharge instructions given to you by your surgeon or nurse. Eat a healthy diet and drink plenty of water or other fluids.    POLAR CARE INFORMATION  [email or web address][employer][email or web address]  How to use [facility] Polar [facility] Therapy System?  YouTube   [email or web address]  OPERATING INSTRUCTIONS  Start the product With dry hands, connect the transformer to the electrical connection located on the top of the cooler. Next, plug the transformer into an appropriate electrical outlet. The unit will automatically start running at this point.  To stop the pump, disconnect electrical power.  Unplug to stop the product when not in use. Unplugging the Polar Care unit turns it  off. Always unplug immediately after use. Never leave it plugged in while unattended. Remove pad.    FIRST ADD WATER TO FILL LINE, THEN ICE---Replace ice when existing ice is almost melted  1 Discuss Treatment with your Licensed Health Care Practitioner and Use Only as Prescribed 2 Apply Insulation Barrier & Cold Therapy Pad 3 Check for Moisture 4 Inspect Skin Regularly  Tips and Trouble Shooting Usage Tips 1. Use cubed or chunked ice for optimal performance. 2. It is recommended to drain the Pad between uses. To drain the pad, hold the Pad upright with the hose pointed toward the ground. Depress the black plunger and allow water to drain out. 3. You may disconnect the Pad from the unit without removing the  pad from the affected area by depressing the silver tabs on the hose coupling and gently pulling the hoses apart. The Pad and unit will seal itself and will not leak. Note: Some dripping during release is normal. 4. DO NOT RUN PUMP WITHOUT WATER! The pump in this unit is designed to run with water. Running the unit without water will cause permanent damage to the pump. 5. Unplug unit before removing lid.  TROUBLESHOOTING GUIDE Pump not running, Water not flowing to the pad, Pad is not getting cold 1. Make sure the transformer is plugged into the wall outlet. 2. Confirm that the ice and water are filled to the indicated levels. 3. Make sure there are no kinks in the pad. 4. Gently pull on the blue tube to make sure the tube/pad junction is straight. 5. Remove the pad from the treatment site and ll it while the pad is lying at; then reapply. 6. Confirm that the pad couplings are securely attached to the unit. Listen for the double clicks (Figure 1) to confirm the pad couplings are securely attached.  Leaks    Note: Some condensation on the lines, controller, and pads is unavoidable, especially in warmer climates. 1. If using a [employer] Polar Care Cold Therapy unit with a detachable Cold  Therapy Pad, and a leak exists (other than condensation on the lines) disconnect the pad couplings. Make sure the silver tabs on the couplings are depressed before reconnecting the pad to the pump hose; then confirm both sides of the coupling are properly clicked in. 2. If the coupling continues to leak or a leak is detected in the pad itself, stop using it and call [employer] Customer Care at [phone].  Cleaning After use, empty and dry the unit with a soft cloth. Warm water and mild detergent may be used occasionally to clean the pump and tubes.  WARNING: The Polar Care Cube can be cold enough to cause serious injury, including full skin necrosis. Follow these Operating Instructions, and carefully read the Product Insert (see pouch on side of unit) and the Cold Therapy Pad Fitting Instructions (provided with each Cold Therapy Pad) prior to use.       POLAR CARE INFORMATION  [email or web address].[email or web address]  How to use [facility] Polar Care [facility] Therapy System?  YouTube   [email or web address]  OPERATING INSTRUCTIONS  Start the product With dry hands, connect the transformer to the electrical connection located on the top of the cooler. Next, plug the transformer into an appropriate electrical outlet. The unit will automatically start running at this point.  To stop the pump, disconnect electrical power.  Unplug to stop the product when not in use. Unplugging the Polar Care unit turns it off. Always unplug immediately after use. Never leave it plugged in while unattended. Remove pad.    FIRST ADD WATER TO FILL LINE, THEN ICE---Replace ice when existing ice is almost melted  1 Discuss Treatment with your Licensed Health Care Practitioner and Use Only as Prescribed 2 Apply Insulation Barrier & Cold Therapy Pad 3 Check for Moisture 4 Inspect Skin Regularly  Tips and Trouble Shooting Usage Tips 1. Use cubed or chunked ice for optimal performance. 2. It is recommended to drain the  Pad between uses. To drain the pad, hold the Pad upright with the hose pointed toward the ground. Depress the black plunger and allow water to drain out. 3. You may disconnect the Pad from the unit without removing the pad from the affected area by depressing  the silver tabs on the hose coupling and gently pulling the hoses apart. The Pad and unit will seal itself and will not leak. Note: Some dripping during release is normal. 4. DO NOT RUN PUMP WITHOUT WATER! The pump in this unit is designed to run with water. Running the unit without water will cause permanent damage to the pump. 5. Unplug unit before removing lid.  TROUBLESHOOTING GUIDE Pump not running, Water not flowing to the pad, Pad is not getting cold 1. Make sure the transformer is plugged into the wall outlet. 2. Confirm that the ice and water are filled to the indicated levels. 3. Make sure there are no kinks in the pad. 4. Gently pull on the blue tube to make sure the tube/pad junction is straight. 5. Remove the pad from the treatment site and ll it while the pad is lying at; then reapply. 6. Confirm that the pad couplings are securely attached to the unit. Listen for the double clicks (Figure 1) to confirm the pad couplings are securely attached.  Leaks    Note: Some condensation on the lines, controller, and pads is unavoidable, especially in warmer climates. 1. If using a [employer] Polar Care Cold Therapy unit with a detachable Cold Therapy Pad, and a leak exists (other than condensation on the lines) disconnect the pad couplings. Make sure the silver tabs on the couplings are depressed before reconnecting the pad to the pump hose; then confirm both sides of the coupling are properly clicked in. 2. If the coupling continues to leak or a leak is detected in the pad itself, stop using it and call [employer] Customer Care at [phone].  Cleaning After use, empty and dry the unit with a soft cloth. Warm water and mild detergent may be used  occasionally to clean the pump and tubes.  WARNING: The Polar Care Cube can be cold enough to cause serious injury, including full skin necrosis. Follow these Operating Instructions, and carefully read the Product Insert (see pouch on side of unit) and the Cold Therapy Pad Fitting Instructions (provided with each Cold Therapy Pad) prior to use.       PERIPHERAL NERVE BLOCK PATIENT INFORMATION  Your surgeon has requested a peripheral nerve block for your surgery. This anesthetic technique provides excellent post-operative pain relief for you in a safe and effective manner. It will also help reduce the risk of nausea and vomiting and allow earlier discharge from the hospital.   The block is performed under sedation with ultrasound guidance prior to your procedure. Due to the sedation, your may or may not remember the block experience. The nerve block will begin to take effect anywhere from 5 to 30 minutes after being administered. You will be transported to the operating room from your surgery after the block is completed.   At the end of surgery, when the anesthesia wears off, you will notice a few things. Your may not be able to move or feel the part of your body targeted by the nerve block. These are normal experiences, and they will disappear as the block wears off.  If you had an interscalene nerve block performed (which is common for shoulder surgery), your voice can be very hoarse and you may feel that you are not able to take as deep a breath as you did before surgery. Some patients may also notice a droopy eyelid on the affected side. These symptoms will resolve once the block wears off.  Pain control: The nerve block  technique used is a single injection that can last anywhere from 1-3 days. The duration of the numbness can vary between individuals. After leaving the hospital, it is important that you begin to take your prescribed pain medication when you start to sense the nerve block wearing  off. This will help you avoid unpleasant pain at the time the nerve block wears off, which can sometimes be in the middle of the night. The block will only cover pain in the areas targeted by the nerve block so if you experience surgical pain outside of that area, please take your prescribed pain medication. Management of the "numb area": After a nerve block, you cannot feel pain, pressure, or temperature in the affected area so there is an increased risk for injury. You should take extra care to protect the affected areas until sensation and movement returns. Please take caution to not come in contact with extremely hot or cold items because you will not be able to sense or protect yourself form the extremes of temperature.  You may experience some persistent numbness after the procedure by most neurological deficits resolve over time and the incidence of serious long term neurological complications attributable to peripheral nerve blocks are relatively uncommon.

## 2024-11-03 NOTE — Op Note (Addendum)
 SURGERY DATE: 11/03/2024   PRE-OP DIAGNOSIS:  1. Left subacromial impingement 2. Left biceps tendinopathy 3. Left rotator cuff tear (partial-thickness upper border subscapularis) 4. Left acromioclavicular joint arthritis  POST-OP DIAGNOSIS: 1. Left subacromial impingement 2. Left biceps tendinopathy 3. Left rotator cuff tear (partial-thickness upper border subscapularis) 4. Left acromioclavicular joint arthritis  PROCEDURES:  1. Left arthroscopic rotator cuff repair (partial-thickness upper border subscapularis) 2. Left arthroscopic biceps tenodesis 3. Left arthroscopic subacromial decompression 4. Left arthroscopic extensive debridement of shoulder (glenohumeral and subacromial spaces) 5. Left arthroscopic distal clavicle excision   SURGEON: Earnestine HILARIO Blanch, MD   ASSISTANT: none   ANESTHESIA: Gen with Exparel  interscalene block   ESTIMATED BLOOD LOSS: 5cc   DRAINS:  none   TOTAL IV FLUIDS: per anesthesia      SPECIMENS: none   IMPLANTS:  - Arthrex 2.110mm PushLock x 1 - Arthrex 4.88mm SwiveLock x 1     OPERATIVE FINDINGS:  Examination under anesthesia: A careful examination under anesthesia was performed.  Passive range of motion was: FF: 160; ER at side: 90; ER in abduction: 120; IR in abduction: 45.  Anterior load shift: 1+.  Posterior load shift: 1+.  Sulcus in neutral: 1+.    Intra-operative findings: A thorough arthroscopic examination of the shoulder was performed.  The findings are: 1. Biceps tendon: tendinopathy with mild erythema  2. Superior labrum: erythema 3. Posterior labrum and capsule: normal 4. Inferior capsule and inferior recess: normal 5. Glenoid cartilage surface: Normal 6. Supraspinatus attachment: small, partial-thickness tear affecting approximately 10% of the articular surface 7. Posterior rotator cuff attachment: normal 8. Humeral head articular cartilage: normal 9. Rotator interval: significant synovitis 10: Subscapularis tendon:  Partial-thickness upper border tear 11. Anterior labrum: Mildly degenerative 12. IGHL: normal   OPERATIVE REPORT:    Indications for procedure:  Betty Rodriguez is a 53 y.o. female with over 6 months of shoulder pain.  She has had significant pain that has not responded to appropriate conservative management clinical exam and MRI were suggestive of partial-thickness subscapularis tear, biceps tendinopathy, acromioclavicular joint arthritis with severe bone marrow edema and subacromial impingement. After discussion of risks, benefits, and alternatives to surgery, the patient elected to proceed.    Procedure in detail:   I identified Betty Rodriguez in the pre-operative holding area.  I marked the operative shoulder with my initials. I reviewed the risks and benefits of the proposed surgical intervention, and the patient wished to proceed.  Anesthesia was then performed with an Exparel  interscalene block.  The patient was transferred to the operative suite and placed in the beach chair position.     Appropriate IV antibiotics were administered prior to incision. The operative upper extremity was then prepped and draped in standard fashion. A time out was performed confirming the correct extremity, correct patient, and correct procedure.    I then created a standard posterior portal with an 11 blade. The glenohumeral joint was easily entered with a blunt trocar and the arthroscope introduced. The findings of diagnostic arthroscopy are described above. I debrided degenerative tissue including the synovitic tissue about the rotator interval and anterior and superior labrum.  I also debrided the undersurface of the supraspinatus in the region of partial-thickness tearing with an oscillating shaver.  I then coagulated the inflamed synovium to obtain hemostasis and reduce the risk of post-operative swelling using an Arthrocare radiofrequency device.   I then turned my attention to the arthroscopic  biceps tenodesis. The Loop n Tack technique was used  to pass a FiberTape through the biceps in a locked fashion adjacent to the biceps anchor.  A hole for a 2.9 mm Arthrex PushLock was drilled in the bicipital groove just superior to the subscapularis tendon insertion.  The biceps tendon was then cut and the biceps anchor complex was debrided down to a stable base on the superior labrum.  The FiberTape was loaded onto the PushLock anchor and impacted into place into the previously drilled hole in the bicipital groove.  This appropriately secured the biceps into the bicipital groove and took it off of tension.   Next, arthroscopic repair of the rotator cuff (subscapularis) was performed. The lesser tuberosity footprint was prepared with a combination of electrocautery and an arthroscopic curette.  A suture tape was placed in a mattress fashion using a first pass suture passer.  Both strands of suture were loaded onto an Arthrex 4.75 mm swivel lock anchor.  This was then impacted into place into the lateral portion of the footprint with the arm in neutral rotation. This appropriately reduced the subscapularis tear.  The arm was then internally and externally rotated and the subscapularis was noted to move appropriately with rotation.  The remainder of the suture was then cut.  Next, the arthroscope was then introduced into the subacromial space. A direct lateral portal was created with an 11-blade after spinal needle localization. An extensive subacromial bursectomy and debridement was performed using a combination of the shaver and Arthrocare wand. The entire acromial undersurface was exposed and the CA ligament was subperiosteally elevated to expose the anterior acromial hook. A burr was used to create a flat anterior and lateral aspect of the acromion, converting it from a Type 2 to a Type 1 acromion. Care was made to keep the deltoid fascia intact.  I then turned my attention to the arthroscopic distal  clavicle excision. I identified the acromioclavicular joint. Surrounding bursal tissue was debrided and the edges of the joint were identified. I used the 5.39mm barrel burr to remove the distal clavicle parallel to the edge of the acromion. I was able to fit two widths of the burr into the space between the distal clavicle and acromion, signifying that I had removed ~54mm of distal clavicle. This was confirmed by viewing anteriorly and introducing a probe with measuring marks from the lateral portal. Hemostasis was achieved with an Arthrocare wand.     Fluid was evacuated from the shoulder, and the portals were closed with 3-0 Nylon. Xeroform was applied to the portals. A sterile dressing was applied, followed by a Polar Care sleeve and a SlingShot shoulder immobilizer/sling. The patient was awakened from anesthesia without difficulty and was transferred to the PACU in stable condition.   COMPLICATIONS: none   DISPOSITION: plan for discharge home after recovery in PACU     POSTOPERATIVE PLAN: Remain in sling (except hygiene and elbow/wrist/hand RoM exercises as instructed by PT) x 4 weeks and NWB for this time. PT to begin 3-4 days after surgery.  Small/medium rotator cuff repair with subscapularis restrictions rehab protocol. ASA 325mg  daily x 2 weeks for DVT ppx.

## 2024-11-03 NOTE — Anesthesia Postprocedure Evaluation (Signed)
 Anesthesia Post Note  Patient: Betty Rodriguez  Procedure(s) Performed: DECOMPRESSION, SUBACROMIAL SPACE (Left: Shoulder) TENODESIS, BICEPS (Left: Shoulder) ARTHROSCOPY, SHOULDER (Left: Shoulder)  Patient location during evaluation: PACU Anesthesia Type: General Level of consciousness: awake and alert Pain management: pain level controlled Vital Signs Assessment: post-procedure vital signs reviewed and stable Respiratory status: spontaneous breathing, nonlabored ventilation, respiratory function stable and patient connected to nasal cannula oxygen Cardiovascular status: blood pressure returned to baseline and stable Postop Assessment: no apparent nausea or vomiting Anesthetic complications: no   No notable events documented.   Last Vitals:  Vitals:   11/03/24 1430 11/03/24 1434  BP: (!) 121/91   Pulse: (!) 114 (!) 108  Resp: 20   Temp:    SpO2: 100%     Last Pain:  Vitals:   11/03/24 1430  TempSrc:   PainSc: 0-No pain                 Sheilyn Boehlke C Zakirah Weingart

## 2024-11-03 NOTE — Transfer of Care (Signed)
 Immediate Anesthesia Transfer of Care Note  Patient: Betty Rodriguez  Procedure(s) Performed: DECOMPRESSION, SUBACROMIAL SPACE (Left: Shoulder) TENODESIS, BICEPS (Left: Shoulder) ARTHROSCOPY, SHOULDER (Left: Shoulder)  Patient Location: PACU  Anesthesia Type: General  Level of Consciousness: awake, alert  and patient cooperative  Airway and Oxygen Therapy: Patient Spontanous Breathing and Patient connected to supplemental oxygen  Post-op Assessment: Post-op Vital signs reviewed, Patient's Cardiovascular Status Stable, Respiratory Function Stable, Patent Airway and No signs of Nausea or vomiting  Post-op Vital Signs: Reviewed and stable  Complications: No notable events documented.

## 2024-11-03 NOTE — H&P (Addendum)
 Paper H&P to be scanned into permanent record. H&P reviewed. No significant changes noted other than patient now wishes to proceed with surgery. See below:   10/04/24: At her last visit with me, she was given a glenohumeral joint corticosteroid injection for concern for possible partial-thickness, upper border subscapularis tear.  She was also referred to physical therapy. She did attend multiple sessions over the past month.  She states that she feels that her range of motion has improved.  Her baseline aching pain has also improved with corticosteroid injection.  However, she still having significant point tenderness over the anterior aspect of the shoulder and is feeling very limited with daily activities such as reaching into a cabinet.  This has not improved.     08/30/2024:  Her last visit with me, she obtained an MRI.  She states that her shoulder continues to bother her.  She notes pain sometimes even at rest.  She stopped taking naproxen due to mild GI irritation.  She especially notices weakness with reaching behind her body and across her body as well.  She is also having some difficulty at work with lifting objects.   08/18/24: I last saw the patient approximately 3 months ago.  At that point, she was found to have cervical radiculopathy on the left side.  I recommended that the patient take NSAIDs and had referred her to physical therapy.  She states that those symptoms resolved over the following month after her appointment with me.  She had been doing relatively well until approximately 1-2 weeks ago when she was doing the laundry and felt a pop in her left shoulder.  Since that time, she has had significant catching and clicking sensations as well as sensations of weakness.  She also notes significant pain when she attempts to suddenly move her shoulder.  She has been taking Tylenol  without significant improvement in her symptoms.     05/15/2024: I last saw the patient approximately 2  months ago, at which point she was improving from initial corticosteroid injections for treatment of adhesive capsulitis.  She continued to improve from this and then had an approximately 2-week interval where her shoulder felt relatively normal.  Approximately 1 month ago, she began to develop increased pain about the superior aspect of the shoulder towards the paraspinal musculature of the spine.  She notes that she feels occasional shocks of pain.  She does note occasional neck pain as well.  She feels that her range of motion is close to normal.  Pain is not as severe as her prior shoulder pain.   03/13/24: At the patient's last visit with me, a combined glenohumeral joint subacromial space corticosteroid injection was given for adhesive capsulitis.  Since that time, the patient has noted significant improvement in her pain and range of motion.  She states that the corticosteroid injection took approximately 2 weeks to take effect before her pain eased up.  She is making good progress with regaining her range of motion, although that is not all the way back yet.  She has minimal pain at baseline currently.   02/03/24: Manessa Buley is a 53 y.o. female  referred by Self for L shoulder evaluation and management.  She presents today with a female partner.   Prior medical records were reviewed.  She is recently evaluated by Dr. CHRISTELLA.Emylia Latella in rheumatology on 01/06/2024 for multiple joint pains.  She is found to have positive anti-CCP.  She was then referred to me for further evaluation of  the left shoulder as the pain from that was thought to be not rheumatologic.   She currently rates pain severity as a 4/10. Her symptoms began approximately 3 months ago without known traumatic event.  She states that she did have some mild shoulder pain before that, but it was quite tolerable.  She has had progressively worsening shoulder pain since that time.  She states that her pain is quite miserable.  She has difficulty moving  her shoulder anywhere away from the body.  She has difficulty getting dressed.  She has significant pain at night.   She is right-handed.  She works as a merchandiser, retail at [employer].  She has never smoked.   PMHx, PSurgHx, Fam Hx, Soc Hx, Meds, Allergies: Past Medical History      Past Medical History:  Diagnosis Date   Allergic reaction     AR (allergic rhinitis)     Headaches, cluster     IBS (irritable bowel syndrome)     SVT (supraventricular tachycardia) (HHS-HCC)     Syncope      as a child        Past Surgical History       Past Surgical History:  Procedure Laterality Date   COLONOSCOPY N/A 07/22/2021    Procedure: Colonoscopy;  Surgeon: Geoffrey Eleanor Kubas, MD;  Location: [facility];  Service: Gastroenterology;  Laterality: N/A;        Family History       Family History  Problem Relation Age of Onset   Colon polyps Father     Hyperlipidemia (Elevated cholesterol) Father     Coronary Artery Disease (Blocked arteries around heart) Father     Kidney disease Maternal Grandmother     Diabetes type II Maternal Grandmother     Stroke Maternal Grandmother     Kidney disease Maternal Grandfather     Diabetes type II Maternal Grandfather     Colon cancer Neg Hx          Social History  Social History         Socioeconomic History   Marital status: Single  Tobacco Use   Smoking status: Never      Passive exposure: Never   Smokeless tobacco: Never  Vaping Use   Vaping status: Never Used  Substance and Sexual Activity   Alcohol use: Not Currently   Drug use: No   Sexual activity: Yes    Social Drivers of Acupuncturist Strain: Low Risk  (09/07/2024)    Overall Financial Resource Strain (CARDIA)     Difficulty of Paying Living Expenses: Not hard at all  Food Insecurity: No Food Insecurity (09/07/2024)    Hunger Vital Sign     Worried About Running Out of Food in the Last Year: Never true     Ran Out of Food  in the Last Year: Never true  Transportation Needs: No Transportation Needs (09/07/2024)    PRAPARE - Therapist, Art (Medical): No     Lack of Transportation (Non-Medical): No  Housing Stability: Low Risk  (09/07/2024)    Housing Stability Vital Sign     Unable to Pay for Housing in the Last Year: No     Number of Times Moved in the Last Year: 0     Homeless in the Last Year: No        Current Medications  Current Outpatient Medications  Medication Sig Dispense Refill   metoprolol  succinate (TOPROL -XL) 25 MG XL tablet         naproxen (NAPROSYN) 500 MG tablet Take 1 tablet (500 mg total) by mouth 2 (two) times daily with meals for 60 days 60 tablet 1   valACYclovir  (VALTREX ) 500 MG tablet          No current facility-administered medications for this visit.        Allergies       Allergies  Allergen Reactions   Amlodipine  Hives and Shortness Of Breath   Sertraline  Hives      SVT   Sulfa (Sulfonamide Antibiotics) Hives and Shortness Of Breath        Review of Systems: A 10+ ROS was performed, reviewed, and the pertinent orthopaedic findings are documented in the HPI.  I have reviewed and agree with the ROS captured by the CMA.     Physical Exam: There were no vitals filed for this visit.   General/Constitutional: NAD, conversant Eyes: Pupils equal and round, extraocular movements intact ENT: atraumatic external nose and ears, moist mucous membranes Respiratory: non-labored breathing, symmetric chest rise, chest sounds clear Cardiovascular: no visible lower extremity edema, peripheral pulses present, regular rate/rhythm  Skin: normal skin turgor, warm and dry Neurological: cranial nerves grossly intact, sensation grossly intact Psychological:  Appropriate mood and affect; appropriate judgment Musculoskeletal: as detailed below:   Comprehensive Shoulder Exam:   ROM   Right Left  Active (Passive) Forward Elevation  160 150  ER 90 80   IR T4 T8  90 degree abduction ER/IR 110/60 100/40  Abduction 130 110  Capsulitis Negative negative                                                                                       Tenderness                                         Right Left    Significant over lesser tuberosity and biceps    Inspection   Right Left  Skin Normal Normal  Scapular Kinetics      Atrophy        Impingement / Rotator Cuff   Right Left  Neer Impingement No no  Hawkins No no  Champagne Toast       Empty Can/Jobe's 5/5 5/5  ER Strength 5/5 5/5  Bear Hug 5/5 4+/5  Belly Press normal abnormal  Lift off Test normal abnormal  ER Lag No No    AC / Biceps  / SLAP   Right Left  Speed's   negative  Yergason's   negative  O'Brien's   Positive  Cross Arm Adduction   negative      Neurovascular   Right Left  Distal Motor Normal Normal  Distal Sensation Normal Normal  Distal Pulse Normal Normal      Imaging:   L Shoulder radiographs:  12/22/2023 ([facility]):  On personal read, there are no fractures or dislocations.  There are no significant degenerative changes to  the glenohumeral joint.  There are mild degenerative changes to the acromioclavicular joint.     L Shoulder MRI: 08/28/24:  FINDINGS:  . Bones: No evidence of fracture, aggressive bone lesion, or osteonecrosis. Mild subcortical cystic changes at the greater tuberosity.  . Soft tissues: Minimal subacromial-subdeltoid bursa fluid.  . Glenohumeral joint: No evidence of malalignment or joint effusion.  . Acromioclavicular joint: Moderate arthrosis with joint space narrowing and hypertrophy.  . Articular cartilage: Intact.  .  . Supraspinatus tendon: Mild tendinosis without a tear  . Infraspinatus tendon: Mild tendinosis without a tear  . Subscapularis tendon: Mild tendinosis without a tear  . Teres minor tendon: Intact.  .  . Long head of biceps tendon: Intact.  .  SABRA Leos structures: Mild signal changes along the  glenohumeral capsule. No discrete labral tear.  .  . Additional shoulder girdle muscles: Intact.    IMPRESSION:  Mild diffuse rotator cuff tendinosis without focal tears.   Moderate AC joint arthrosis.   Findings suggesting adhesive capsulitis in the appropriate clinical setting.        On personal read, pertinent positive findings include severe bone marrow edema about the [facility] joint/distal clavicle.  There also appears to be partial-thickness, upper border tearing of the subscapularis.  Supraspinatus and infraspinatus appear intact.  There is no significant muscle atrophy.  There is mild biceps tendinopathy.  No significant degenerative changes to the glenohumeral joint noted.     I personally reviewed and visualized the aforementioned imaging studies. I additionally personally interpreted any radiographs taken during today's visit.     Assessment & Plan: There are no diagnoses linked to this encounter.       Hopie Pellegrin is a 53 y.o. female patient with L shoulder adhesive capsulitis that has resolved.  Cervical spine symptoms have resolved.  She now has increased left shoulder pain and weakness, with MRI concerning for partial-thickness upper border subscapularis tear with associated biceps tendon pathology. 1.  We discussed the possible diagnoses as well as various treatment options based on her symptoms and clinical exam as well as imaging findings.  We discussed treatment options at this point could include further physical therapy versus surgical intervention.  Surgical invention would likely consist of left shoulder arthroscopic subscapularis repair and biceps tenodesis with possible subacromial decompression.  We discussed the risk, benefits, and alternatives to surgery.  We also discussed the postoperative rehab process.     2. Patient states that she would like to think about her options.  She will contact our offices with how she would like to proceed.

## 2024-11-07 ENCOUNTER — Encounter: Payer: Self-pay | Admitting: Orthopedic Surgery

## 2024-11-28 ENCOUNTER — Ambulatory Visit: Admitting: Clinical

## 2024-12-12 ENCOUNTER — Ambulatory Visit: Admitting: Clinical

## 2025-01-03 ENCOUNTER — Ambulatory Visit: Admitting: Clinical

## 2025-02-06 ENCOUNTER — Ambulatory Visit: Admitting: Clinical

## 2025-06-14 ENCOUNTER — Encounter: Admitting: Family Medicine
